# Patient Record
Sex: Male | Born: 1945 | Race: White | Hispanic: No | Marital: Married | Smoking: Former smoker
Health system: Southern US, Community
[De-identification: ages and names within clinical notes are randomized; demographics above are authoritative.]

## PROBLEM LIST (undated history)

## (undated) DIAGNOSIS — K649 Unspecified hemorrhoids: Secondary | ICD-10-CM

## (undated) DIAGNOSIS — T300 Burn of unspecified body region, unspecified degree: Secondary | ICD-10-CM

## (undated) DIAGNOSIS — Z87442 Personal history of urinary calculi: Secondary | ICD-10-CM

## (undated) DIAGNOSIS — G4730 Sleep apnea, unspecified: Secondary | ICD-10-CM

## (undated) DIAGNOSIS — M4310 Spondylolisthesis, site unspecified: Secondary | ICD-10-CM

## (undated) DIAGNOSIS — R112 Nausea with vomiting, unspecified: Secondary | ICD-10-CM

## (undated) DIAGNOSIS — M109 Gout, unspecified: Secondary | ICD-10-CM

## (undated) DIAGNOSIS — Z9889 Other specified postprocedural states: Secondary | ICD-10-CM

## (undated) DIAGNOSIS — C801 Malignant (primary) neoplasm, unspecified: Secondary | ICD-10-CM

## (undated) DIAGNOSIS — R7303 Prediabetes: Secondary | ICD-10-CM

## (undated) DIAGNOSIS — O2340 Unspecified infection of urinary tract in pregnancy, unspecified trimester: Secondary | ICD-10-CM

## (undated) DIAGNOSIS — K219 Gastro-esophageal reflux disease without esophagitis: Secondary | ICD-10-CM

## (undated) DIAGNOSIS — M48061 Spinal stenosis, lumbar region without neurogenic claudication: Secondary | ICD-10-CM

## (undated) DIAGNOSIS — M419 Scoliosis, unspecified: Secondary | ICD-10-CM

## (undated) DIAGNOSIS — N400 Enlarged prostate without lower urinary tract symptoms: Secondary | ICD-10-CM

## (undated) DIAGNOSIS — T8859XA Other complications of anesthesia, initial encounter: Secondary | ICD-10-CM

## (undated) DIAGNOSIS — M719 Bursopathy, unspecified: Secondary | ICD-10-CM

## (undated) DIAGNOSIS — H9190 Unspecified hearing loss, unspecified ear: Secondary | ICD-10-CM

## (undated) DIAGNOSIS — Z8719 Personal history of other diseases of the digestive system: Secondary | ICD-10-CM

## (undated) DIAGNOSIS — M5116 Intervertebral disc disorders with radiculopathy, lumbar region: Secondary | ICD-10-CM

## (undated) DIAGNOSIS — R7302 Impaired glucose tolerance (oral): Secondary | ICD-10-CM

## (undated) DIAGNOSIS — Z86711 Personal history of pulmonary embolism: Secondary | ICD-10-CM

## (undated) DIAGNOSIS — R0602 Shortness of breath: Secondary | ICD-10-CM

## (undated) DIAGNOSIS — M2550 Pain in unspecified joint: Secondary | ICD-10-CM

## (undated) DIAGNOSIS — E669 Obesity, unspecified: Secondary | ICD-10-CM

## (undated) DIAGNOSIS — E785 Hyperlipidemia, unspecified: Secondary | ICD-10-CM

## (undated) DIAGNOSIS — M21379 Foot drop, unspecified foot: Secondary | ICD-10-CM

## (undated) DIAGNOSIS — D4100 Neoplasm of uncertain behavior of unspecified kidney: Secondary | ICD-10-CM

## (undated) DIAGNOSIS — M1990 Unspecified osteoarthritis, unspecified site: Secondary | ICD-10-CM

## (undated) DIAGNOSIS — R350 Frequency of micturition: Secondary | ICD-10-CM

## (undated) DIAGNOSIS — T4145XA Adverse effect of unspecified anesthetic, initial encounter: Secondary | ICD-10-CM

## (undated) DIAGNOSIS — R001 Bradycardia, unspecified: Secondary | ICD-10-CM

## (undated) DIAGNOSIS — N200 Calculus of kidney: Secondary | ICD-10-CM

## (undated) DIAGNOSIS — D4120 Neoplasm of uncertain behavior of unspecified ureter: Secondary | ICD-10-CM

## (undated) DIAGNOSIS — I10 Essential (primary) hypertension: Secondary | ICD-10-CM

## (undated) DIAGNOSIS — G629 Polyneuropathy, unspecified: Secondary | ICD-10-CM

## (undated) HISTORY — PX: HERNIA REPAIR: SHX51

## (undated) HISTORY — PX: TOE AMPUTATION: SHX809

## (undated) HISTORY — PX: TONSILLECTOMY: SUR1361

## (undated) HISTORY — PX: CATARACT EXTRACTION: SUR2

## (undated) HISTORY — PX: SPINAL FUSION: SHX223

## (undated) HISTORY — PX: DIAGNOSTIC LAPAROSCOPY: SUR761

## (undated) HISTORY — PX: LAMINECTOMY: SHX219

## (undated) HISTORY — PX: FEMORAL ARTERY REPAIR: SHX1582

## (undated) HISTORY — PX: LITHOTRIPSY: SUR834

## (undated) HISTORY — PX: OTHER SURGICAL HISTORY: SHX169

## (undated) HISTORY — PX: JOINT REPLACEMENT: SHX530

---

## 2004-06-13 ENCOUNTER — Ambulatory Visit: Payer: Self-pay | Admitting: Family Medicine

## 2005-03-07 ENCOUNTER — Ambulatory Visit: Payer: Self-pay | Admitting: Ophthalmology

## 2005-03-21 ENCOUNTER — Ambulatory Visit: Payer: Self-pay | Admitting: Internal Medicine

## 2005-04-05 ENCOUNTER — Ambulatory Visit: Payer: Self-pay | Admitting: Urology

## 2005-08-01 ENCOUNTER — Ambulatory Visit: Payer: Self-pay | Admitting: Family Medicine

## 2005-09-19 ENCOUNTER — Emergency Department: Payer: Self-pay | Admitting: Emergency Medicine

## 2006-01-23 ENCOUNTER — Other Ambulatory Visit: Payer: Self-pay

## 2006-01-28 ENCOUNTER — Ambulatory Visit: Payer: Self-pay | Admitting: Podiatry

## 2006-12-09 ENCOUNTER — Ambulatory Visit: Payer: Self-pay | Admitting: Podiatry

## 2006-12-09 ENCOUNTER — Other Ambulatory Visit: Payer: Self-pay

## 2006-12-13 ENCOUNTER — Ambulatory Visit: Payer: Self-pay | Admitting: Podiatry

## 2007-06-12 ENCOUNTER — Emergency Department: Payer: Self-pay | Admitting: Internal Medicine

## 2007-09-25 ENCOUNTER — Ambulatory Visit: Payer: Self-pay | Admitting: Gastroenterology

## 2007-10-09 ENCOUNTER — Ambulatory Visit: Payer: Self-pay | Admitting: Podiatry

## 2007-10-22 ENCOUNTER — Ambulatory Visit: Payer: Self-pay | Admitting: Podiatry

## 2008-01-21 ENCOUNTER — Ambulatory Visit: Payer: Self-pay | Admitting: Podiatry

## 2008-01-23 ENCOUNTER — Ambulatory Visit: Payer: Self-pay | Admitting: Podiatry

## 2009-07-11 ENCOUNTER — Ambulatory Visit: Payer: Self-pay | Admitting: Orthopedic Surgery

## 2009-07-14 ENCOUNTER — Ambulatory Visit: Payer: Self-pay | Admitting: Orthopedic Surgery

## 2009-12-17 ENCOUNTER — Emergency Department: Payer: Self-pay | Admitting: Emergency Medicine

## 2009-12-19 ENCOUNTER — Emergency Department: Payer: Self-pay | Admitting: Emergency Medicine

## 2010-07-02 ENCOUNTER — Ambulatory Visit: Payer: Self-pay | Admitting: Rheumatology

## 2010-08-17 ENCOUNTER — Ambulatory Visit: Payer: Self-pay | Admitting: Pain Medicine

## 2010-08-23 ENCOUNTER — Ambulatory Visit: Payer: Self-pay | Admitting: Urology

## 2010-08-24 ENCOUNTER — Ambulatory Visit: Payer: Self-pay | Admitting: Urology

## 2010-09-04 ENCOUNTER — Ambulatory Visit: Payer: Self-pay | Admitting: Pain Medicine

## 2010-09-12 ENCOUNTER — Ambulatory Visit: Payer: Self-pay | Admitting: Pain Medicine

## 2010-09-26 ENCOUNTER — Ambulatory Visit: Payer: Self-pay | Admitting: Pain Medicine

## 2010-10-09 ENCOUNTER — Ambulatory Visit: Payer: Self-pay | Admitting: Pain Medicine

## 2012-02-12 DIAGNOSIS — G621 Alcoholic polyneuropathy: Secondary | ICD-10-CM | POA: Insufficient documentation

## 2012-09-09 ENCOUNTER — Ambulatory Visit: Payer: Self-pay | Admitting: Family Medicine

## 2012-11-06 DIAGNOSIS — K219 Gastro-esophageal reflux disease without esophagitis: Secondary | ICD-10-CM | POA: Insufficient documentation

## 2012-11-11 DIAGNOSIS — M48061 Spinal stenosis, lumbar region without neurogenic claudication: Secondary | ICD-10-CM | POA: Insufficient documentation

## 2013-07-02 ENCOUNTER — Ambulatory Visit: Payer: Self-pay

## 2013-07-02 LAB — GLUCOSE, 2 HOUR
Glucose 2 Hour: 116 mg/dL
Glucose Fasting: 115 mg/dL — ABNORMAL HIGH (ref 70–110)

## 2013-07-02 LAB — TSH: Thyroid Stimulating Horm: 1.94 u[IU]/mL

## 2014-03-25 DIAGNOSIS — M21372 Foot drop, left foot: Secondary | ICD-10-CM | POA: Insufficient documentation

## 2014-04-11 DIAGNOSIS — Z86711 Personal history of pulmonary embolism: Secondary | ICD-10-CM | POA: Insufficient documentation

## 2014-04-11 DIAGNOSIS — E785 Hyperlipidemia, unspecified: Secondary | ICD-10-CM | POA: Insufficient documentation

## 2014-04-11 DIAGNOSIS — M109 Gout, unspecified: Secondary | ICD-10-CM | POA: Insufficient documentation

## 2014-04-11 DIAGNOSIS — N200 Calculus of kidney: Secondary | ICD-10-CM | POA: Insufficient documentation

## 2014-04-11 DIAGNOSIS — N400 Enlarged prostate without lower urinary tract symptoms: Secondary | ICD-10-CM | POA: Diagnosis present

## 2014-04-11 DIAGNOSIS — E538 Deficiency of other specified B group vitamins: Secondary | ICD-10-CM | POA: Insufficient documentation

## 2014-04-11 DIAGNOSIS — I10 Essential (primary) hypertension: Secondary | ICD-10-CM | POA: Diagnosis present

## 2014-09-07 ENCOUNTER — Ambulatory Visit: Payer: Self-pay | Admitting: Family Medicine

## 2015-03-16 DIAGNOSIS — G4733 Obstructive sleep apnea (adult) (pediatric): Secondary | ICD-10-CM | POA: Diagnosis present

## 2015-07-29 ENCOUNTER — Encounter: Payer: Self-pay | Admitting: *Deleted

## 2015-08-01 ENCOUNTER — Ambulatory Visit: Payer: Medicare Other | Admitting: Certified Registered Nurse Anesthetist

## 2015-08-01 ENCOUNTER — Encounter: Payer: Self-pay | Admitting: *Deleted

## 2015-08-01 ENCOUNTER — Encounter
Admission: RE | Disposition: A | Discharge status: Home Health | Payer: Self-pay | Source: Ambulatory Visit | Attending: Gastroenterology

## 2015-08-01 ENCOUNTER — Ambulatory Visit
Admission: RE | Admit: 2015-08-01 | Discharge: 2015-08-01 | Disposition: A | Discharge status: Home Health | Payer: Medicare Other | Source: Ambulatory Visit | Attending: Gastroenterology | Admitting: Gastroenterology

## 2015-08-01 DIAGNOSIS — E785 Hyperlipidemia, unspecified: Secondary | ICD-10-CM | POA: Diagnosis not present

## 2015-08-01 DIAGNOSIS — M419 Scoliosis, unspecified: Secondary | ICD-10-CM | POA: Insufficient documentation

## 2015-08-01 DIAGNOSIS — I10 Essential (primary) hypertension: Secondary | ICD-10-CM | POA: Insufficient documentation

## 2015-08-01 DIAGNOSIS — Z9889 Other specified postprocedural states: Secondary | ICD-10-CM | POA: Diagnosis not present

## 2015-08-01 DIAGNOSIS — K640 First degree hemorrhoids: Secondary | ICD-10-CM | POA: Insufficient documentation

## 2015-08-01 DIAGNOSIS — M1991 Primary osteoarthritis, unspecified site: Secondary | ICD-10-CM | POA: Insufficient documentation

## 2015-08-01 DIAGNOSIS — Z87891 Personal history of nicotine dependence: Secondary | ICD-10-CM | POA: Diagnosis not present

## 2015-08-01 DIAGNOSIS — Z79899 Other long term (current) drug therapy: Secondary | ICD-10-CM | POA: Diagnosis not present

## 2015-08-01 DIAGNOSIS — Z7951 Long term (current) use of inhaled steroids: Secondary | ICD-10-CM | POA: Insufficient documentation

## 2015-08-01 DIAGNOSIS — Z1211 Encounter for screening for malignant neoplasm of colon: Secondary | ICD-10-CM | POA: Insufficient documentation

## 2015-08-01 DIAGNOSIS — K635 Polyp of colon: Secondary | ICD-10-CM | POA: Insufficient documentation

## 2015-08-01 DIAGNOSIS — Z86010 Personal history of colonic polyps: Secondary | ICD-10-CM | POA: Diagnosis present

## 2015-08-01 DIAGNOSIS — E669 Obesity, unspecified: Secondary | ICD-10-CM | POA: Insufficient documentation

## 2015-08-01 DIAGNOSIS — K219 Gastro-esophageal reflux disease without esophagitis: Secondary | ICD-10-CM | POA: Insufficient documentation

## 2015-08-01 DIAGNOSIS — K5730 Diverticulosis of large intestine without perforation or abscess without bleeding: Secondary | ICD-10-CM | POA: Insufficient documentation

## 2015-08-01 DIAGNOSIS — M109 Gout, unspecified: Secondary | ICD-10-CM | POA: Diagnosis not present

## 2015-08-01 DIAGNOSIS — Z981 Arthrodesis status: Secondary | ICD-10-CM | POA: Insufficient documentation

## 2015-08-01 DIAGNOSIS — N400 Enlarged prostate without lower urinary tract symptoms: Secondary | ICD-10-CM | POA: Insufficient documentation

## 2015-08-01 HISTORY — DX: Scoliosis, unspecified: M41.9

## 2015-08-01 HISTORY — PX: COLONOSCOPY WITH PROPOFOL: SHX5780

## 2015-08-01 HISTORY — DX: Gout, unspecified: M10.9

## 2015-08-01 HISTORY — DX: Unspecified hemorrhoids: K64.9

## 2015-08-01 HISTORY — DX: Benign prostatic hyperplasia without lower urinary tract symptoms: N40.0

## 2015-08-01 HISTORY — DX: Obesity, unspecified: E66.9

## 2015-08-01 HISTORY — DX: Essential (primary) hypertension: I10

## 2015-08-01 HISTORY — DX: Unspecified hearing loss, unspecified ear: H91.90

## 2015-08-01 HISTORY — DX: Gastro-esophageal reflux disease without esophagitis: K21.9

## 2015-08-01 HISTORY — DX: Hyperlipidemia, unspecified: E78.5

## 2015-08-01 HISTORY — DX: Unspecified osteoarthritis, unspecified site: M19.90

## 2015-08-01 SURGERY — COLONOSCOPY WITH PROPOFOL
Anesthesia: General

## 2015-08-01 MED ORDER — LIDOCAINE HCL (CARDIAC) 20 MG/ML IV SOLN
INTRAVENOUS | Status: DC | PRN
  Administered 2015-08-01: 40 mg via INTRAVENOUS

## 2015-08-01 MED ORDER — SODIUM CHLORIDE 0.9 % IV SOLN
INTRAVENOUS | Status: DC
  Administered 2015-08-01: 13:00:00 via INTRAVENOUS

## 2015-08-01 MED ORDER — PROPOFOL 10 MG/ML IV BOLUS
INTRAVENOUS | Status: DC | PRN
  Administered 2015-08-01: 10 mg via INTRAVENOUS
  Administered 2015-08-01: 50 mg via INTRAVENOUS

## 2015-08-01 MED ORDER — PROPOFOL 500 MG/50ML IV EMUL
INTRAVENOUS | Status: DC | PRN
  Administered 2015-08-01: 140 ug/kg/min via INTRAVENOUS

## 2015-08-01 NOTE — Anesthesia Preprocedure Evaluation (Addendum)
Anesthesia Evaluation  Patient identified by MRN, date of birth, ID band Patient awake    Reviewed: Allergy & Precautions, NPO status   Airway Mallampati: III  TM Distance: >3 FB Neck ROM: Limited    Dental  (+) Teeth Intact   Pulmonary former smoker,    breath sounds clear to auscultation       Cardiovascular Exercise Tolerance: Good hypertension, Pt. on medications  Rhythm:Regular Rate:Abnormal     Neuro/Psych    GI/Hepatic GERD  Medicated and Controlled,  Endo/Other    Renal/GU      Musculoskeletal  (+) Arthritis , Osteoarthritis,    Abdominal (+) + obese,   Peds  Hematology   Anesthesia Other Findings 114 kg, BMI 37.  Reproductive/Obstetrics                            Anesthesia Physical Anesthesia Plan  ASA: II  Anesthesia Plan: General   Post-op Pain Management:    Induction: Intravenous  Airway Management Planned: Nasal Cannula  Additional Equipment:   Intra-op Plan:   Post-operative Plan:   Informed Consent: I have reviewed the patients History and Physical, chart, labs and discussed the procedure including the risks, benefits and alternatives for the proposed anesthesia with the patient or authorized representative who has indicated his/her understanding and acceptance.     Plan Discussed with: CRNA  Anesthesia Plan Comments:        Anesthesia Quick Evaluation

## 2015-08-01 NOTE — Anesthesia Postprocedure Evaluation (Signed)
Anesthesia Post Note  Patient: Thomas Mcdaniel  Procedure(s) Performed: Procedure(s) (LRB): COLONOSCOPY WITH PROPOFOL (N/A)  Patient location during evaluation: PACU Anesthesia Type: General Level of consciousness: awake Pain management: pain level controlled Vital Signs Assessment: post-procedure vital signs reviewed and stable Respiratory status: spontaneous breathing Cardiovascular status: blood pressure returned to baseline Postop Assessment: no headache Anesthetic complications: no    Last Vitals:  Filed Vitals:   08/01/15 1245 08/01/15 1353  BP: 154/90 90/68  Pulse: 62 74  Temp: 36.6 C 36.2 C  Resp: 14 19    Last Pain: There were no vitals filed for this visit.               Weslyn Holsonback M

## 2015-08-01 NOTE — H&P (Signed)
Primary Care Physician:  Valera Castle, MD  Pre-Procedure History & Physical: HPI:  Thomas Mcdaniel is a 70 y.o. male is here for an colonoscopy.   Past Medical History  Diagnosis Date  . BPH (benign prostatic hyperplasia)   . DJD (degenerative joint disease)   . GERD (gastroesophageal reflux disease)   . Gout   . Hard of hearing   . Hemorrhoids   . Hyperlipidemia   . Hypertension   . Obesity   . Scoliosis     Past Surgical History  Procedure Laterality Date  . Laminectomy    . Femoral artery repair    . Cataract extraction    . Toe amputation    . Spinal fusion    . Tonsillectomy    . Lithotripsy    . Knee arthroscopy      Prior to Admission medications   Medication Sig Start Date End Date Taking? Authorizing Provider  ALPHA LIPOIC ACID PO Take by mouth.   Yes Historical Provider, MD  amLODipine (NORVASC) 10 MG tablet Take 10 mg by mouth daily.   Yes Historical Provider, MD  colchicine 0.6 MG tablet Take 0.6 mg by mouth daily.   Yes Historical Provider, MD  famotidine (PEPCID) 40 MG tablet Take 40 mg by mouth daily.   Yes Historical Provider, MD  finasteride (PROSCAR) 5 MG tablet Take 5 mg by mouth daily.   Yes Historical Provider, MD  lisinopril (PRINIVIL,ZESTRIL) 40 MG tablet Take 40 mg by mouth daily.   Yes Historical Provider, MD  psyllium (METAMUCIL) 58.6 % powder Take 1 packet by mouth 3 (three) times daily.   Yes Historical Provider, MD  vitamin B-12 (CYANOCOBALAMIN) 1000 MCG tablet Take 1,000 mcg by mouth daily.   Yes Historical Provider, MD  fluticasone (FLONASE) 50 MCG/ACT nasal spray Place into both nostrils daily. Reported on 08/01/2015    Historical Provider, MD  Ketoconazole (XOLEGEL EX) Apply topically. Reported on 08/01/2015    Historical Provider, MD    Allergies as of 07/13/2015  . (Not on File)    History reviewed. No pertinent family history.  Social History   Social History  . Marital Status: Married    Spouse Name: N/A  . Number of  Children: N/A  . Years of Education: N/A   Occupational History  . Not on file.   Social History Main Topics  . Smoking status: Former Smoker    Quit date: 07/31/1992  . Smokeless tobacco: Never Used  . Alcohol Use: No  . Drug Use: No  . Sexual Activity: Not on file   Other Topics Concern  . Not on file   Social History Narrative     Physical Exam: BP 154/90 mmHg  Pulse 62  Temp(Src) 97.9 F (36.6 C) (Tympanic)  Resp 14  Ht 5\' 9"  (1.753 m)  Wt 113.399 kg (250 lb)  BMI 36.90 kg/m2  SpO2 97% General:   Alert,  pleasant and cooperative in NAD Head:  Normocephalic and atraumatic. Neck:  Supple; no masses or thyromegaly. Lungs:  Clear throughout to auscultation.    Heart:  Regular rate and rhythm. Abdomen:  Soft, nontender and nondistended. Normal bowel sounds, without guarding, and without rebound.   Neurologic:  Alert and  oriented x4;  grossly normal neurologically.  Impression/Plan: Thomas Mcdaniel is here for an colonoscopy to be performed for pers hx polys  Risks, benefits, limitations, and alternatives regarding  colonoscopy have been reviewed with the patient.  Questions have been answered.  All parties agreeable.   Josefine Class, MD  08/01/2015, 1:20 PM

## 2015-08-01 NOTE — Transfer of Care (Signed)
Immediate Anesthesia Transfer of Care Note  Patient: Thomas Mcdaniel  Procedure(s) Performed: Procedure(s): COLONOSCOPY WITH PROPOFOL (N/A)  Patient Location: PACU  Anesthesia Type:General  Level of Consciousness: sedated  Airway & Oxygen Therapy: Patient Spontanous Breathing and Patient connected to nasal cannula oxygen  Post-op Assessment: Report given to RN and Post -op Vital signs reviewed and stable  Post vital signs: Reviewed and stable  Last Vitals:  Filed Vitals:   08/01/15 1245  BP: 154/90  Pulse: 62  Temp: 36.6 C  Resp: 14    Complications: No apparent anesthesia complications

## 2015-08-01 NOTE — Discharge Instructions (Signed)

## 2015-08-01 NOTE — Anesthesia Procedure Notes (Signed)
Performed by: Lindsey Hommel Pre-anesthesia Checklist: Patient identified, Emergency Drugs available, Suction available, Patient being monitored and Timeout performed Patient Re-evaluated:Patient Re-evaluated prior to inductionOxygen Delivery Method: Nasal cannula Intubation Type: IV induction       

## 2015-08-01 NOTE — Op Note (Signed)
[facility] Patient Name: Thomas Mcdaniel Procedure Date: 08/01/2015 1:18 PM MRN: [id number] Account #: [id number] Date of Birth: 08-08-1945 Admit Type: Outpatient Age: 70 Room: [facility] ENDO ROOM 2 Gender: Male Note Status: Finalized Procedure:         Colonoscopy Indications:       Surveillance: Personal history of colonic polyps (unknown                     histology) on last colonoscopy 5 years ago Patient Profile:   This is a 70 year old male. Providers:         Gerrit Heck. Rayann Heman, MD Referring MD:      Wynona Canes. Kym Groom, MD (Referring MD) Medicines:         Propofol per Anesthesia Complications:     No immediate complications. Procedure:         Pre-Anesthesia Assessment:                    - Prior to the procedure, a History and Physical was                     performed, and patient medications, allergies and                     sensitivities were reviewed. The patient's tolerance of                     previous anesthesia was reviewed.                    After obtaining informed consent, the colonoscope was                     passed under direct vision. Throughout the procedure, the                     patient's blood pressure, pulse, and oxygen saturations                     were monitored continuously. The Olympus CF-H180AL                     colonoscope ( S#: [id number] ) was introduced through the                     anus and advanced to the the cecum, identified by                     appendiceal orifice and ileocecal valve. The colonoscopy                     was performed without difficulty. The patient tolerated                     the procedure well. The quality of the bowel preparation                     was good. Findings:      The perianal and digital rectal examinations were normal.      Two sessile polyps were found in the recto-sigmoid colon. The polyps       were 2 to 3 mm in size. These polyps were removed with a jumbo cold        forceps. Resection and retrieval were complete.  Many small and large-mouthed diverticula were found in the sigmoid colon       and in the descending colon.      Internal hemorrhoids were found during retroflexion. The hemorrhoids       were Grade I (internal hemorrhoids that do not prolapse). Impression:        - Two 2 to 3 mm polyps at the recto-sigmoid colon.                     Resected and retrieved.                    - Diverticulosis in the sigmoid colon and in the                     descending colon.                    - Internal hemorrhoids. Recommendation:    - Observe patient in GI recovery unit.                    - High fiber diet.                    - Continue present medications.                    - Await pathology results.                    - Repeat colonoscopy for surveillance based on pathology                     results.                    - Return to referring physician.                    - The findings and recommendations were discussed with the                     patient.                    - The findings and recommendations were discussed with the                     patient's family. Procedure Code(s): --- Professional ---                    [phone], Colonoscopy, flexible; with biopsy, single or                     multiple Diagnosis Code(s): --- Professional ---                    Z86.010, Personal history of colonic polyps                    D12.7, Benign neoplasm of rectosigmoid junction                    K64.0, First degree hemorrhoids                    K57.30, Diverticulosis of large intestine without                     perforation or abscess without bleeding CPT copyright 2014 [place]  [employer]. All rights reserved. The codes documented in this report are preliminary and upon coder review may  be revised to meet current compliance requirements. Mellody Life, MD 08/01/2015 1:48:53 PM This report has been signed  electronically. Number of Addenda: 0 Note Initiated On: 08/01/2015 1:18 PM Scope Withdrawal Time: 0 hours 10 minutes 0 seconds  Total Procedure Duration: 0 hours 18 minutes 58 seconds       [facility]

## 2015-08-02 ENCOUNTER — Encounter: Payer: Self-pay | Admitting: Gastroenterology

## 2015-08-02 LAB — SURGICAL PATHOLOGY

## 2015-08-22 DIAGNOSIS — R7303 Prediabetes: Secondary | ICD-10-CM | POA: Diagnosis present

## 2015-11-23 DIAGNOSIS — Z01812 Encounter for preprocedural laboratory examination: Secondary | ICD-10-CM

## 2015-11-23 DIAGNOSIS — Z79899 Other long term (current) drug therapy: Secondary | ICD-10-CM | POA: Diagnosis not present

## 2015-11-23 DIAGNOSIS — Z0183 Encounter for blood typing: Secondary | ICD-10-CM | POA: Diagnosis not present

## 2015-11-23 DIAGNOSIS — E785 Hyperlipidemia, unspecified: Secondary | ICD-10-CM | POA: Diagnosis not present

## 2015-11-23 DIAGNOSIS — I10 Essential (primary) hypertension: Secondary | ICD-10-CM

## 2015-11-23 DIAGNOSIS — Z01818 Encounter for other preprocedural examination: Secondary | ICD-10-CM | POA: Diagnosis present

## 2015-11-23 DIAGNOSIS — M1711 Unilateral primary osteoarthritis, right knee: Secondary | ICD-10-CM | POA: Diagnosis not present

## 2015-11-23 DIAGNOSIS — Z86711 Personal history of pulmonary embolism: Secondary | ICD-10-CM | POA: Diagnosis not present

## 2015-11-23 DIAGNOSIS — M109 Gout, unspecified: Secondary | ICD-10-CM | POA: Insufficient documentation

## 2015-11-23 DIAGNOSIS — E669 Obesity, unspecified: Secondary | ICD-10-CM

## 2015-11-23 LAB — URINALYSIS COMPLETE WITH MICROSCOPIC (ARMC ONLY)
BILIRUBIN URINE: NEGATIVE
Bacteria, UA: NONE SEEN
GLUCOSE, UA: NEGATIVE mg/dL
KETONES UR: NEGATIVE mg/dL
Leukocytes, UA: NEGATIVE
NITRITE: NEGATIVE
PH: 5 (ref 5.0–8.0)
Protein, ur: NEGATIVE mg/dL
Specific Gravity, Urine: 1.019 (ref 1.005–1.030)

## 2015-11-23 LAB — SURGICAL PCR SCREEN
MRSA, PCR: NEGATIVE
Staphylococcus aureus: NEGATIVE

## 2015-11-23 LAB — CBC
HEMATOCRIT: 45.1 % (ref 40.0–52.0)
Hemoglobin: 15.4 g/dL (ref 13.0–18.0)
MCH: 31.1 pg (ref 26.0–34.0)
MCHC: 34 g/dL (ref 32.0–36.0)
MCV: 91.3 fL (ref 80.0–100.0)
Platelets: 168 10*3/uL (ref 150–440)
RBC: 4.94 MIL/uL (ref 4.40–5.90)
RDW: 14 % (ref 11.5–14.5)
WBC: 5.6 10*3/uL (ref 3.8–10.6)

## 2015-11-23 LAB — COMPREHENSIVE METABOLIC PANEL
ALK PHOS: 54 U/L (ref 38–126)
ALT: 21 U/L (ref 17–63)
AST: 26 U/L (ref 15–41)
Albumin: 4.2 g/dL (ref 3.5–5.0)
Anion gap: 7 (ref 5–15)
BILIRUBIN TOTAL: 0.6 mg/dL (ref 0.3–1.2)
BUN: 15 mg/dL (ref 6–20)
CALCIUM: 9.6 mg/dL (ref 8.9–10.3)
CO2: 27 mmol/L (ref 22–32)
CREATININE: 0.9 mg/dL (ref 0.61–1.24)
Chloride: 108 mmol/L (ref 101–111)
GFR calc Af Amer: >60 mL/min (ref >60)
Glucose, Bld: 168 mg/dL — ABNORMAL HIGH (ref 65–99)
Potassium: 3.9 mmol/L (ref 3.5–5.1)
Sodium: 142 mmol/L (ref 135–145)
TOTAL PROTEIN: 7 g/dL (ref 6.5–8.1)

## 2015-11-23 LAB — TYPE AND SCREEN
ABO/RH(D): O POS
Antibody Screen: NEGATIVE

## 2015-11-23 LAB — PROTIME-INR
INR: 1.12
PROTHROMBIN TIME: 14.6 s (ref 11.4–15.0)

## 2015-11-23 LAB — SEDIMENTATION RATE: Sed Rate: 4 mm/hr (ref 0–20)

## 2015-11-23 LAB — HEMOGLOBIN A1C: Hgb A1c MFr Bld: 6 % (ref 4.0–6.0)

## 2015-11-23 LAB — APTT: aPTT: 30 seconds (ref 24–36)

## 2015-11-23 LAB — ABO/RH: ABO/RH(D): O POS

## 2015-11-23 NOTE — Patient Instructions (Signed)
Your procedure is scheduled [id number] 12/05/15 Report to Day Surgery. 2ND FLOOR MEDICAL MALL ENTRANCE To find out your arrival time please call [phone] between 1PM - 3PM on Friday 12/02/15.  Remember: Instructions that are not followed completely may result in serious medical risk, up to and including death, or upon the discretion of your surgeon and anesthesiologist your surgery may need to be rescheduled.    __X__ 1. Do not eat food or drink liquids after midnight. No gum chewing or hard candies.     __X__ 2. No Alcohol for 24 hours before or after surgery.   ____ 3. Bring all medications with you on the day of surgery if instructed.    __X__ 4. Notify your doctor if there is any change in your medical condition     (cold, fever, infections).     Do not wear jewelry, make-up, hairpins, clips or nail polish.  Do not wear lotions, powders, or perfumes.   Do not shave 48 hours prior to surgery. Men may shave face and neck.  Do not bring valuables to the hospital.    [facility] is not responsible for any belongings or valuables.               Contacts, dentures or bridgework may not be worn into surgery.  Leave your suitcase in the car. After surgery it may be brought to your room.  For patients admitted to the hospital, discharge time is determined by your                treatment team.   Patients discharged the day of surgery will not be allowed to drive home.   Please read over the following fact sheets that you were given:   MRSA Information and Surgical Site Infection Prevention   __X__ Take these medicines the morning of surgery with A SIP OF WATER:    1. AMLODIPINE  2. LISINOPRIL  3.   4.  5.  6.  ____ Fleet Enema (as directed)   __X__ Use CHG Soap as directed  ____ Use inhalers on the day of surgery  ____ Stop metformin 2 days prior to surgery    ____ Take 1/2 of usual insulin dose the night before surgery and none on the morning of surgery.   ____ Stop  Coumadin/Plavix/aspirin on   ____ Stop Anti-inflammatories on    __X__ Stop supplements until after surgery.  STOP B12 7 DAYS BEFORE  SURGERY  ____ Bring C-Pap to the hospital.

## 2015-11-24 LAB — URINE CULTURE: Special Requests: NORMAL

## 2015-11-24 NOTE — Pre-Procedure Instructions (Signed)
Urine culture results note multiple species present recollection suggested. Patient contacted notified recollection needed. He stated he would likely be able to return 11/25/15 in the am for recollection.

## 2015-11-25 ENCOUNTER — Encounter
Admission: RE | Admit: 2015-11-25 | Discharge: 2015-11-25 | Disposition: A | Discharge status: Home / Self Care | Payer: Medicare Other | Source: Ambulatory Visit | Attending: Orthopedic Surgery | Admitting: Orthopedic Surgery

## 2015-11-25 DIAGNOSIS — I10 Essential (primary) hypertension: Secondary | ICD-10-CM | POA: Insufficient documentation

## 2015-11-25 DIAGNOSIS — Z86711 Personal history of pulmonary embolism: Secondary | ICD-10-CM | POA: Insufficient documentation

## 2015-11-25 DIAGNOSIS — Z01818 Encounter for other preprocedural examination: Secondary | ICD-10-CM | POA: Diagnosis not present

## 2015-11-25 DIAGNOSIS — Z79899 Other long term (current) drug therapy: Secondary | ICD-10-CM | POA: Insufficient documentation

## 2015-11-25 DIAGNOSIS — E785 Hyperlipidemia, unspecified: Secondary | ICD-10-CM | POA: Insufficient documentation

## 2015-11-25 DIAGNOSIS — M1711 Unilateral primary osteoarthritis, right knee: Secondary | ICD-10-CM | POA: Insufficient documentation

## 2015-11-25 DIAGNOSIS — Z01812 Encounter for preprocedural laboratory examination: Secondary | ICD-10-CM | POA: Insufficient documentation

## 2015-11-25 DIAGNOSIS — Z0183 Encounter for blood typing: Secondary | ICD-10-CM | POA: Insufficient documentation

## 2015-11-25 HISTORY — DX: Impaired glucose tolerance (oral): R73.02

## 2015-11-25 HISTORY — DX: Calculus of kidney: N20.0

## 2015-11-25 HISTORY — DX: Other specified postprocedural states: R11.2

## 2015-11-25 HISTORY — DX: Adverse effect of unspecified anesthetic, initial encounter: T41.45XA

## 2015-11-25 HISTORY — DX: Personal history of pulmonary embolism: Z86.711

## 2015-11-25 HISTORY — DX: Other complications of anesthesia, initial encounter: T88.59XA

## 2015-11-25 HISTORY — DX: Other specified postprocedural states: Z98.890

## 2015-11-25 HISTORY — DX: Spinal stenosis, lumbar region without neurogenic claudication: M48.061

## 2015-11-26 LAB — URINE CULTURE: CULTURE: NO GROWTH

## 2015-12-05 ENCOUNTER — Encounter: Payer: Self-pay | Admitting: *Deleted

## 2015-12-05 ENCOUNTER — Inpatient Hospital Stay: Payer: Medicare Other | Admitting: Certified Registered Nurse Anesthetist

## 2015-12-05 ENCOUNTER — Encounter
Admission: RE | Disposition: A | Discharge status: Skilled Nursing Facility | Payer: Self-pay | Source: Ambulatory Visit | Attending: Orthopedic Surgery

## 2015-12-05 ENCOUNTER — Inpatient Hospital Stay
Admission: RE | Admit: 2015-12-05 | Discharge: 2015-12-08 | DRG: 470 | Disposition: A | Discharge status: Skilled Nursing Facility | Payer: Medicare Other | Source: Ambulatory Visit | Attending: Orthopedic Surgery | Admitting: Orthopedic Surgery

## 2015-12-05 ENCOUNTER — Inpatient Hospital Stay: Payer: Medicare Other

## 2015-12-05 DIAGNOSIS — H9190 Unspecified hearing loss, unspecified ear: Secondary | ICD-10-CM | POA: Diagnosis present

## 2015-12-05 DIAGNOSIS — I10 Essential (primary) hypertension: Secondary | ICD-10-CM | POA: Diagnosis present

## 2015-12-05 DIAGNOSIS — M25761 Osteophyte, right knee: Secondary | ICD-10-CM | POA: Diagnosis present

## 2015-12-05 DIAGNOSIS — Z96659 Presence of unspecified artificial knee joint: Secondary | ICD-10-CM

## 2015-12-05 DIAGNOSIS — M1711 Unilateral primary osteoarthritis, right knee: Secondary | ICD-10-CM | POA: Diagnosis present

## 2015-12-05 DIAGNOSIS — Z87891 Personal history of nicotine dependence: Secondary | ICD-10-CM

## 2015-12-05 DIAGNOSIS — E669 Obesity, unspecified: Secondary | ICD-10-CM | POA: Diagnosis present

## 2015-12-05 DIAGNOSIS — K219 Gastro-esophageal reflux disease without esophagitis: Secondary | ICD-10-CM | POA: Diagnosis present

## 2015-12-05 DIAGNOSIS — Z6836 Body mass index (BMI) 36.0-36.9, adult: Secondary | ICD-10-CM

## 2015-12-05 DIAGNOSIS — M25461 Effusion, right knee: Secondary | ICD-10-CM

## 2015-12-05 DIAGNOSIS — Z96651 Presence of right artificial knee joint: Secondary | ICD-10-CM

## 2015-12-05 DIAGNOSIS — M25561 Pain in right knee: Secondary | ICD-10-CM

## 2015-12-05 HISTORY — PX: KNEE ARTHROPLASTY: SHX992

## 2015-12-05 SURGERY — ARTHROPLASTY, KNEE, TOTAL, USING IMAGELESS COMPUTER-ASSISTED NAVIGATION
Anesthesia: Spinal | Site: Knee | Laterality: Right | Wound class: Clean

## 2015-12-05 MED ORDER — COLCHICINE 0.6 MG PO TABS: 0.6000 mg | ORAL_TABLET | Freq: Two times a day (BID) | ORAL | Status: DC | PRN

## 2015-12-05 MED ORDER — BUPIVACAINE-EPINEPHRINE (PF) 0.25% -1:200000 IJ SOLN
INTRAMUSCULAR | Status: DC | PRN
  Administered 2015-12-05: 30 mL via PERINEURAL

## 2015-12-05 MED ORDER — TETRACAINE HCL 1 % IJ SOLN
INTRAMUSCULAR | Status: DC | PRN
  Administered 2015-12-05: .8 mg via INTRASPINAL

## 2015-12-05 MED ORDER — PANTOPRAZOLE SODIUM 40 MG PO TBEC
40.0000 mg | DELAYED_RELEASE_TABLET | Freq: Two times a day (BID) | ORAL | Status: DC
Start: 2015-12-05 — End: 2015-12-08
  Administered 2015-12-05 – 2015-12-08 (×7): 40 mg via ORAL
  Filled 2015-12-05 (×7): qty 1

## 2015-12-05 MED ORDER — FENTANYL CITRATE (PF) 100 MCG/2ML IJ SOLN
INTRAMUSCULAR | Status: AC
  Filled 2015-12-05: qty 2

## 2015-12-05 MED ORDER — BUPIVACAINE LIPOSOME 1.3 % IJ SUSP
INTRAMUSCULAR | Status: AC
  Filled 2015-12-05: qty 20

## 2015-12-05 MED ORDER — MENTHOL 3 MG MT LOZG: 1.0000 | LOZENGE | OROMUCOSAL | Status: DC | PRN

## 2015-12-05 MED ORDER — DEXAMETHASONE SODIUM PHOSPHATE 10 MG/ML IJ SOLN
INTRAMUSCULAR | Status: DC | PRN
  Administered 2015-12-05: 10 mg via INTRAVENOUS

## 2015-12-05 MED ORDER — ONDANSETRON HCL 4 MG/2ML IJ SOLN: 4.0000 mg | Freq: Once | INTRAMUSCULAR | Status: DC | PRN

## 2015-12-05 MED ORDER — TRAMADOL HCL 50 MG PO TABS
50.0000 mg | ORAL_TABLET | ORAL | Status: DC | PRN
  Administered 2015-12-05 – 2015-12-06 (×3): 50 mg via ORAL
  Administered 2015-12-07 (×2): 100 mg via ORAL
  Filled 2015-12-05 (×4): qty 1
  Filled 2015-12-05: qty 2
  Filled 2015-12-05: qty 1
  Filled 2015-12-05: qty 2

## 2015-12-05 MED ORDER — LACTATED RINGERS IV SOLN
INTRAVENOUS | Status: DC
  Administered 2015-12-05: 06:00:00 via INTRAVENOUS

## 2015-12-05 MED ORDER — LIDOCAINE HCL (CARDIAC) 20 MG/ML IV SOLN
INTRAVENOUS | Status: DC | PRN
  Administered 2015-12-05: 50 mg via INTRAVENOUS

## 2015-12-05 MED ORDER — SODIUM CHLORIDE 0.9 % IJ SOLN
INTRAMUSCULAR | Status: AC
  Filled 2015-12-05: qty 50

## 2015-12-05 MED ORDER — ONDANSETRON HCL 4 MG/2ML IJ SOLN: 4.0000 mg | Freq: Four times a day (QID) | INTRAMUSCULAR | Status: DC | PRN

## 2015-12-05 MED ORDER — OXYCODONE HCL 5 MG PO TABS
5.0000 mg | ORAL_TABLET | ORAL | Status: DC | PRN
  Administered 2015-12-05 – 2015-12-06 (×4): 5 mg via ORAL
  Administered 2015-12-06 – 2015-12-08 (×6): 10 mg via ORAL
  Administered 2015-12-08: 5 mg via ORAL
  Administered 2015-12-08: 10 mg via ORAL
  Filled 2015-12-05: qty 1
  Filled 2015-12-05: qty 2
  Filled 2015-12-05: qty 1
  Filled 2015-12-05 (×3): qty 2
  Filled 2015-12-05: qty 1
  Filled 2015-12-05: qty 2
  Filled 2015-12-05 (×2): qty 1
  Filled 2015-12-05: qty 2

## 2015-12-05 MED ORDER — KETAMINE HCL 50 MG/ML IJ SOLN
INTRAMUSCULAR | Status: DC | PRN
  Administered 2015-12-05 (×2): 50 mg via INTRAMUSCULAR

## 2015-12-05 MED ORDER — CEFAZOLIN SODIUM-DEXTROSE 2-4 GM/100ML-% IV SOLN
2.0000 g | Freq: Four times a day (QID) | INTRAVENOUS | Status: AC
  Administered 2015-12-05 – 2015-12-06 (×4): 2 g via INTRAVENOUS
  Filled 2015-12-05 (×4): qty 100

## 2015-12-05 MED ORDER — PHENOL 1.4 % MT LIQD: 1.0000 | OROMUCOSAL | Status: DC | PRN

## 2015-12-05 MED ORDER — NEOMYCIN-POLYMYXIN B GU 40-200000 IR SOLN
Status: AC
  Filled 2015-12-05: qty 20

## 2015-12-05 MED ORDER — NEOMYCIN-POLYMYXIN B GU 40-200000 IR SOLN
Status: DC | PRN
  Administered 2015-12-05: 14 mL

## 2015-12-05 MED ORDER — FENTANYL CITRATE (PF) 100 MCG/2ML IJ SOLN
INTRAMUSCULAR | Status: DC | PRN
  Administered 2015-12-05 (×2): 25 ug via INTRAVENOUS
  Administered 2015-12-05: 50 ug via INTRAVENOUS

## 2015-12-05 MED ORDER — BISACODYL 10 MG RE SUPP
10.0000 mg | Freq: Every day | RECTAL | Status: DC | PRN
  Administered 2015-12-07: 10 mg via RECTAL
  Filled 2015-12-05: qty 1

## 2015-12-05 MED ORDER — ACETAMINOPHEN 10 MG/ML IV SOLN
INTRAVENOUS | Status: DC | PRN
  Administered 2015-12-05: 1000 mg via INTRAVENOUS

## 2015-12-05 MED ORDER — ALUM & MAG HYDROXIDE-SIMETH 200-200-20 MG/5ML PO SUSP
30.0000 mL | ORAL | Status: DC | PRN
  Administered 2015-12-07: 30 mL via ORAL
  Filled 2015-12-05: qty 30

## 2015-12-05 MED ORDER — MORPHINE SULFATE (PF) 2 MG/ML IV SOLN
2.0000 mg | INTRAVENOUS | Status: DC | PRN
  Administered 2015-12-05: 2 mg via INTRAVENOUS
  Filled 2015-12-05: qty 1

## 2015-12-05 MED ORDER — MIDAZOLAM HCL 5 MG/5ML IJ SOLN
INTRAMUSCULAR | Status: DC | PRN
  Administered 2015-12-05 (×2): 1 mg via INTRAVENOUS

## 2015-12-05 MED ORDER — PROPOFOL 500 MG/50ML IV EMUL
INTRAVENOUS | Status: DC | PRN
  Administered 2015-12-05: 75 ug/kg/min via INTRAVENOUS

## 2015-12-05 MED ORDER — DIPHENHYDRAMINE HCL 12.5 MG/5ML PO ELIX: 12.5000 mg | ORAL_SOLUTION | ORAL | Status: DC | PRN

## 2015-12-05 MED ORDER — VITAMIN B-12 1000 MCG PO TABS
1000.0000 ug | ORAL_TABLET | Freq: Every day | ORAL | Status: DC
  Administered 2015-12-06 – 2015-12-08 (×3): 1000 ug via ORAL
  Filled 2015-12-05 (×3): qty 1

## 2015-12-05 MED ORDER — SODIUM CHLORIDE 0.9 % IV SOLN
Freq: Once | INTRAVENOUS | Status: AC
  Administered 2015-12-05: 14:00:00 via INTRAVENOUS

## 2015-12-05 MED ORDER — SENNOSIDES-DOCUSATE SODIUM 8.6-50 MG PO TABS
1.0000 | ORAL_TABLET | Freq: Two times a day (BID) | ORAL | Status: DC
  Administered 2015-12-05 – 2015-12-08 (×6): 1 via ORAL
  Filled 2015-12-05 (×6): qty 1

## 2015-12-05 MED ORDER — ENOXAPARIN SODIUM 30 MG/0.3ML ~~LOC~~ SOLN
30.0000 mg | Freq: Two times a day (BID) | SUBCUTANEOUS | Status: DC
  Administered 2015-12-06 – 2015-12-08 (×5): 30 mg via SUBCUTANEOUS
  Filled 2015-12-05 (×5): qty 0.3

## 2015-12-05 MED ORDER — AMLODIPINE BESYLATE 10 MG PO TABS
10.0000 mg | ORAL_TABLET | Freq: Every day | ORAL | Status: DC
  Administered 2015-12-06 – 2015-12-08 (×3): 10 mg via ORAL
  Filled 2015-12-05 (×3): qty 1

## 2015-12-05 MED ORDER — ACETAMINOPHEN 650 MG RE SUPP: 650.0000 mg | Freq: Four times a day (QID) | RECTAL | Status: DC | PRN

## 2015-12-05 MED ORDER — CHLORHEXIDINE GLUCONATE 4 % EX LIQD: 60.0000 mL | Freq: Once | CUTANEOUS | Status: DC

## 2015-12-05 MED ORDER — MAGNESIUM HYDROXIDE 400 MG/5ML PO SUSP
30.0000 mL | Freq: Every day | ORAL | Status: DC | PRN
  Administered 2015-12-06: 30 mL via ORAL
  Filled 2015-12-05: qty 30

## 2015-12-05 MED ORDER — SODIUM CHLORIDE 0.9 % IV SOLN
Freq: Once | INTRAVENOUS | Status: AC
Start: 2015-12-05 — End: 2015-12-05
  Administered 2015-12-05: 18:00:00 via INTRAVENOUS

## 2015-12-05 MED ORDER — METOCLOPRAMIDE HCL 10 MG PO TABS
10.0000 mg | ORAL_TABLET | Freq: Three times a day (TID) | ORAL | Status: AC
  Administered 2015-12-05 – 2015-12-07 (×8): 10 mg via ORAL
  Filled 2015-12-05 (×8): qty 1

## 2015-12-05 MED ORDER — SODIUM CHLORIDE 0.9 % IV SOLN
INTRAVENOUS | Status: DC
  Administered 2015-12-05: 13:00:00 via INTRAVENOUS

## 2015-12-05 MED ORDER — FENTANYL CITRATE (PF) 100 MCG/2ML IJ SOLN
25.0000 ug | INTRAMUSCULAR | Status: DC | PRN
  Administered 2015-12-05 (×3): 25 ug via INTRAVENOUS

## 2015-12-05 MED ORDER — FAMOTIDINE 20 MG PO TABS
20.0000 mg | ORAL_TABLET | Freq: Once | ORAL | Status: AC
  Administered 2015-12-05: 20 mg via ORAL

## 2015-12-05 MED ORDER — FERROUS SULFATE 325 (65 FE) MG PO TABS
325.0000 mg | ORAL_TABLET | Freq: Two times a day (BID) | ORAL | Status: DC
  Administered 2015-12-05 – 2015-12-08 (×7): 325 mg via ORAL
  Filled 2015-12-05 (×7): qty 1

## 2015-12-05 MED ORDER — ACETAMINOPHEN 10 MG/ML IV SOLN
1000.0000 mg | Freq: Four times a day (QID) | INTRAVENOUS | Status: AC
  Administered 2015-12-05 – 2015-12-06 (×4): 1000 mg via INTRAVENOUS
  Filled 2015-12-05 (×4): qty 100

## 2015-12-05 MED ORDER — CEFAZOLIN SODIUM-DEXTROSE 2-4 GM/100ML-% IV SOLN
2.0000 g | INTRAVENOUS | Status: AC
  Administered 2015-12-05: 2 g via INTRAVENOUS

## 2015-12-05 MED ORDER — BUPIVACAINE HCL (PF) 0.5 % IJ SOLN
INTRAMUSCULAR | Status: DC | PRN
  Administered 2015-12-05: 2.4 mL

## 2015-12-05 MED ORDER — FAMOTIDINE 20 MG PO TABS
ORAL_TABLET | ORAL | Status: AC
  Filled 2015-12-05: qty 1

## 2015-12-05 MED ORDER — PHENYLEPHRINE HCL 10 MG/ML IJ SOLN
INTRAMUSCULAR | Status: DC | PRN
  Administered 2015-12-05 (×4): 100 ug via INTRAVENOUS

## 2015-12-05 MED ORDER — LISINOPRIL 20 MG PO TABS
40.0000 mg | ORAL_TABLET | Freq: Every day | ORAL | Status: DC
  Administered 2015-12-06 – 2015-12-08 (×3): 40 mg via ORAL
  Filled 2015-12-05 (×3): qty 2

## 2015-12-05 MED ORDER — CEFAZOLIN SODIUM-DEXTROSE 2-4 GM/100ML-% IV SOLN
INTRAVENOUS | Status: AC
  Filled 2015-12-05: qty 100

## 2015-12-05 MED ORDER — PROPOFOL 10 MG/ML IV BOLUS
INTRAVENOUS | Status: DC | PRN
  Administered 2015-12-05 (×2): 20 mg via INTRAVENOUS

## 2015-12-05 MED ORDER — ACETAMINOPHEN 10 MG/ML IV SOLN
INTRAVENOUS | Status: AC
  Filled 2015-12-05: qty 100

## 2015-12-05 MED ORDER — BUPIVACAINE-EPINEPHRINE (PF) 0.25% -1:200000 IJ SOLN
INTRAMUSCULAR | Status: AC
  Filled 2015-12-05: qty 30

## 2015-12-05 MED ORDER — TETRACAINE HCL 1 % IJ SOLN
INTRAMUSCULAR | Status: AC
  Filled 2015-12-05: qty 2

## 2015-12-05 MED ORDER — ONDANSETRON HCL 4 MG PO TABS: 4.0000 mg | ORAL_TABLET | Freq: Four times a day (QID) | ORAL | Status: DC | PRN

## 2015-12-05 MED ORDER — FLEET ENEMA 7-19 GM/118ML RE ENEM: 1.0000 | ENEMA | Freq: Once | RECTAL | Status: DC | PRN

## 2015-12-05 MED ORDER — ACETAMINOPHEN 325 MG PO TABS
650.0000 mg | ORAL_TABLET | Freq: Four times a day (QID) | ORAL | Status: DC | PRN
  Administered 2015-12-08: 650 mg via ORAL
  Filled 2015-12-05: qty 2

## 2015-12-05 MED ORDER — SODIUM CHLORIDE 0.9 % IV SOLN
INTRAVENOUS | Status: DC | PRN
  Administered 2015-12-05: 60 mL

## 2015-12-05 MED ORDER — ONDANSETRON HCL 4 MG/2ML IJ SOLN
INTRAMUSCULAR | Status: DC | PRN
  Administered 2015-12-05: 4 mg via INTRAVENOUS

## 2015-12-05 SURGICAL SUPPLY — 58 items
AUTOTRANSFUS HAS 1/8 (MISCELLANEOUS) ×3
BATTERY INSTRU NAVIGATION (MISCELLANEOUS) ×12 IMPLANT
BLADE SAW 1 (BLADE) ×3 IMPLANT
BLADE SAW 1/2 (BLADE) ×3 IMPLANT
CANISTER SUCT 1200ML W/VALVE (MISCELLANEOUS) ×3 IMPLANT
CANISTER SUCT 3000ML (MISCELLANEOUS) ×6 IMPLANT
CAPT KNEE TOTAL 3 ATTUNE ×3 IMPLANT
CATH TRAY METER 16FR LF (MISCELLANEOUS) ×3 IMPLANT
CEMENT HV SMART SET (Cement) ×6 IMPLANT
COOLER POLAR GLACIER W/PUMP (MISCELLANEOUS) ×3 IMPLANT
CUFF TOURN 24 STER (MISCELLANEOUS) IMPLANT
CUFF TOURN 30 STER DUAL PORT (MISCELLANEOUS) ×3 IMPLANT
DRAPE SHEET LG 3/4 BI-LAMINATE (DRAPES) ×9 IMPLANT
DRSG DERMACEA 8X12 NADH (GAUZE/BANDAGES/DRESSINGS) ×3 IMPLANT
DRSG OPSITE POSTOP 4X14 (GAUZE/BANDAGES/DRESSINGS) ×3 IMPLANT
DRSG TEGADERM 4X4.75 (GAUZE/BANDAGES/DRESSINGS) ×3 IMPLANT
DURAPREP 26ML APPLICATOR (WOUND CARE) ×6 IMPLANT
ELECT CAUTERY BLADE 6.4 (BLADE) ×3 IMPLANT
ELECT REM PT RETURN 9FT ADLT (ELECTROSURGICAL) ×3
ELECTRODE REM PT RTRN 9FT ADLT (ELECTROSURGICAL) ×1 IMPLANT
EX-PIN ORTHOLOCK NAV 4X150 (PIN) ×6 IMPLANT
GLOVE BIOGEL M STRL SZ7.5 (GLOVE) ×6 IMPLANT
GLOVE INDICATOR 8.0 STRL GRN (GLOVE) ×3 IMPLANT
GLOVE SURG 9.0 ORTHO LTXF (GLOVE) ×3 IMPLANT
GLOVE SURG ORTHO 9.0 STRL STRW (GLOVE) ×3 IMPLANT
GOWN STRL REUS W/ TWL LRG LVL3 (GOWN DISPOSABLE) ×2 IMPLANT
GOWN STRL REUS W/TWL 2XL LVL3 (GOWN DISPOSABLE) ×3 IMPLANT
GOWN STRL REUS W/TWL LRG LVL3 (GOWN DISPOSABLE) ×4
HANDPIECE SUCTION TUBG SURGILV (MISCELLANEOUS) ×3 IMPLANT
HOLDER FOLEY CATH W/STRAP (MISCELLANEOUS) ×3 IMPLANT
HOOD PEEL AWAY FLYTE STAYCOOL (MISCELLANEOUS) ×6 IMPLANT
KIT RM TURNOVER STRD PROC AR (KITS) ×3 IMPLANT
KNIFE SCULPS 14X20 (INSTRUMENTS) ×3 IMPLANT
NDL SAFETY 18GX1.5 (NEEDLE) ×3 IMPLANT
NEEDLE SPNL 20GX3.5 QUINCKE YW (NEEDLE) ×3 IMPLANT
NS IRRIG 500ML POUR BTL (IV SOLUTION) ×3 IMPLANT
PACK TOTAL KNEE (MISCELLANEOUS) ×3 IMPLANT
PAD WRAPON POLAR KNEE (MISCELLANEOUS) ×1 IMPLANT
PIN DRILL QUICK PACK ×3 IMPLANT
PIN FIXATION 1/8DIA X 3INL (PIN) ×3 IMPLANT
SOL .9 NS 3000ML IRR  AL (IV SOLUTION) ×2
SOL .9 NS 3000ML IRR UROMATIC (IV SOLUTION) ×1 IMPLANT
SOL PREP PVP 2OZ (MISCELLANEOUS) ×3
SOLUTION PREP PVP 2OZ (MISCELLANEOUS) ×1 IMPLANT
SPONGE DRAIN TRACH 4X4 STRL 2S (GAUZE/BANDAGES/DRESSINGS) ×3 IMPLANT
STAPLER SKIN PROX 35W (STAPLE) ×3 IMPLANT
SUCTION FRAZIER HANDLE 10FR (MISCELLANEOUS) ×2
SUCTION TUBE FRAZIER 10FR DISP (MISCELLANEOUS) ×1 IMPLANT
SUT VIC AB 0 CT1 36 (SUTURE) ×3 IMPLANT
SUT VIC AB 1 CT1 36 (SUTURE) ×6 IMPLANT
SUT VIC AB 2-0 CT2 27 (SUTURE) ×3 IMPLANT
SYR 20CC LL (SYRINGE) ×3 IMPLANT
SYR 30ML LL (SYRINGE) ×3 IMPLANT
SYR 50ML LL SCALE MARK (SYRINGE) ×3 IMPLANT
SYSTEM AUTOTRANSFUS DUAL TROCR (MISCELLANEOUS) ×1 IMPLANT
TOWEL OR 17X26 4PK STRL BLUE (TOWEL DISPOSABLE) ×3 IMPLANT
TOWER CARTRIDGE SMART MIX (DISPOSABLE) ×3 IMPLANT
WRAPON POLAR PAD KNEE (MISCELLANEOUS) ×3

## 2015-12-05 NOTE — Transfer of Care (Signed)
Immediate Anesthesia Transfer of Care Note  Patient: Thomas Mcdaniel  Procedure(s) Performed: Procedure(s): COMPUTER ASSISTED TOTAL KNEE ARTHROPLASTY (Right)  Patient Location: PACU  Anesthesia Type:Spinal  Level of Consciousness: sedated  Airway & Oxygen Therapy: Patient Spontanous Breathing and Patient connected to face mask oxygen  Post-op Assessment: Report given to RN and Post -op Vital signs reviewed and stable  Post vital signs: Reviewed and stable  Last Vitals:  Filed Vitals:   12/05/15 0555 12/05/15 1116  BP: 148/83 128/86  Pulse: 63 79  Temp: 36.6 C 37.6 C  Resp: 16 16    Last Pain: There were no vitals filed for this visit.       Complications: No apparent anesthesia complications

## 2015-12-05 NOTE — Evaluation (Signed)
Physical Therapy Evaluation Patient Details Name: Thomas Mcdaniel MRN: [id number] DOB: [birth year] Today's Date: 12/05/2015   History of Present Illness  Pt underwent R TKR without reported post-op complications. He reports return of sensation to RLE at time of evaluation. Pt reports history of chronic bilateral LE neuropathy as well as chronic R foot drop  Clinical Impression  Pt demonstrates excellent function with PT evaluation on POD#0. He is abel to perform bed mobility, transfers, and limited ambulation from bed to recliner with CGA only. Pt reports no increase in pain with mobility. He is able to complete all bed exercises as instructed. Pt with chronic R foot drop. Discussed use of AFO and pt reports he may be fitted tomorrow for a new one. This would certainly help with safety during ambulation if available. Pt should be safe to return home at discharge with [facility] PT. Pt will benefit from skilled PT services to address deficits in strength, balance, and mobility in order to return to full function at home.     Follow Up Recommendations Home health PT    Equipment Recommendations  None recommended by PT    Recommendations for Other Services       Precautions / Restrictions Precautions Precautions: Knee;Fall Precaution Booklet Issued: Yes (comment) Required Braces or Orthoses: Knee Immobilizer - Right Knee Immobilizer - Right: Discontinue once straight leg raise with < 10 degree lag Restrictions Weight Bearing Restrictions: Yes RLE Weight Bearing: Weight bearing as tolerated      Mobility  Bed Mobility Overal bed mobility: Needs Assistance Bed Mobility: Supine to Sit     Supine to sit: Min guard     General bed mobility comments: Initially provided assist for eccentric lowering of R foot at EOB but pt able to take over full control. Utilizes bed rails and HOB elevated.  Transfers Overall transfer level: Needs assistance Equipment used: Rolling walker (2  wheeled) Transfers: Sit to/from Stand Sit to Stand: Min guard         General transfer comment: Pt requires cues for safe hand placement. He is able to come to standing with decreased weight shift to RLE. Once upright he is steady and stable in standing with UE support  Ambulation/Gait Ambulation/Gait assistance: Min guard Ambulation Distance (Feet): 3 Feet Assistive device: Rolling walker (2 wheeled) Gait Pattern/deviations: Step-to pattern;Decreased step length - left;Decreased stance time - right;Decreased weight shift to right;Decreased dorsiflexion - right Gait velocity: Decreased Gait velocity interpretation: <1.8 ft/sec, indicative of risk for recurrent falls General Gait Details: Pt able to take short steps from bed to recliner with walker. He is steady and stable with use of rolling walker. Decreased weight acceptance to RLE  Stairs            Wheelchair Mobility    Modified Rankin (Stroke Patients Only)       Balance Overall balance assessment: Needs assistance Sitting-balance support: No upper extremity supported Sitting balance-Leahy Scale: Good     Standing balance support: Bilateral upper extremity supported Standing balance-Leahy Scale: Poor Standing balance comment: Requiring UE support in standing at this time                             Pertinent Vitals/Pain Pain Assessment: 0-10 Pain Score: 3  Pain Location: R knee Pain Descriptors / Indicators: Aching Pain Intervention(s): Monitored during session;Premedicated before session;Limited activity within patient's tolerance    Home Living Family/patient expects to be discharged to:: Private residence  Living Arrangements: Spouse/significant other Available Help at Discharge: Family Type of Home: House Home Access: Level entry     Home Layout: One level Home Equipment: Environmental consultant - 2 wheels;Cane - single point;Bedside commode;Grab bars - tub/shower      Prior Function Level of  Independence: Independent with assistive device(s)         Comments: Pt reports use of single point cane for limited community mobility. Infrequent use of rolling walker     Hand Dominance   Dominant Hand: Right    Extremity/Trunk Assessment   Upper Extremity Assessment: Overall WFL for tasks assessed           Lower Extremity Assessment: RLE deficits/detail RLE Deficits / Details: Pt demonstrates LLE strength which is grossly WFL. R ankle dorsiflexion is 1+/5 due to chronic R foot drop. Decreased R foot sensation secondary to neuropathy. Post-surgical changes in sensation appear to be resolved. Pt requires 2 finger assist for SLR, independent SAQ. Plantarflexion strength on RLE is intact. Elected not to utilize [employer] as pt only requires minimal 2 finger assist with SLR. With history of foot drop he is at increased risk for falls with R knee locked in extension. Pt will need to be able to flex R knee to clear toes       Communication   Communication: No difficulties  Cognition Arousal/Alertness: Awake/alert Behavior During Therapy: WFL for tasks assessed/performed Overall Cognitive Status: Within Functional Limits for tasks assessed                      General Comments      Exercises Total Joint Exercises Ankle Circles/Pumps: Strengthening;Both;10 reps;Supine;Other (comment) (Plantarflexion only on RLE due to chronic foot drop) Quad Sets: Strengthening;Both;10 reps;Supine Gluteal Sets: Strengthening;Both;10 reps;Supine Towel Squeeze: Strengthening;Both;10 reps;Supine Short Arc Quad: Strengthening;Right;10 reps;Supine Heel Slides: Strengthening;Right;10 reps;Supine Hip ABduction/ADduction: Strengthening;Right;10 reps;Supine Straight Leg Raises: Strengthening;Right;10 reps;Supine Goniometric ROM: -8 to 69 degrees AAROM, pain limited      Assessment/Plan    PT Assessment Patient needs continued PT services  PT Diagnosis Difficulty walking;Abnormality of  gait;Generalized weakness;Acute pain   PT Problem List Decreased strength;Decreased range of motion;Decreased activity tolerance;Decreased balance;Decreased mobility;Pain;Impaired sensation  PT Treatment Interventions DME instruction;Gait training;Stair training;Therapeutic exercise;Therapeutic activities;Balance training;Neuromuscular re-education;Cognitive remediation;Patient/family education;Manual techniques   PT Goals (Current goals can be found in the Care Plan section) Acute Rehab PT Goals Patient Stated Goal: Improve function at home with less pain. Delay back surgery PT Goal Formulation: With patient Time For Goal Achievement: 12/19/15 Potential to Achieve Goals: Good    Frequency BID   Barriers to discharge        Co-evaluation               End of Session Equipment Utilized During Treatment: Gait belt Activity Tolerance: Patient tolerated treatment well Patient left: in chair;with call bell/phone within reach;with chair alarm set;with SCD's reapplied;Other (comment) (Polar care in place, towel roll under heel)           Time: [id number] PT Time Calculation (min) (ACUTE ONLY): 37 min   Charges:   PT Evaluation $PT Eval Moderate Complexity: 1 Procedure PT Treatments $Therapeutic Exercise: 8-22 mins   PT G Codes:       Lyndel Safe Bo Teicher PT, DPT   Darol Cush 12/05/2015, 4:11 PM

## 2015-12-05 NOTE — Op Note (Signed)
OPERATIVE NOTE  DATE OF SURGERY:  12/05/2015  PATIENT NAME:  Thomas Mcdaniel   DOB: Sep 10, 1945  MRN: [id number]  PRE-OPERATIVE DIAGNOSIS: Degenerative arthrosis of the right knee, primary  POST-OPERATIVE DIAGNOSIS:  Same  PROCEDURE:  Right total knee arthroplasty using computer-assisted navigation  SURGEON:  Marciano Sequin. M.D.  ASSISTANT:  Vance Peper, PA (present and scrubbed throughout the case, critical for assistance with exposure, retraction, instrumentation, and closure)  ANESTHESIA: spinal  ESTIMATED BLOOD LOSS: 50 mL  FLUIDS REPLACED: 1300 mL of crystalloid  TOURNIQUET TIME: 111 minutes  DRAINS: 2 medium drains to a reinfusion system  SOFT TISSUE RELEASES: Anterior cruciate ligament, posterior cruciate ligament, deep medial collateral ligament, patellofemoral ligament   IMPLANTS UTILIZED: DePuy Attune size 8 posterior stabilized femoral component (cemented), size 9 rotating platform tibial component (cemented), 41 mm medialized dome patella (cemented), and a 5 mm stabilized rotating platform polyethylene insert.  INDICATIONS FOR SURGERY: Thomas Mcdaniel is a 70 y.o. year old male with a long history of progressive knee pain. X-rays demonstrated severe degenerative changes in tricompartmental fashion. The patient had not seen any significant improvement despite conservative nonsurgical intervention. After discussion of the risks and benefits of surgical intervention, the patient expressed understanding of the risks benefits and agree with plans for total knee arthroplasty.   The risks, benefits, and alternatives were discussed at length including but not limited to the risks of infection, bleeding, nerve injury, stiffness, blood clots, the need for revision surgery, cardiopulmonary complications, among others, and they were willing to proceed.  PROCEDURE IN DETAIL: The patient was brought into the operating room and, after adequate spinal anesthesia was achieved, a  tourniquet was placed on the patient's upper thigh. The patient's knee and leg were cleaned and prepped with alcohol and DuraPrep and draped in the usual sterile fashion. A "timeout" was performed as per usual protocol. The lower extremity was exsanguinated using an Esmarch, and the tourniquet was inflated to 300 mmHg. An anterior longitudinal incision was made followed by a standard mid vastus approach. The deep fibers of the medial collateral ligament were elevated in a subperiosteal fashion off of the medial flare of the tibia so as to maintain a continuous soft tissue sleeve. The patella was subluxed laterally and the patellofemoral ligament was incised. Inspection of the knee demonstrated severe degenerative changes with full-thickness loss of articular cartilage. Osteophytes were debrided using a rongeur. Anterior and posterior cruciate ligaments were excised. Two 4.0 mm Schanz pins were inserted in the femur and into the tibia for attachment of the array of trackers used for computer-assisted navigation. Hip center was identified using a circumduction technique. Distal landmarks were mapped using the computer. The distal femur and proximal tibia were mapped using the computer. The distal femoral cutting guide was positioned using computer-assisted navigation so as to achieve a 5 distal valgus cut. The femur was sized and it was felt that a size 8 femoral component was appropriate. A size 8 femoral cutting guide was positioned and the anterior cut was performed and verified using the computer. This was followed by completion of the posterior and chamfer cuts. Femoral cutting guide for the central box was then positioned in the center box cut was performed.  Attention was then directed to the proximal tibia. Medial and lateral menisci were excised. The extramedullary tibial cutting guide was positioned using computer-assisted navigation so as to achieve a 0 varus-valgus alignment and 3 posterior slope. The  cut was performed and verified using the  computer. The proximal tibia was sized and it was felt that a size 9 tibial tray was appropriate. Tibial and femoral trials were inserted followed by insertion of a 5 mm polyethylene insert. This allowed for excellent mediolateral soft tissue balancing both in flexion and in full extension. Finally, the patella was cut and prepared so as to accommodate a 41 mm medialized dome patella. A patella trial was placed and the knee was placed through a range of motion with excellent patellar tracking appreciated. The femoral trial was removed after debridement of posterior osteophytes. The central post-hole for the tibial component was reamed followed by insertion of a keel punch. Tibial trials were then removed. Cut surfaces of bone were irrigated with copious amounts of normal saline with antibiotic solution using pulsatile lavage and then suctioned dry. Polymethylmethacrylate cement was prepared in the usual fashion using a vacuum mixer. Cement was applied to the cut surface of the proximal tibia as well as along the undersurface of a size 9 rotating platform tibial component. Tibial component was positioned and impacted into place. Excess cement was removed using [employer] fast streamer. Cement was then applied to the cut surfaces of the femur as well as along the posterior flanges of the size 8 femoral component. The femoral component was positioned and impacted into place. Excess cement was removed using [employer] fast streamer. A 5 mm polyethylene trial was inserted and the knee was brought into full extension with steady axial compression applied. Finally, cement was applied to the backside of a 41 mm medialized dome patella and the patellar component was positioned and patellar clamp applied. Excess cement was removed using [employer] fast streamer. After adequate curing of the cement, the tourniquet was deflated after a total tourniquet time of 111 minutes. Hemostasis was achieved using  electrocautery. The knee was irrigated with copious amounts of normal saline with antibiotic solution using pulsatile lavage and then suctioned dry. 20 mL of 1.3% Exparel in 40 mL of normal saline was injected along the posterior capsule, medial and lateral gutters, and along the arthrotomy site. A 5 mm stabilized rotating platform polyethylene insert was inserted and the knee was placed through a range of motion with excellent mediolateral soft tissue balancing appreciated and excellent patellar tracking noted. 2 medium drains were placed in the wound bed and brought out through separate stab incisions to be attached to a reinfusion system. The medial parapatellar portion of the incision was reapproximated using interrupted sutures of #1 Vicryl. Subcutaneous tissue was then injected with a total of 30 cc of 0.25% Marcaine with epinephrine. Subcutaneous tissue was approximated in layers using first #0 Vicryl followed #2-0 Vicryl. The skin was approximated with skin staples. A sterile dressing was applied.  The patient tolerated the procedure well and was transported to the recovery room in stable condition.    Adan Baehr P. Holley Bouche., M.D.

## 2015-12-05 NOTE — Progress Notes (Signed)
Ancef 2 gm & sacral drsg sent with pt to OR (will be spinal per anesthesia) To OR with thermal cap in place

## 2015-12-05 NOTE — Anesthesia Procedure Notes (Addendum)
Spinal Patient location during procedure: OR Start time: 12/05/2015 7:10 AM End time: 12/05/2015 7:23 AM Staffing Performed by: anesthesiologist  Preanesthetic Checklist Completed: patient identified, site marked, surgical consent, pre-op evaluation, timeout performed, IV checked, risks and benefits discussed and monitors and equipment checked Spinal Block Patient position: sitting Prep: Betadine Patient monitoring: heart rate, cardiac monitor, continuous pulse ox and blood pressure Approach: midline Location: L2-3 Injection technique: single-shot Needle Needle type: Whitacre  Needle gauge: 25 G Needle length: 9 cm Needle insertion depth: 8 cm  Date/Time: 12/05/2015 7:22 AM Performed by: Johnna Acosta Pre-anesthesia Checklist: Patient identified, Emergency Drugs available, Suction available, Patient being monitored and Timeout performed Patient Re-evaluated:Patient Re-evaluated prior to inductionOxygen Delivery Method: Simple face mask Preoxygenation: Pre-oxygenation with 100% oxygen

## 2015-12-05 NOTE — Care Management Note (Addendum)
Case Management Note  Patient Details  Name: Thomas Mcdaniel MRN: [id number] Date of Birth: August 30, 1945  Subjective/Objective:    70yo Mr Thomas Mcdaniel received a right knee repair by Dr Marry Guan on 12/05/15. He is groggy from having surgery today but was able to answer simple questions. No family at bedside. PCP=Dr Johny Drilling.  Pharmacy= [facility] [place]. [facility] #[facility] was [facility] to [facility] [place]. Resides at home with wife who will provide transportation to appointments. Has a RW and a BSC at home. No home oxygen. No home health services. Left a list of home health providers for [name] to discuss with his wife. Anticipate home with home health.Case management will follow for discharge planning.                Action/Plan:   Expected Discharge Date:                  Expected Discharge Plan:     In-House Referral:     Discharge planning Services     Post Acute Care Choice:    Choice offered to:     DME Arranged:    DME Agency:     HH Arranged:    [place] Agency:     Status of Service:     Medicare Important Message Given:    Date Medicare IM Given:    Medicare IM give by:    Date Additional Medicare IM Given:    Additional Medicare Important Message give by:     If discussed at [place] of Stay Meetings, dates discussed:    Additional Comments:  Clanton Emanuelson A, RN 12/05/2015, 2:05 PM

## 2015-12-05 NOTE — Care Management Important Message (Signed)
Important Message  Patient Details  Name: YAMA MAFI MRN: [id number] Date of Birth: 05-17-1946   Medicare Important Message Given:  Yes    Marvina Danner A, RN 12/05/2015, 2:18 PM

## 2015-12-05 NOTE — H&P (Signed)
The patient has been re-examined, and the chart reviewed, and there have been no interval changes to the documented history and physical.    The risks, benefits, and alternatives have been discussed at length. The patient expressed understanding of the risks benefits and agreed with plans for surgical intervention.  James P. Hooten, Jr. M.D.    

## 2015-12-05 NOTE — Anesthesia Preprocedure Evaluation (Signed)
Anesthesia Evaluation  Patient identified by MRN, date of birth, ID band Patient awake    Reviewed: Allergy & Precautions, NPO status , Patient's Chart, lab work & pertinent test results  History of Anesthesia Complications (+) PONV  Airway Mallampati: III       Dental   Pulmonary neg pulmonary ROS, former smoker,           Cardiovascular hypertension, Pt. on medications      Neuro/Psych negative neurological ROS     GI/Hepatic Neg liver ROS, GERD (hx, no current symptoms)  ,  Endo/Other  negative endocrine ROS  Renal/GU Renal disease (stones)     Musculoskeletal  (+) Arthritis , Osteoarthritis,    Abdominal   Peds  Hematology negative hematology ROS (+)   Anesthesia Other Findings   Reproductive/Obstetrics                             Anesthesia Physical Anesthesia Plan  ASA: II  Anesthesia Plan: Spinal   Post-op Pain Management:    Induction: Intravenous  Airway Management Planned: Nasal Cannula  Additional Equipment:   Intra-op Plan:   Post-operative Plan:   Informed Consent: I have reviewed the patients History and Physical, chart, labs and discussed the procedure including the risks, benefits and alternatives for the proposed anesthesia with the patient or authorized representative who has indicated his/her understanding and acceptance.     Plan Discussed with:   Anesthesia Plan Comments:         Anesthesia Quick Evaluation

## 2015-12-05 NOTE — Brief Op Note (Signed)
12/05/2015  11:17 AM  PATIENT:  Thomas Mcdaniel  70 y.o. male  PRE-OPERATIVE DIAGNOSIS:  osteoarthritis of the right knee  POST-OPERATIVE DIAGNOSIS:  Same  PROCEDURE:  Procedure(s): COMPUTER ASSISTED TOTAL KNEE ARTHROPLASTY (Right)  SURGEON:  Surgeon(s) and Role:    * Dereck Leep, MD - Primary  ASSISTANTS: Vance Peper, PA   ANESTHESIA:   spinal  EBL:  Total I/O In: 1400 [I.V.:1400] Out: 250 [Urine:200; Blood:50]  BLOOD ADMINISTERED:none  DRAINS: 2 medium drains to a reinfusion system   LOCAL MEDICATIONS USED:  MARCAINE    and OTHER Exparel  SPECIMEN:  No Specimen  DISPOSITION OF SPECIMEN:  N/A  COUNTS:  YES  TOURNIQUET:   111 minutes  DICTATION: .Dragon Dictation  PLAN OF CARE: Admit to inpatient   PATIENT DISPOSITION:  PACU - hemodynamically stable.   Delay start of Pharmacological VTE agent (>24hrs) due to surgical blood loss or risk of bleeding: yes

## 2015-12-05 NOTE — Progress Notes (Signed)
First Autovac 550 ml, 2nd 450 ml. MD Hooten notified. Per MD ok to hang 3rd Autovac if necessary.

## 2015-12-06 LAB — BASIC METABOLIC PANEL
ANION GAP: 7 (ref 5–15)
BUN: 15 mg/dL (ref 6–20)
CALCIUM: 8.8 mg/dL — AB (ref 8.9–10.3)
CHLORIDE: 107 mmol/L (ref 101–111)
CO2: 22 mmol/L (ref 22–32)
Creatinine, Ser: 0.88 mg/dL (ref 0.61–1.24)
GFR calc Af Amer: >60 mL/min (ref >60)
GFR calc non Af Amer: >60 mL/min (ref >60)
GLUCOSE: 176 mg/dL — AB (ref 65–99)
Potassium: 4.2 mmol/L (ref 3.5–5.1)
Sodium: 136 mmol/L (ref 135–145)

## 2015-12-06 LAB — CBC
HCT: 36.7 % — ABNORMAL LOW (ref 40.0–52.0)
Hemoglobin: 12.6 g/dL — ABNORMAL LOW (ref 13.0–18.0)
MCH: 31.1 pg (ref 26.0–34.0)
MCHC: 34.3 g/dL (ref 32.0–36.0)
MCV: 90.8 fL (ref 80.0–100.0)
Platelets: 148 K/uL — ABNORMAL LOW (ref 150–440)
RBC: 4.04 MIL/uL — ABNORMAL LOW (ref 4.40–5.90)
RDW: 14.1 % (ref 11.5–14.5)
WBC: 11.2 K/uL — ABNORMAL HIGH (ref 3.8–10.6)

## 2015-12-06 MED ORDER — TRAMADOL HCL 50 MG PO TABS: 50.0000 mg | ORAL_TABLET | ORAL | Status: DC | PRN

## 2015-12-06 MED ORDER — OXYCODONE HCL 5 MG PO TABS: 5.0000 mg | ORAL_TABLET | ORAL | Status: DC | PRN

## 2015-12-06 MED ORDER — ENOXAPARIN SODIUM 40 MG/0.4ML ~~LOC~~ SOLN: 40.0000 mg | SUBCUTANEOUS | Status: DC

## 2015-12-06 NOTE — Progress Notes (Signed)
   Subjective: 1 Day Post-Op Procedure(s) (LRB): COMPUTER ASSISTED TOTAL KNEE ARTHROPLASTY (Right) Patient reports pain as 4 on 0-10 scale.   Patient is well, and has had no acute complaints or problems We will start therapy today.  Plan is to go Home after hospital stay. no nausea and no vomiting Patient denies any chest pains or shortness of breath. Objective: Vital signs in last 24 hours: Temp:  [97.3 F (36.3 C)-99.7 F (37.6 C)] 97.9 F (36.6 C) (06/13 0359) Pulse Rate:  [70-95] 73 (06/13 0359) Resp:  [14-20] 19 (06/13 0359) BP: (123-158)/(65-93) 127/72 mmHg (06/13 0359) SpO2:  [93 %-97 %] 95 % (06/13 0359) Heels are non tender and elevated off the bed using rolled towels  Intake/Output from previous day: 06/12 0701 - 06/13 0700 In: 4830.6 [P.O.:960; I.V.:3046.7; Blood:524; IV [id number] Out: [id number]; Drains:1560; Blood:50] Intake/Output this shift:     Recent Labs  12/06/15 0350  HGB 12.6*    Recent Labs  12/06/15 0350  WBC 11.2*  RBC 4.04*  HCT 36.7*  PLT 148*    Recent Labs  12/06/15 0350  NA 136  K 4.2  CL 107  CO2 22  BUN 15  CREATININE 0.88  GLUCOSE 176*  CALCIUM 8.8*   No results for input(s): LABPT, INR in the last 72 hours.  EXAM General - Patient is Alert, Appropriate and Oriented Extremity - Neurologically intact Neurovascular intact Sensation intact distally Intact pulses distally Dorsiflexion/Plantar flexion intact Compartment soft Dressing - dressing C/D/I Motor Function - intact, moving foot and toes well on exam. Pt able to do SLR on own  Past Medical History  Diagnosis Date  . BPH (benign prostatic hyperplasia)   . DJD (degenerative joint disease)   . GERD (gastroesophageal reflux disease)   . Gout   . Hard of hearing   . Hemorrhoids   . Hyperlipidemia   . Hypertension   . Obesity   . Scoliosis   . Lumbar spinal stenosis   . Glucose intolerance (impaired glucose tolerance)   . Nephrolithiasis   .  Personal history of PE (pulmonary embolism)   . Complication of anesthesia     difficult to wake up after a lithotripsy  . PONV (postoperative nausea and vomiting)     after cataract surgery    Assessment/Plan: 1 Day Post-Op Procedure(s) (LRB): COMPUTER ASSISTED TOTAL KNEE ARTHROPLASTY (Right) Active Problems:   S/P total knee arthroplasty  Estimated body mass index is 36.16 kg/(m^2) as calculated from the following:   Height as of this encounter: 5\' 9"  (1.753 m).   Weight as of this encounter: 111.131 kg (245 lb). Advance diet Up with therapy D/C IV fluids Plan for discharge tomorrow Discharge home with home health  Labs: reviewed DVT Prophylaxis - Lovenox, Foot Pumps and TED hose Weight-Bearing as tolerated to right leg D/C O2 and Pulse OX and try on Room [employer] tomorrow am Begin working on a bowel movement  Temari Schooler R. [place] 12/06/2015, 7:18 AM

## 2015-12-06 NOTE — Anesthesia Postprocedure Evaluation (Signed)
Anesthesia Post Note  Patient: Thomas Mcdaniel  Procedure(s) Performed: Procedure(s) (LRB): COMPUTER ASSISTED TOTAL KNEE ARTHROPLASTY (Right)  Patient location during evaluation: Nursing Unit Anesthesia Type: Spinal Level of consciousness: awake, awake and alert and oriented Pain management: pain level controlled Vital Signs Assessment: post-procedure vital signs reviewed and stable Respiratory status: spontaneous breathing, nonlabored ventilation and respiratory function stable Cardiovascular status: blood pressure returned to baseline and stable Postop Assessment: no headache and no signs of nausea or vomiting Anesthetic complications: no    Last Vitals:  Filed Vitals:   12/05/15 1918 12/06/15 0359  BP: 148/84 127/72  Pulse: 87 73  Temp: 36.6 C 36.6 C  Resp: 18 19    Last Pain:  Filed Vitals:   12/06/15 0603  PainSc: Asleep                 Johnna Acosta

## 2015-12-06 NOTE — Plan of Care (Signed)
Problem: Pain Management: Goal: Pain level will decrease with appropriate interventions Outcome: Progressing POD 1 pain controlled with PRN medication

## 2015-12-06 NOTE — Discharge Summary (Signed)
Physician Discharge Summary  Patient ID: [name] MRN: [id number] DOB/AGE: 1945-11-24 70 y.o.  Admit date: 12/05/2015 Discharge date: 12/07/2015  Admission Diagnoses:  osteoarthritis Degenerative arthrosis of the right knee, primary Discharge Diagnoses: Patient Active Problem List   Diagnosis Date Noted  . 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 BPH (benign prostatic hyperplasia)   . DJD (degenerative joint disease)   . GERD (gastroesophageal reflux disease)   . Gout   . Hard of hearing   . Hemorrhoids   . Hyperlipidemia   . Hypertension   . Obesity   . Scoliosis   . Lumbar spinal stenosis   . Glucose intolerance (impaired glucose tolerance)   . Nephrolithiasis   . Personal history of PE (pulmonary embolism)   . Complication of anesthesia     difficult to wake up after a lithotripsy  . PONV (postoperative nausea and vomiting)     after cataract surgery     Transfusion: Autovac transfusions given the first 6 hours postoperatively   Consultants (if any):  case management for home health assistance  Discharged Condition: Improved  Hospital Course: Thomas Mcdaniel is an 70 y.o. male who was admitted 12/05/2015 with a diagnosis of degenerative arthrosis right knee and went to the operating room on 12/05/2015 and underwent the above named procedures.    Surgeries:Procedure(s): COMPUTER ASSISTED TOTAL KNEE ARTHROPLASTY on 12/05/2015  PRE-OPERATIVE DIAGNOSIS: Degenerative arthrosis of the right knee, primary  POST-OPERATIVE DIAGNOSIS: Same  PROCEDURE: Right total knee arthroplasty using computer-assisted navigation  SURGEON: Marciano Sequin. M.D.  ASSISTANT: Vance Peper, PA (present and scrubbed throughout the case, critical for assistance with exposure, retraction, instrumentation, and closure)  ANESTHESIA: spinal  ESTIMATED BLOOD LOSS: 50 mL  FLUIDS REPLACED:  1300 mL of crystalloid  TOURNIQUET TIME: 111 minutes  DRAINS: 2 medium drains to a reinfusion system  SOFT TISSUE RELEASES: Anterior cruciate ligament, posterior cruciate ligament, deep medial collateral ligament, patellofemoral ligament   IMPLANTS UTILIZED: DePuy Attune size 8 posterior stabilized femoral component (cemented), size 9 rotating platform tibial component (cemented), 41 mm medialized dome patella (cemented), and a 5 mm stabilized rotating platform polyethylene insert.  INDICATIONS FOR SURGERY: Thomas Mcdaniel is a 70 y.o. year old male with a long history of progressive knee pain. X-rays demonstrated severe degenerative changes in tricompartmental fashion. The patient had not seen any significant improvement despite conservative nonsurgical intervention. After discussion of the risks and benefits of surgical intervention, the patient expressed understanding of the risks benefits and agree with plans for total knee arthroplasty.   The risks, benefits, and alternatives were discussed at length including but not limited to the risks of infection, bleeding, nerve injury, stiffness, blood clots, the need for revision surgery, cardiopulmonary complications, among others, and they were willing to proceed. Patient tolerated the surgery well. No complications .Patient was taken to PACU where she was stabilized and then transferred to the orthopedic floor.  Patient started on Lovenox 30 q 12 hrs. Foot pumps applied bilaterally at 80 mm hg. Heels elevated off bed with rolled towels. No evidence of DVT. Calves non tender. Negative Homan. Physical therapy started on day #1 for gait training and transfer with OT starting on  day #1 for ADL and assisted devices. Patient has done well with therapy. Ambulated greater than 200 feet upon being discharged. Patient was able to ascend and descend 4 steps safely and independently  Patient's  IV and Foley were discontinued on day #1 with Hemovac being  discontinued on day #2. Dressing was also changed on day #2 prior to patient being discharged to home  [place] of bilateral lower extremities were negative for DVT.   He was given perioperative antibiotics:  Anti-infectives    Start     Dose/Rate Route Frequency Ordered Stop   12/05/15 1330  ceFAZolin (ANCEF) IVPB 2g/100 mL premix     2 g 200 mL/hr over 30 Minutes Intravenous Every 6 hours 12/05/15 1230 12/06/15 1329   12/05/15 0611  ceFAZolin (ANCEF) 2-4 GM/100ML-% IVPB    Comments:  Phillips Grout: cabinet override      12/05/15 0611 12/05/15 1814   12/05/15 0206  ceFAZolin (ANCEF) IVPB 2g/100 mL premix     2 g 200 mL/hr over 30 Minutes Intravenous On call to O.R. 12/05/15 0206 12/05/15 0751    .  He was fitted with AV 1 compression foot pump  devices, instructed on heel pumps, early ambulation, and fitted with TED stockings bilaterally for DVT prophylaxis. [place] of bilateral lower extremities were negative for DVT.  He benefited maximally from the hospital stay and there were no complications.    Recent vital signs:  Filed Vitals:   12/05/15 1918 12/06/15 0359  BP: 148/84 127/72  Pulse: 87 73  Temp: 97.8 F (36.6 C) 97.9 F (36.6 C)  Resp: 18 19    Recent laboratory studies:  Lab Results  Component Value Date   HGB 12.6* 12/06/2015   HGB 15.4 11/23/2015   Lab Results  Component Value Date   WBC 11.2* 12/06/2015   PLT 148* 12/06/2015   Lab Results  Component Value Date   INR 1.12 11/23/2015   Lab Results  Component Value Date   NA 136 12/06/2015   K 4.2 12/06/2015   CL 107 12/06/2015   CO2 22 12/06/2015   BUN 15 12/06/2015   CREATININE 0.88 12/06/2015   GLUCOSE 176* 12/06/2015    Discharge Medications:     Medication List    TAKE these medications        amLODipine 10 MG tablet  Commonly known as:  NORVASC  Take 10 mg by mouth daily.     colchicine 0.6 MG tablet  Take 0.6 mg by mouth 2 (two) times daily as needed.     enoxaparin 40 MG/0.4ML injection   Commonly known as:  LOVENOX  Inject 0.4 mLs (40 mg total) into the skin daily.     famotidine 40 MG tablet  Commonly known as:  PEPCID  Take 40 mg by mouth daily as needed.     lisinopril 40 MG tablet  Commonly known as:  PRINIVIL,ZESTRIL  Take 40 mg by mouth daily.     oxyCODONE 5 MG immediate release tablet  Commonly known as:  Oxy IR/ROXICODONE  Take 1-2 tablets (5-10 mg total) by mouth every 4 (four) hours as needed for severe pain or breakthrough pain.     traMADol 50 MG tablet  Commonly known as:  ULTRAM  Take 1-2 tablets (50-100 mg total) by mouth every 4 (four) hours as needed for moderate pain.     vitamin B-12 1000 MCG tablet  Commonly known as:  CYANOCOBALAMIN  Take 1,000 mcg by mouth daily.        Diagnostic Studies: Dg Knee Right Port  12/05/2015  CLINICAL DATA:  Status post right total knee replacement today. Postoperative exam. EXAM: PORTABLE RIGHT KNEE - 1-2 VIEW COMPARISON:  None. FINDINGS:  A right total knee arthroplasty is in place. The device is located and there is no fracture. Surgical drain and staples are noted. IMPRESSION: Right total knee replacement.  No acute abnormality. Electronically Signed   By: Inge Rise M.D.   On: 12/05/2015 11:46    Disposition: Pt will be discharged to SNF later today.  Pt to continue Lovenox for DVT prophylaxis, follow-up with [facility] in 2 weeks.      Discharge Instructions    Diet - low sodium heart healthy    Complete by:  As directed      Increase activity slowly    Complete by:  As directed            Follow-up Information    Follow up with [facility] PA On 12/20/2015.   Specialty:  Physician Assistant   Why:  at 9:15am   Contact information:   [address] [facility] [place] [address] [phone]       Follow up with Dereck Leep, MD On 01/17/2016.   Specialty:  Orthopedic Surgery   Why:  at 1:45pm   Contact information:   [place] [address] [phone]      Signed: Raquel James, PA-C 12/06/2015, 7:40 AM

## 2015-12-06 NOTE — Progress Notes (Signed)
Physical Therapy Treatment Patient Details Name: Thomas Mcdaniel MRN: [id number] DOB: 11/10/1945 Today's Date: 12/06/2015    History of Present Illness Pt  was admitted for a R TKR without reported post-op complications. He reports return of sensation to RLE at time of evaluation.     PT Comments    Pt is making good progress towards goals with increased there-ex difficulty and good endurance with ambulation. Pt continues to be motivated to perform therapy. Compensates R foot drop with increased R knee flexion. Pt educated about clearing toes in order to decrease fall risk.   Follow Up Recommendations  Home health PT     Equipment Recommendations  None recommended by PT    Recommendations for Other Services       Precautions / Restrictions Precautions Precautions: Fall;Knee Precaution Booklet Issued: Yes (comment) Restrictions Weight Bearing Restrictions: Yes RLE Weight Bearing: Weight bearing as tolerated    Mobility  Bed Mobility Overal bed mobility: Modified Independent Bed Mobility: Supine to Sit;Sit to Supine     Supine to sit: Modified independent (Device/Increase time)     General bed mobility comments: safe technique performed with cues for sequencing. Once seated at EOB, pt able to sit with supervision  Transfers Overall transfer level: Needs assistance Equipment used: Rolling walker (2 wheeled) Transfers: Sit to/from Stand Sit to Stand: Min guard         General transfer comment: Elevated bed prior to standing for ease of transfer. RW used for transfer with cues for sequencing.  Ambulation/Gait Ambulation/Gait assistance: Min guard Ambulation Distance (Feet): 200 Feet Assistive device: Rolling walker (2 wheeled) Gait Pattern/deviations: Step-to pattern     General Gait Details: step to gait pattern performed with increased R knee flexion for toe clearance. Pt able to gradually improve to reciprocal gait pattern, however not consistent. Slow speed  noted   Stairs            Wheelchair Mobility    Modified Rankin (Stroke Patients Only)       Balance                                    Cognition Arousal/Alertness: Awake/alert Behavior During Therapy: WFL for tasks assessed/performed Overall Cognitive Status: Within Functional Limits for tasks assessed                      Exercises Other Exercises Other Exercises: Supine ther-ex performed including R LE quad sets, hip abd/add, SAQ, SLR, and glut sets. Ankle pumps performed on L LE, however has difficulty on R LE. 10 reps of all ther-ex performed with cga.    General Comments        Pertinent Vitals/Pain Pain Assessment: 0-10 Pain Score: 6  Pain Location: R knee Pain Descriptors / Indicators: Operative site guarding Pain Intervention(s): Limited activity within patient's tolerance    Home Living                      Prior Function            PT Goals (current goals can now be found in the care plan section) Acute Rehab PT Goals Patient Stated Goal: Regain Independence PT Goal Formulation: With patient Time For Goal Achievement: 12/19/15 Potential to Achieve Goals: Good Progress towards PT goals: Progressing toward goals    Frequency  BID    PT Plan Current plan remains appropriate  Co-evaluation             End of Session Equipment Utilized During Treatment: Gait belt Activity Tolerance: Patient tolerated treatment well Patient left: in bed;with bed alarm set;with SCD's reapplied     Time: [id number] PT Time Calculation (min) (ACUTE ONLY): 30 min  Charges:  $Gait Training: 8-22 mins $Therapeutic Exercise: 8-22 mins                    G Codes:      Marissah Vandemark 01-04-2016, 4:48 PM Greggory Stallion, PT, DPT [phone]

## 2015-12-06 NOTE — Progress Notes (Signed)
There was not a hemovac in am assessment. Received it on nightshift

## 2015-12-06 NOTE — Progress Notes (Signed)
Physical Therapy Treatment Patient Details Name: Thomas Mcdaniel MRN: [id number] DOB: [birth year] Today's Date: 12/06/2015    History of Present Illness Pt underwent R TKR without reported post-op complications. He reports return of sensation to RLE at time of evaluation. Pt reports history of chronic bilateral LE neuropathy as well as chronic R foot drop    PT Comments    Pt in bed ready for session.  PT completed supine and seated exercises as described below.  He was able to get in/oob with use of rail.  He was able to complete x 10 SLR with <10 degree lag so knee immobilizer was not used this morning.  He ambulated 160' around nursing unit with good knee control.  Pt reports stumbling at home on occasion due to drop foot on R.  Discussed safety with gait.  Pt had orthotic to correct but stated it fitted poorly and rubbed on his ankle so he stopped wearing it.  He is in the process on getting a new one.  Stressed importance of wearing it to prevent falls and to have it adjusted as needed if it does not fit well.  Pt verbally agreed.     Follow Up Recommendations  Home health PT     Equipment Recommendations  None recommended by PT    Recommendations for Other Services       Precautions / Restrictions Precautions Precautions: Knee;Fall Restrictions Weight Bearing Restrictions: Yes RLE Weight Bearing: Weight bearing as tolerated    Mobility  Bed Mobility Overal bed mobility: Modified Independent Bed Mobility: Supine to Sit;Sit to Supine     Supine to sit: Modified independent (Device/Increase time) Sit to supine: Modified independent (Device/Increase time)      Transfers Overall transfer level: Needs assistance Equipment used: Rolling walker (2 wheeled) Transfers: Sit to/from Stand Sit to Stand: Min guard            Ambulation/Gait Ambulation/Gait assistance: Min guard Ambulation Distance (Feet): 160 Feet Assistive device: Rolling walker (2 wheeled) Gait  Pattern/deviations: Step-to pattern;Decreased stance time - right;Decreased step length - left Gait velocity: Decreased Gait velocity interpretation: <1.8 ft/sec, indicative of risk for recurrent falls General Gait Details: verbal cues for walker position and step length   Stairs            Wheelchair Mobility    Modified Rankin (Stroke Patients Only)       Balance Overall balance assessment: Needs assistance Sitting-balance support: Feet supported Sitting balance-Leahy Scale: Good     Standing balance support: Bilateral upper extremity supported Standing balance-Leahy Scale: Poor                      Cognition Arousal/Alertness: Awake/alert Behavior During Therapy: WFL for tasks assessed/performed Overall Cognitive Status: Within Functional Limits for tasks assessed                      Exercises Total Joint Exercises Ankle Circles/Pumps: Strengthening;Both;10 reps;Supine;Other (comment) Quad Sets: Strengthening;Both;10 reps;Supine Gluteal Sets: Strengthening;Both;10 reps;Supine Short Arc Quad: Strengthening;Right;10 reps;Supine Heel Slides: Strengthening;Right;10 reps;Supine Hip ABduction/ADduction: Strengthening;Right;10 reps;Supine Straight Leg Raises: Strengthening;Right;10 reps;Supine Long Arc Quad: Right;10 reps;Seated;Strengthening Knee Flexion: AROM;Strengthening;Right;10 reps;Seated Goniometric ROM: 5-87    General Comments        Pertinent Vitals/Pain Pain Assessment: 0-10 Pain Score: 4  Pain Location: R  knee Pain Descriptors / Indicators: Aching Pain Intervention(s): Monitored during session;Limited activity within patient's tolerance;Ice applied    Home Living  Prior Function            PT Goals (current goals can now be found in the care plan section) Progress towards PT goals: Progressing toward goals    Frequency  BID    PT Plan Current plan remains appropriate    Co-evaluation              End of Session Equipment Utilized During Treatment: Gait belt Activity Tolerance: Patient tolerated treatment well Patient left: in bed;with bed alarm set;with SCD's reapplied     Time: 1010-1034 PT Time Calculation (min) (ACUTE ONLY): 24 min  Charges:  $Gait Training: 8-22 mins $Therapeutic Exercise: 8-22 mins                    G Codes:      [name], PTA 12/06/2015, 11:32 AM

## 2015-12-06 NOTE — Discharge Instructions (Signed)

## 2015-12-06 NOTE — Evaluation (Signed)
Occupational Therapy Evaluation Patient Details Name: Thomas Mcdaniel MRN: [id number] DOB: 1945/07/22 Today's Date: 12/06/2015    History of Present Illness Pt  was admitted for a R TKR without reported post-op complications. He reports return of sensation to RLE at time of evaluation.    Clinical Impression   Pt. Is a 70 y.o. Male who was admitted for a RightTKR. Pt presents with limited ROM, Pain, weakness, and impaired functional mobility which hinder his ability to complete ADL and IADL tasks. Pt. could benefit from skilled OT services to review A/E use for LE ADLs, to review necessary home modifications, and to improve functional mobility for ADL/IADLs in order to work towards regaining Independence with ADL/IADLs.     Follow Up Recommendations       Equipment Recommendations       Recommendations for Other Services       Precautions / Restrictions Precautions Precautions: Fall;Knee Precaution Booklet Issued: Yes (comment) Required Braces or Orthoses: Knee Immobilizer - Right Restrictions Weight Bearing Restrictions: Yes RLE Weight Bearing: Weight bearing as tolerated      Mobility Bed Mobility Bed Mobility: Supine to Sit;Sit to Supine Min guard          Transfers Overall transfer level: Needs assistance Equipment used: Rolling walker (2 wheeled) Transfers: Sit to/from Stand Sit to Stand: Min guard              Balance Overall balance assessment: Needs assistance Sitting-balance support: Feet supported Sitting balance-Leahy Scale: Good     Standing balance support: Bilateral upper extremity supported Standing balance-Leahy Scale: Fair                              ADL Overall ADL's : Needs assistance/impaired Eating/Feeding: Set up   Grooming: Set up               Lower Body Dressing: Minimal assistance               Functional mobility during ADLs: Minimal assistance       Vision     Perception     Praxis       Pertinent Vitals/Pain Pain Assessment: 0-10 Pain Score: 5  Pain Location: R  knee Pain Descriptors / Indicators: Aching Pain Intervention(s): Monitored during session;Limited activity within patient's tolerance;Ice applied     Hand Dominance Right   Extremity/Trunk Assessment Upper Extremity Assessment Upper Extremity Assessment: Overall WFL for tasks assessed           Communication Communication Communication: No difficulties   Cognition Arousal/Alertness: Awake/alert Behavior During Therapy: WFL for tasks assessed/performed Overall Cognitive Status: Within Functional Limits for tasks assessed                     General Comments       Exercises     Shoulder Instructions      Home Living Family/patient expects to be discharged to:: Private residence Living Arrangements: Spouse/significant other Available Help at Discharge: Family Type of Home: House Home Access: Level entry     Home Layout: One level     Bathroom Shower/Tub: Occupational psychologist: Handicapped height     Home Equipment: Environmental consultant - 2 wheels;Cane - single point;Bedside commode;Grab bars - tub/shower          Prior Functioning/Environment Level of Independence: Independent with assistive device(s)  OT Diagnosis: Generalized weakness   OT Problem List: Decreased strength;Decreased activity tolerance;Pain;Decreased knowledge of use of DME or AE   OT Treatment/Interventions:      OT Goals(Current goals can be found in the care plan section) Acute Rehab OT Goals Patient Stated Goal: Regain Independence OT Goal Formulation: With patient Time For Goal Achievement: 12/20/15 Potential to Achieve Goals: Good  OT Frequency: Min 1X/week   Barriers to D/C:            Co-evaluation              End of Session Equipment Utilized During Treatment: Gait belt  Activity Tolerance: Patient tolerated treatment well Patient left: in bed;with call  bell/phone within reach;with bed alarm set   Time: [id number] OT Time Calculation (min): 27 min Charges:  OT General Charges $OT Visit: 1 Procedure OT Evaluation $OT Eval Moderate Complexity: 1 Procedure OT Treatments $Self Care/Home Management : 8-22 mins G-Codes:    Harrel Carina, MS, OTR/L Harrel Carina 12/06/2015, 11:47 AM

## 2015-12-06 NOTE — Clinical Social Work Note (Signed)
CSW consulted for New SNF. CSW reviewed chart. PT is recommending HHPT. CSW discussed case with RNCM, who will follow for discharge planning needs. CSW is signing off as no further needs identified.   Darden Dates, MSW, LCSW  Clinical Social Worker  [phone]

## 2015-12-06 NOTE — Care Management Note (Signed)
Case Management Note  Patient Details  Name: Thomas Mcdaniel MRN: [id number] Date of Birth: 29-Dec-1945  Subjective/Objective:      Lovenox co-pay is $17.36.               Action/Plan:   Expected Discharge Date:                  Expected Discharge Plan:     In-House Referral:     Discharge planning Services     Post Acute Care Choice:    Choice offered to:     DME Arranged:    DME Agency:     HH Arranged:    [place] Agency:     Status of Service:     Medicare Important Message Given:  Yes Date Medicare IM Given:    Medicare IM give by:    Date Additional Medicare IM Given:    Additional Medicare Important Message give by:     If discussed at [place] of Stay Meetings, dates discussed:    Additional Comments:  Jair Lindblad A, RN 12/06/2015, 2:45 PM

## 2015-12-07 LAB — CBC
HCT: 33.2 % — ABNORMAL LOW (ref 40.0–52.0)
HEMOGLOBIN: 11.4 g/dL — AB (ref 13.0–18.0)
MCH: 31.4 pg (ref 26.0–34.0)
MCHC: 34.5 g/dL (ref 32.0–36.0)
MCV: 91.2 fL (ref 80.0–100.0)
Platelets: 128 10*3/uL — ABNORMAL LOW (ref 150–440)
RBC: 3.64 MIL/uL — AB (ref 4.40–5.90)
RDW: 13.9 % (ref 11.5–14.5)
WBC: 8.3 10*3/uL (ref 3.8–10.6)

## 2015-12-07 LAB — BASIC METABOLIC PANEL
Anion gap: 7 (ref 5–15)
BUN: 18 mg/dL (ref 6–20)
CHLORIDE: 106 mmol/L (ref 101–111)
CO2: 26 mmol/L (ref 22–32)
CREATININE: 1 mg/dL (ref 0.61–1.24)
Calcium: 8.5 mg/dL — ABNORMAL LOW (ref 8.9–10.3)
Glucose, Bld: 113 mg/dL — ABNORMAL HIGH (ref 65–99)
Potassium: 3.6 mmol/L (ref 3.5–5.1)
SODIUM: 139 mmol/L (ref 135–145)

## 2015-12-07 NOTE — Progress Notes (Signed)
Occupational Therapy Treatment Patient Details Name: Thomas Mcdaniel MRN: [id number] DOB: [birth year] Today's Date: 12/07/2015    History of present illness Pt  was admitted for a R TKR without reported post-op complications. He reports return of sensation to RLE at time of evaluation.    OT comments  Pt. Requires increased assist to perform ADL tasks and A/E training secondary to pain and limited functional mobility. Pt. Continues to benefit from skilled OT intervention for ADL training, A/E training, functional mobility, and work simplification techniques in order to improve ADL and IADL functioning.   Follow Up Recommendations  Home health OT    Equipment Recommendations       Recommendations for Other Services      Precautions / Restrictions Precautions Precautions: Fall;Knee Precaution Booklet Issued: Yes (comment) Required Braces or Orthoses: Knee Immobilizer - Right Restrictions Weight Bearing Restrictions: Yes RLE Weight Bearing: Weight bearing as tolerated       Mobility Bed Mobility Overal bed mobility: Needs Assistance Bed Mobility: Supine to Sit     Supine to sit: Min assist Sit to supine: Mod assist             Balance                                   ADL Overall ADL's : Needs assistance/impaired                     Lower Body Dressing: Moderate assistance                        Vision                     Perception     Praxis      Cognition   Behavior During Therapy: WFL for tasks assessed/performed Overall Cognitive Status: Within Functional Limits for tasks assessed                       Extremity/Trunk Assessment               Exercises   Shoulder Instructions       General Comments      Pertinent Vitals/ Pain       Pain Assessment: 0-10 Pain Score: 8  Pain Location: R knee Pain Descriptors / Indicators: Operative site guarding Pain Intervention(s): Limited activity  within patient's tolerance  Home Living                                          Prior Functioning/Environment              Frequency Min 1X/week     Progress Toward Goals  OT Goals(current goals can now be found in the care plan section)     Acute Rehab OT Goals Patient Stated Goal: [place] Discharge plan remains appropriate    Co-evaluation                 End of Session     Activity Tolerance Patient tolerated treatment well   Patient Left in bed;with call bell/phone within reach;with bed alarm set;with family/visitor present   Nurse Communication          Time: [id number] OT Time Calculation (  min): 23 min  Charges: OT General Charges $OT Visit: 1 Procedure OT Treatments $Self Care/Home Management : 23-37 mins   Harrel Carina, MS, OTR/L  Harrel Carina 12/07/2015, 2:13 PM

## 2015-12-07 NOTE — Progress Notes (Signed)
Physical therapy notified nurse that knee appears swollen and warm. Nurse went in to assess pt. Pts knee appears swollen and warm. MD Hooten notified. MD Hooten will be by later this evening.

## 2015-12-07 NOTE — Care Management (Signed)
Spoke with patient regarding Home Health agency selection. He chose [facility]. I have notified Tim/Jerry with [facility] of patient choice. I notified patient of his cost for Lovenox- he states he has administered it before and no concerns. He has a front-wheeled walker at home.

## 2015-12-07 NOTE — Progress Notes (Signed)
   Subjective: 2 Days Post-Op Procedure(s) (LRB): COMPUTER ASSISTED TOTAL KNEE ARTHROPLASTY (Right) Patient reports pain as moderate. Mostly distal incision site, describes as burning pain that comes and goes. Patient is well, but has had some minor complaints of burning pain distal incision site. Denies any CP, SOB, ABD pain. We will continue therapy today.  Plan is to go Home after hospital stay.  Objective: Vital signs in last 24 hours: Temp:  [97.6 F (36.4 C)-98.2 F (36.8 C)] 98 F (36.7 C) (06/14 0429) Pulse Rate:  [57-76] 76 (06/14 0429) Resp:  [18] 18 (06/14 0429) BP: (106-133)/(62-76) 133/76 mmHg (06/14 0429) SpO2:  [93 %-95 %] 94 % (06/14 0429)  Intake/Output from previous day: 06/13 0701 - 06/14 0700 In: 1080 [P.O.:1080] Out: 1260 [Urine:735; Drains:525] Intake/Output this shift: Total I/O In: 600 [P.O.:600] Out: 550 [Urine:360; Drains:190]   Recent Labs  12/06/15 0350 12/07/15 0405  HGB 12.6* 11.4*    Recent Labs  12/06/15 0350 12/07/15 0405  WBC 11.2* 8.3  RBC 4.04* 3.64*  HCT 36.7* 33.2*  PLT 148* 128*    Recent Labs  12/06/15 0350 12/07/15 0405  NA 136 139  K 4.2 3.6  CL 107 106  CO2 22 26  BUN 15 18  CREATININE 0.88 1.00  GLUCOSE 176* 113*  CALCIUM 8.8* 8.5*   No results for input(s): LABPT, INR in the last 72 hours.  EXAM General - Patient is Alert, Appropriate and Oriented Extremity - Neurologically intact ABD soft Sensation intact distally Intact pulses distally No cellulitis present right foot drop, very limited ankle dorsiflexion Dressing - dressing C/D/I, scant drainage and hemovac removed Motor Function - intact, moving foot and toes well on exam.   Past Medical History  Diagnosis Date  . BPH (benign prostatic hyperplasia)   . DJD (degenerative joint disease)   . GERD (gastroesophageal reflux disease)   . Gout   . Hard of hearing   . Hemorrhoids   . Hyperlipidemia   . Hypertension   . Obesity   . Scoliosis    . Lumbar spinal stenosis   . Glucose intolerance (impaired glucose tolerance)   . Nephrolithiasis   . Personal history of PE (pulmonary embolism)   . Complication of anesthesia     difficult to wake up after a lithotripsy  . PONV (postoperative nausea and vomiting)     after cataract surgery    Assessment/Plan:   2 Days Post-Op Procedure(s) (LRB): COMPUTER ASSISTED TOTAL KNEE ARTHROPLASTY (Right) Active Problems:   S/P total knee arthroplasty  Estimated body mass index is 36.16 kg/(m^2) as calculated from the following:   Height as of this encounter: 5\' 9"  (1.753 m).   Weight as of this encounter: 111.131 kg (245 lb). Advance diet Up with therapy  Needs BM Plan on discharge to home with HHPT today pending progress with PT and improved knee pain. Follow up with [facility] in 2 weeks  DVT Prophylaxis - Lovenox, Foot Pumps and TED hose Weight-Bearing as tolerated to right leg D/C O2 and Pulse OX and try on Room Air  T. Rachelle Hora, PA-C [place] 12/07/2015, 6:54 AM

## 2015-12-07 NOTE — Progress Notes (Signed)
   12/07/15 1658  PT Visit Information  Last PT Received On 12/07/15  Total Joint Exercises  Goniometric ROM R LE AAROM 5-61 degrees  Late entry from PM session. Greggory Stallion, PT, DPT [phone]

## 2015-12-07 NOTE — Progress Notes (Signed)
Physical Therapy Treatment Patient Details Name: Thomas Mcdaniel MRN: [id number] DOB: 06/05/1946 Today's Date: 12/07/2015    History of Present Illness Pt  was admitted for a R TKR without reported post-op complications. He reports return of sensation to RLE at time of evaluation.     PT Comments    Pt struggling with repositioning in bed upon arrival; notes increased pain from jarring right knee last night. Pt requiring increased assist for bed mobility, transfers and ambulation at this time due to increased pain and ambulation distance significantly reduced. Encouraged ice and elevation for swelling management and to stay on top of pain management via pain medication; pt does not currently wish to request medication. Pt will need to improved to prior transfer/ambulation levels from this stay to safely return home. Continue PT to progress strength, range and all functional mobility.   Follow Up Recommendations  Home health PT;Other (comment) (versus SNF if ambulation does not improve to previous level)     Equipment Recommendations  None recommended by PT    Recommendations for Other Services       Precautions / Restrictions Precautions Precautions: Fall;Knee Precaution Booklet Issued: Yes (comment) Required Braces or Orthoses: Knee Immobilizer - Right Restrictions Weight Bearing Restrictions: Yes RLE Weight Bearing: Weight bearing as tolerated    Mobility  Bed Mobility Overal bed mobility: Needs Assistance Bed Mobility: Supine to Sit     Supine to sit: Min assist Sit to supine: Mod assist   General bed mobility comments: Assist for RLE to edge of bed and held to lower very slowly  Transfers Overall transfer level: Needs assistance Equipment used: Rolling walker (2 wheeled) Transfers: Sit to/from Stand Sit to Stand: Min assist         General transfer comment: from lowered bed surface. Several attempts to get hands situated correctly. Minimal use of RLE and  increased time to rise  Ambulation/Gait Ambulation/Gait assistance: Min guard Ambulation Distance (Feet): 5 Feet Assistive device: Rolling walker (2 wheeled) Gait Pattern/deviations: Step-to pattern;Decreased step length - right;Decreased step length - left;Decreased stance time - right;Decreased dorsiflexion - right;Decreased weight shift to right;Antalgic;Wide base of support (difficulty bending knee enough to clear R footdrop) Gait velocity: Decreased Gait velocity interpretation: <1.8 ft/sec, indicative of risk for recurrent falls General Gait Details: Difficulty due to increased pain today. Difficulty bearing weight through RLE and range at knee to pick RLE up for toe clearance. Effortful with O2 saturation decreased to 91%.    Stairs            Wheelchair Mobility    Modified Rankin (Stroke Patients Only)       Balance   Sitting-balance support: Single extremity supported;Feet supported Sitting balance-Leahy Scale: Good     Standing balance support: Bilateral upper extremity supported Standing balance-Leahy Scale: Fair                      Cognition Arousal/Alertness: Awake/alert Behavior During Therapy: WFL for tasks assessed/performed Overall Cognitive Status: Within Functional Limits for tasks assessed                      Exercises      General Comments        Pertinent Vitals/Pain Pain Assessment: 0-10 Pain Score: 7  Pain Location: R knee/thigh Pain Descriptors / Indicators: Aching;Heaviness;Operative site guarding;Pressure;Sharp;Shooting (pain into thigh) Pain Intervention(s): Limited activity within patient's tolerance;Monitored during session;Repositioned;Ice applied;Other (comment) (pt notes he will wait to ask for medication)  Home Living                      Prior Function            PT Goals (current goals can now be found in the care plan section) Progress towards PT goals: Progressing toward goals (slowed  due to jarring knee last night)    Frequency  BID    PT Plan Current plan remains appropriate (pending pt returns to improved ambulation)    Co-evaluation             End of Session Equipment Utilized During Treatment: Gait belt Activity Tolerance: Patient limited by pain Patient left: in chair;with call bell/phone within reach;with chair alarm set;with family/visitor present;with SCD's reapplied;Other (comment) (polar care in place)     Time: 1510-1530 PT Time Calculation (min) (ACUTE ONLY): 20 min  Charges:  $Gait Training: 8-22 mins                    G Codes:      Charlaine Dalton, PTA 12/07/2015, 3:58 PM

## 2015-12-07 NOTE — Progress Notes (Signed)
Pain controlled with PRN medication. Pt complaints of pain while laying down.  Assisted pt to side of bed to dangle. Pt up to [facility] with one assist.

## 2015-12-07 NOTE — Care Management Important Message (Signed)
Important Message  Patient Details  Name: Thomas Mcdaniel MRN: [id number] Date of Birth: 21-Aug-1945   Medicare Important Message Given:  Yes    [name] 12/07/2015, 3:33 PM

## 2015-12-07 NOTE — Progress Notes (Signed)
Physical Therapy Treatment Patient Details Name: Thomas Mcdaniel MRN: [id number] DOB: [birth year] Today's Date: 12/07/2015    History of Present Illness Pt  was admitted for a R TKR without reported post-op complications. He reports return of sensation to RLE at time of evaluation.     PT Comments    Pt is making limited progress towards goals and is very limited by pain this date. Pt reports sharp shooting pain at patella from mis-step during transfer to [facility] with RN staff last night. Pt only able to ambulate to recliner this date. Will plan to see in PM for further ambulation and knee flexion ROM. Pt still motivated to perform therapy.  Follow Up Recommendations  Home health PT     Equipment Recommendations  None recommended by PT    Recommendations for Other Services       Precautions / Restrictions Precautions Precautions: Fall;Knee Precaution Booklet Issued: Yes (comment) Restrictions Weight Bearing Restrictions: Yes RLE Weight Bearing: Weight bearing as tolerated    Mobility  Bed Mobility Overal bed mobility: Needs Assistance Bed Mobility: Supine to Sit     Supine to sit: Min assist     General bed mobility comments: assist for moving B LEs off bed. Hand rail required for trunk assistance. Once seated at EOB, pt able to sit with supervision  Transfers Overall transfer level: Needs assistance Equipment used: Rolling walker (2 wheeled) Transfers: Sit to/from Stand Sit to Stand: Min assist         General transfer comment: elevated bed prior to standing. RW used for transfers with difficulty WB on surgical leg. Once upright, pt able to stand with CGA  Ambulation/Gait Ambulation/Gait assistance: Min assist Ambulation Distance (Feet): 5 Feet Assistive device: Rolling walker (2 wheeled) Gait Pattern/deviations: Step-to pattern     General Gait Details: Pt with increased antaglic gait pattern performed. Holds R knee in flexion. Able to perform toe clearance,  however fatigues quickly and request to sit in chair   Stairs            Wheelchair Mobility    Modified Rankin (Stroke Patients Only)       Balance                                    Cognition Arousal/Alertness: Awake/alert Behavior During Therapy: WFL for tasks assessed/performed Overall Cognitive Status: Within Functional Limits for tasks assessed                      Exercises Total Joint Exercises Goniometric ROM: 5 degrees of AAROM on R LE extension; request knee flexion to be measured in PM session Other Exercises Other Exercises: supine ther-ex performed R LE quad sets, SLR, hip abd/add, and SAQ. All ther-ex performed x 15 reps with min assist. Exercises limited secondary to pain.    General Comments        Pertinent Vitals/Pain Pain Assessment: 0-10 Pain Score: 8  Pain Location: R knee Pain Descriptors / Indicators: Operative site guarding Pain Intervention(s): Limited activity within patient's tolerance    Home Living                      Prior Function            PT Goals (current goals can now be found in the care plan section) Acute Rehab PT Goals Patient Stated Goal: Regain Independence PT Goal  Formulation: With patient Time For Goal Achievement: 12/19/15 Potential to Achieve Goals: Good Progress towards PT goals: Progressing toward goals    Frequency  BID    PT Plan Current plan remains appropriate    Co-evaluation             End of Session Equipment Utilized During Treatment: Gait belt Activity Tolerance: Patient limited by pain Patient left: in chair;with chair alarm set     Time: [phone] PT Time Calculation (min) (ACUTE ONLY): 24 min  Charges:  $Gait Training: 8-22 mins $Therapeutic Exercise: 8-22 mins                    G Codes:      Thomas Mcdaniel 01/06/16, 10:55 AM Thomas Mcdaniel, PT, DPT [phone]

## 2015-12-08 ENCOUNTER — Inpatient Hospital Stay: Payer: Medicare Other

## 2015-12-08 NOTE — Progress Notes (Signed)
Patient is medically stable for D/C to [facility] today. Per [facility] liaison patient will go to room 504. RN will call report to Theressa Stamps at [phone] and will arrange EMS for transport. Clinical Education officer, museum (CSW) sent D/C Summary, FL2 and D/C Packet to [employer] via [employer]. Patient is aware of above. CSW contacted patient's wife [name] and made her aware of above. Please reconsult if future social work needs arise. CSW signing off.   Blima Rich, LCSW [phone]

## 2015-12-08 NOTE — Progress Notes (Signed)
Clinical Education officer, museum (CSW) met with patient and presented bed offers. Patient stated that his wife can chose the bed. CSW contacted patient's wife [name] and presented bed offers. Wife chose [facility]. [facility] liaison is aware of accepted bed offer. CSW will continue to follow and assist as needed.   Blima Rich, LCSW [phone]

## 2015-12-08 NOTE — Progress Notes (Signed)
Physical Therapy Treatment Patient Details Name: Thomas Mcdaniel MRN: [id number] DOB: 1945/09/28 Today's Date: 12/08/2015    History of Present Illness Pt  was admitted for a R TKR without reported post-op complications. He reports return of sensation to RLE at time of evaluation.     PT Comments    Pt agreeable to PT; notes pain in R knee to foot and left foot as well. Pt states foot pumps have increased pain in bilateral feet. R distal lower extremity noted to have increased swelling and warmth today compared to yesterday. Pt requests to use bedside commode fairly urgently. Required Mod A x 2 with rolling walker to get up with multiple attempts; ultimately stand pivot from raised surface with great difficulty and significant increased time; pt lost bladder control before reaching bedside commode. Further increased difficulty from bedside to recliner requiring Max x 2 with multiple attempts and time as well. Pt unable to attain full upright position or bear much weight through Right lower extremity. Pt up in chair and unable to demonstrate palpable quad set on right. Homans sign checked due to increased warmth and swelling; pt has strong pain response, but states it is in ankle, not calf or posterior knee. Therapist not completely confident this is a negative test. Discussed with primary therapist and MD for possible imaging. Also discussed regression with MD, SW and CM with recommendation changed to skilled nursing facility post hospital stay. Spoke with nursing regarding taking occasional breaks with foot pumps to avoid feet pain. Plan to see pt post lunch for continued progress of range, strength and functional mobility as tolerated.   Follow Up Recommendations  SNF     Equipment Recommendations  None recommended by PT    Recommendations for Other Services       Precautions / Restrictions Restrictions Weight Bearing Restrictions: Yes RLE Weight Bearing: Weight bearing as tolerated     Mobility  Bed Mobility Overal bed mobility: Needs Assistance Bed Mobility: Supine to Sit     Supine to sit: Mod assist;HOB elevated (use of trapeze)     General bed mobility comments: Increased assist required today. Unable to move RLE off edge of bed at all without assist. No control lowering to floor/total assist  Transfers Overall transfer level: Needs assistance Equipment used: Rolling walker (2 wheeled) Transfers: Sit to/from [facility] Sit to Stand: Mod assist;+2 physical assistance;From elevated surface Stand pivot transfers: Max assist;+2 physical assistance       General transfer comment: Pt with extreme difficulty with STS today noting pain in B feet and R knee to the foot. Unable to extend R knee in stand or attain full upright body posture due to pains throughout. Unable to take any steps. Requiring stand pivot transfer bed to [facility] and BSC to recliner in side by side position    Ambulation/Gait             General Gait Details: Unable today   Stairs            Wheelchair Mobility    Modified Rankin (Stroke Patients Only)       Balance           Standing balance support: Bilateral upper extremity supported Standing balance-Leahy Scale: Poor                      Cognition Arousal/Alertness: Awake/alert Behavior During Therapy: WFL for tasks assessed/performed Overall Cognitive Status: Within Functional Limits for tasks assessed  Exercises Total Joint Exercises Ankle Circles/Pumps: AROM;Left;20 reps (long sit) Quad Sets: Strengthening;Both;20 reps (long sit with ankle roll on R; no active QS palpable on R)    General Comments General comments (skin integrity, edema, etc.): R knee to ankle with increased warmth ans swelling. Homans elicits pain subjectively in ankle. Not confident this is a negative result.      Pertinent Vitals/Pain Pain Assessment: 0-10 Pain Score: 6  Pain  Location: R knee to foot Pain Descriptors / Indicators: Aching;Heaviness;Operative site guarding;Pressure;Sharp;Tightness Pain Intervention(s): Limited activity within patient's tolerance;Monitored during session;Premedicated before session;Repositioned;Ice applied;Other (comment) (Called MD)    Home Living                      Prior Function            PT Goals (current goals can now be found in the care plan section) Progress towards PT goals: Not progressing toward goals - comment    Frequency  BID    PT Plan Discharge plan needs to be updated    Co-evaluation             End of Session Equipment Utilized During Treatment: Gait belt Activity Tolerance: Patient limited by pain Patient left: in chair;with call bell/phone within reach;with chair alarm set;Other (comment) (polar care in place)     Time: [id number] PT Time Calculation (min) (ACUTE ONLY): 50 min  Charges:  $Therapeutic Exercise: 8-22 mins $Therapeutic Activity: 23-37 mins                    G Codes:      Charlaine Dalton, PTA 12/08/2015, 11:23 AM

## 2015-12-08 NOTE — Clinical Social Work Placement (Signed)
   CLINICAL SOCIAL WORK PLACEMENT  NOTE  Date:  12/08/2015  Patient Details  Name: Thomas Mcdaniel MRN: [id number] Date of Birth: [birth year]  Clinical Social Work is seeking post-discharge placement for this patient at the [place] level of care (*CSW will initial, date and re-position this form in  chart as items are completed):  Yes   Patient/family provided with [place] Work Department's list of facilities offering this level of care within the geographic area requested by the patient (or if unable, by the patient's family).  Yes   Patient/family informed of their freedom to choose among providers that offer the needed level of care, that participate in Medicare, Medicaid or managed care program needed by the patient, have an available bed and are willing to accept the patient.  Yes   Patient/family informed of 's ownership interest in [facility] and [facility], as well as of the fact that they are under no obligation to receive care at these facilities.  PASRR submitted to EDS on 12/08/15     PASRR number received on 12/08/15     Existing PASRR number confirmed on       FL2 transmitted to all facilities in geographic area requested by pt/family on 12/08/15     FL2 transmitted to all facilities within larger geographic area on       Patient informed that his/her managed care company has contracts with or will negotiate with certain facilities, including the following:        Yes   Patient/family informed of bed offers received.  Patient chooses bed at  ([facility] )     Physician recommends and patient chooses bed at      Patient to be transferred to  ([facility] ) on 12/08/15.  Patient to be transferred to facility by  [facility] )     Patient family notified on 12/08/15 of transfer.  Name of family member notified:   (Patient's wife [name] is aware of D/C today. )     PHYSICIAN       Additional Comment:     _______________________________________________ Loralyn Freshwater, LCSW 12/08/2015, 4:28 PM

## 2015-12-08 NOTE — Progress Notes (Addendum)
   Subjective: 3 Days Post-Op Procedure(s) (LRB): COMPUTER ASSISTED TOTAL KNEE ARTHROPLASTY (Right) Patient reports pain as moderate.  Describes as burning pain that comes and goes.  Pt also complaining of soreness in bilateral ankles. Patient is well, but has had some minor complaints of burning pain around the right knee. Denies any CP, SOB, ABD pain. We will continue therapy today.  Plan is to go Home after hospital stay.  Objective: Vital signs in last 24 hours: Temp:  [98.6 F (37 C)-99.1 F (37.3 C)] 98.9 F (37.2 C) (06/15 0723) Pulse Rate:  [80-95] 85 (06/15 0723) Resp:  [18-20] 18 (06/15 0723) BP: (125-159)/(66-77) 159/72 mmHg (06/15 0723) SpO2:  [90 %-94 %] 94 % (06/15 0723)  Intake/Output from previous day: 06/14 0701 - 06/15 0700 In: 240 [P.O.:240] Out: 900 [Urine:900] Intake/Output this shift:     Recent Labs  12/06/15 0350 12/07/15 0405  HGB 12.6* 11.4*    Recent Labs  12/06/15 0350 12/07/15 0405  WBC 11.2* 8.3  RBC 4.04* 3.64*  HCT 36.7* 33.2*  PLT 148* 128*    Recent Labs  12/06/15 0350 12/07/15 0405  NA 136 139  K 4.2 3.6  CL 107 106  CO2 22 26  BUN 15 18  CREATININE 0.88 1.00  GLUCOSE 176* 113*  CALCIUM 8.8* 8.5*   No results for input(s): LABPT, INR in the last 72 hours.  EXAM General - Patient is Alert, Appropriate and Oriented Extremity - Neurologically intact ABD soft Sensation intact distally Intact pulses distally No cellulitis present Very limited ankle dorsiflexion due to pain. Dressing - dressing C/D/I and scant drainage Motor Function - intact, moving foot and toes well on exam.   Past Medical History  Diagnosis Date  . BPH (benign prostatic hyperplasia)   . DJD (degenerative joint disease)   . GERD (gastroesophageal reflux disease)   . Gout   . Hard of hearing   . Hemorrhoids   . Hyperlipidemia   . Hypertension   . Obesity   . Scoliosis   . Lumbar spinal stenosis   . Glucose intolerance (impaired glucose  tolerance)   . Nephrolithiasis   . Personal history of PE (pulmonary embolism)   . Complication of anesthesia     difficult to wake up after a lithotripsy  . PONV (postoperative nausea and vomiting)     after cataract surgery    Assessment/Plan:   3 Days Post-Op Procedure(s) (LRB): COMPUTER ASSISTED TOTAL KNEE ARTHROPLASTY (Right) Active Problems:   S/P total knee arthroplasty  Estimated body mass index is 36.16 kg/(m^2) as calculated from the following:   Height as of this encounter: 5\' 9"  (1.753 m).   Weight as of this encounter: 111.131 kg (245 lb). Advance diet Up with therapy  Pt has had a BM. Pt reporting more pain to the right knee, negative Homan bilaterally. Up with PT today, may need to be discharged to SNF instead of HHPT pending on progress. Follow up with [place] ortho in 2 weeks for staple removal.  DVT Prophylaxis - Lovenox, Foot Pumps and TED hose Weight-Bearing as tolerated to right leg D/C O2 and Pulse OX and try on [place], PA-C [place] 12/08/2015, 7:45 AM

## 2015-12-08 NOTE — Clinical Social Work Note (Signed)
Clinical Social Work Assessment  Patient Details  Name: Thomas Mcdaniel MRN: [id number] Date of Birth: 1946-02-10  Date of referral:  12/08/15               Reason for consult:  Facility Placement                Permission sought to share information with:  Chartered certified accountant granted to share information::  Yes, Verbal Permission Granted  Name::      Thomas Mcdaniel::   [name]   Relationship::     Contact Information:     Housing/Transportation Living arrangements for the past 2 months:  [place] of Information:  Patient, Spouse Patient Interpreter Needed:  None Criminal Activity/Legal Involvement Pertinent to Current Situation/Hospitalization:  No - Comment as needed Significant Relationships:  Spouse Lives with:  Spouse Do you feel safe going back to the place where you live?  Yes Need for family participation in patient care:  Yes (Comment)  Care giving concerns:  Patient lives in [place] with his wife Thomas Mcdaniel.    Social Worker assessment / plan:  Holiday representative ([place]) discussed case with Network engineer and PT. PT is recommending SNF today. CSW met with patient alone at bedside. Patient was alert and oriented and was sitting up in the chair. CSW introduced self and explained role of CSW department. Patient reported that he lives in [place] with his wife Thomas Mcdaniel. CSW explained SNF process. Patient reported that his house is all 1 level and he prefers to go home. CSW explained that MD and PT are recommending patient go to SNF. Patient is agreeable to SNF search in [facility] however he does not have a preferance. Patient gave CSW permission to call his wife. CSW contacted patient's wife and made her aware of above. Per wife patient needs to go to SNF in order to get the best rehab. Wife reported that she is concerned patient will go home and not work as hard as he needs to. Wife is agreeable to SNF  search. CSW explained to wife that patient has met the 3 night inpatient stay criteria as patient was admitted to inpatient on 12/05/15. CSW explained to wife that Medicare will pay for days 1-20 at 100% and days 21-100 at 80%. Wife verbalized her understanding.   FL2 complete and faxed out. CSW will continue to follow and assist as needed.   Employment status:  Retired Forensic scientist:  Medicare PT Recommendations:  [place] / Referral to community resources:  [place]  Patient/Family's Response to care:  Patient and wife are agreeable to SNF search in [place].   Patient/Family's Understanding of and Emotional Response to Diagnosis, Current Treatment, and Prognosis:  Patient and wife were pleasant and thanked CSW for visit.   Emotional Assessment Appearance:  Appears stated age Attitude/Demeanor/Rapport:    Affect (typically observed):  Accepting, Adaptable, Pleasant Orientation:  Oriented to Self, Oriented to Place, Oriented to  Time, Oriented to Situation Alcohol / Substance use:  Not Applicable Psych involvement (Current and /or in the community):  No (Comment)  Discharge Needs  Concerns to be addressed:  Discharge Planning Concerns Readmission within the last 30 days:  No Current discharge risk:  Dependent with Mobility Barriers to Discharge:  Continued Medical Work up   Thomas Freshwater, LCSW 12/08/2015, 11:02 AM

## 2015-12-08 NOTE — Clinical Social Work Placement (Signed)
   CLINICAL SOCIAL WORK PLACEMENT  NOTE  Date:  12/08/2015  Patient Details  Name: Thomas Mcdaniel MRN: [id number] Date of Birth: [birth year]  Clinical Social Work is seeking post-discharge placement for this patient at the [place] level of care (*CSW will initial, date and re-position this form in  chart as items are completed):  Yes   Patient/family provided with [place] Work Department's list of facilities offering this level of care within the geographic area requested by the patient (or if unable, by the patient's family).  Yes   Patient/family informed of their freedom to choose among providers that offer the needed level of care, that participate in Medicare, Medicaid or managed care program needed by the patient, have an available bed and are willing to accept the patient.  Yes   Patient/family informed of [facility]'s ownership interest in [facility] and [facility], as well as of the fact that they are under no obligation to receive care at these facilities.  PASRR submitted to EDS on 12/08/15     PASRR number received on 12/08/15     Existing PASRR number confirmed on       FL2 transmitted to all facilities in geographic area requested by pt/family on 12/08/15     FL2 transmitted to all facilities within larger geographic area on       Patient informed that his/her managed care company has contracts with or will negotiate with certain facilities, including the following:            Patient/family informed of bed offers received.  Patient chooses bed at       Physician recommends and patient chooses bed at      Patient to be transferred to   on  .  Patient to be transferred to facility by       Patient family notified on   of transfer.  Name of family member notified:        PHYSICIAN       Additional Comment:    _______________________________________________ Loralyn Freshwater, LCSW 12/08/2015, 10:59 AM

## 2015-12-08 NOTE — Progress Notes (Signed)
Pt ready for discharge doppler neg. Dr Marry Guan aware. Patient and wife are agreeable to plan of care. Report called to [facility] ,911 called.

## 2015-12-08 NOTE — Care Management (Signed)
I have cancelled Lovenox with [facility] [phone]. Patient pending discharge to SNF. I have also updated [facility] of change in plans.

## 2015-12-08 NOTE — Progress Notes (Signed)
Physical Therapy Treatment Patient Details Name: BARIN SHIRAH MRN: [id number] DOB: [birth year] Today's Date: 12/08/2015    History of Present Illness Pt  was admitted for a R TKR without reported post-op complications. He reports return of sensation to RLE at time of evaluation.     PT Comments    Pt continues up in chair reluctantly and has pushed tray table away attempting to scoot in chair to go to bed. Pt unable to move himself. Agreeable to PT including return to bed. Pt participates in gentle exercises and stretching. Demonstrates good right knee flexion, but regressing right knee extension. Quad set continues poor with trace at best in supine. Pt continues to require Mod to Max A of 2 for sit to/from stand transfers and pivot transfers. Unable to tolerate weight bearing on right to step or pick right leg up to advance Right lower extremity. Poor upright posture. Pt notes dizziness upon return to bed that does not subside with rest. Pt rambles sentences that are comprehendible, but unrelated to tasks currently; pt does respond appropriately when redirected. It is noted pt has not eaten; pt states he has not eaten much in last several days. Encouraged food intake and suggestions for simple more palatable sources. Pt attempting to eat peanut butter crackers currently. Discussed with nursing. Awaiting venous dopplers to be performed. Continue PT for progression of range, strength, balance, transfers, bed mobility with hopeful progression to ambulation again to improve function with eventual return home. Recommendation continues to be skilled nursing facility post hospital stay.   Follow Up Recommendations  SNF     Equipment Recommendations  None recommended by PT    Recommendations for Other Services       Precautions / Restrictions Precautions Precautions: Fall;Knee Restrictions Weight Bearing Restrictions: Yes RLE Weight Bearing: Weight bearing as tolerated    Mobility  Bed  Mobility Overal bed mobility: Needs Assistance Bed Mobility: Sit to Supine     Supine to sit:  (use of trapeze) Sit to supine: Mod assist;+2 for physical assistance;HOB elevated   General bed mobility comments: Requires assist for BLEs and trunk  Transfers Overall transfer level: Needs assistance Equipment used: Rolling walker (2 wheeled) Transfers: Sit to/from [facility] Sit to Stand: Mod assist;+2 physical assistance (from recliner) Stand pivot transfers: Max assist;+2 physical assistance       General transfer comment: Unable to attain full upright stand or bear weight through RLE with attempted small step to transfer chair to bed.; pt attempts sitting way too early, ultimately required Max of 2 pivot transfer to bed. Pt unable to assist with turning/descent.  Ambulation/Gait             General Gait Details: Attempts steps; unable to tolerate weightbearing on RLE   Stairs            Wheelchair Mobility    Modified Rankin (Stroke Patients Only)       Balance           Standing balance support: Bilateral upper extremity supported Standing balance-Leahy Scale: Poor                      Cognition Arousal/Alertness: Awake/alert Behavior During Therapy: WFL for tasks assessed/performed Overall Cognitive Status: Within Functional Limits for tasks assessed                      Exercises Total Joint Exercises Ankle Circles/Pumps: AROM;Left;20 reps (long sit) Quad Sets: Strengthening;Both;20 reps;Supine (  continues with trace QS at best) Long Arc Quad: Right;10 reps;Seated;PROM (partial range) Knee Flexion: AAROM;Right;Seated (stretching x 6 minutes) Goniometric ROM: 7 to 84 degrees Marching in Standing: AAROM;Right;10 reps;Seated    General Comments General comments (skin integrity, edema, etc.): R distal lower extremity continues swollen, shiny, tight, warm to the touch      Pertinent Vitals/Pain Pain Assessment:  0-10 Pain Score: 7  Pain Location: R knee to foot Pain Descriptors / Indicators: Aching;Burning;Constant;Heaviness;Tightness;Operative site guarding Pain Intervention(s): Monitored during session;Limited activity within patient's tolerance;Repositioned;Ice applied    Home Living                      Prior Function            PT Goals (current goals can now be found in the care plan section) Progress towards PT goals: Not progressing toward goals - comment    Frequency  BID    PT Plan Discharge plan needs to be updated    Co-evaluation             End of Session Equipment Utilized During Treatment: Gait belt Activity Tolerance: Patient limited by pain;Other (comment) (pt felt dizzy in bed post transfer; nsg notified.) Patient left: in chair;with call bell/phone within reach;with chair alarm set;Other (comment) (polar care in place)     Time: [id number] PT Time Calculation (min) (ACUTE ONLY): 36 min  Charges:  $Therapeutic Exercise: 8-22 mins $Therapeutic Activity: 8-22 mins                    G Codes:      Charlaine Dalton, PTA 12/08/2015, 2:14 PM

## 2015-12-08 NOTE — NC FL2 (Signed)
[place] LEVEL OF CARE SCREENING TOOL     IDENTIFICATION  Patient Name: Thomas Mcdaniel Birthdate: 1945-10-25 Sex: male Admission Date (Current Location): 12/05/2015  [place] and [place] Number:  Engineering geologist and Address:  [facility], [address] [place], [place] [address]      Provider Number: [id number]  Attending Physician Name and Address:  Dereck Leep, MD  Relative Name and Phone Number:       Current Level of Care: Hospital Recommended Level of Care: [place] Prior Approval Number:    Date Approved/Denied:   PASRR Number:  ([id number])  Discharge Plan: SNF    Current Diagnoses: Patient Active Problem List   Diagnosis Date Noted  . 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   Nephrolithiasis    Gout, arthritis    Impaired glucose tolerance    Hard of hearing    Hemorrhoids    Cataracts, bilateral    GERD (gastroesophageal reflux disease)    Hyperplastic colon polyp       Orientation RESPIRATION BLADDER Height & Weight     Self, Time, Situation, Place  Normal Continent Weight: 245 lb (111.131 kg) Height:  5\' 9"  (175.3 cm)  BEHAVIORAL SYMPTOMS/MOOD NEUROLOGICAL BOWEL NUTRITION STATUS   (none )  (none ) Continent Diet (Diet: Regular )  AMBULATORY STATUS COMMUNICATION OF NEEDS Skin   Extensive Assist Verbally Surgical wounds (Incision: Right Knee )                       Personal Care Assistance Level of Assistance  Bathing, Feeding, Dressing Bathing Assistance: Limited assistance Feeding assistance: Independent Dressing Assistance: Limited  assistance     Functional Limitations Info  Sight, Hearing, Speech Sight Info: Adequate Hearing Info: Adequate Speech Info: Adequate    SPECIAL CARE FACTORS FREQUENCY  PT (By licensed PT), OT (By licensed OT)     PT Frequency:  (5) OT Frequency:  (5)            Contractures      Additional Factors Info  Code Status, Allergies Code Status Info:  (Not on file ) Allergies Info:  (Statins )           Current Medications (12/08/2015):  This is the current hospital active medication list Current Facility-Administered Medications  Medication Dose Route Frequency Provider Last Rate Last Dose  . 0.9 %  sodium chloride infusion   Intravenous Continuous Dereck Leep, MD 100 mL/hr at 12/05/15 1315    . acetaminophen (TYLENOL) tablet 650 mg  650 mg Oral Q6H PRN Dereck Leep, MD       Or  . acetaminophen (TYLENOL) suppository 650 mg  650 mg Rectal Q6H PRN Dereck Leep, MD      . alum & mag hydroxide-simeth (MAALOX/MYLANTA) 200-200-20 MG/5ML suspension 30 mL  30 mL Oral Q4H PRN Dereck Leep, MD   30 mL at 12/07/15 0427  . amLODipine (NORVASC) tablet 10 mg  10 mg Oral Daily Dereck Leep, MD   10 mg at 12/08/15 0841  . bisacodyl (DULCOLAX) suppository 10 mg  10 mg Rectal Daily PRN Dereck Leep, MD   10 mg at 12/07/15 1626  . colchicine tablet 0.6 mg  0.6 mg Oral BID PRN Dereck Leep, MD      . diphenhydrAMINE (BENADRYL) 12.5 MG/5ML elixir 12.5-25 mg  12.5-25 mg Oral Q4H PRN Dereck Leep, MD      . enoxaparin (LOVENOX) injection 30 mg  30 mg Subcutaneous Q12H Dereck Leep, MD   30 mg at 12/08/15 0841  . ferrous sulfate tablet 325 mg  325 mg Oral BID WC Dereck Leep, MD   325 mg at 12/08/15 0841  . lisinopril (PRINIVIL,ZESTRIL) tablet 40 mg  40 mg Oral Daily Dereck Leep, MD   40 mg at 12/08/15 0840  . magnesium hydroxide (MILK OF MAGNESIA) suspension 30 mL  30 mL Oral Daily PRN Dereck Leep, MD   30 mL at 12/06/15 1747  . menthol-cetylpyridinium (CEPACOL) lozenge 3  mg  1 lozenge Oral PRN Dereck Leep, MD       Or  . phenol (CHLORASEPTIC) mouth spray 1 spray  1 spray Mouth/Throat PRN Dereck Leep, MD      . morphine 2 MG/ML injection 2 mg  2 mg Intravenous Q2H PRN Dereck Leep, MD   2 mg at 12/05/15 1245  . ondansetron (ZOFRAN) tablet 4 mg  4 mg Oral Q6H PRN Dereck Leep, MD       Or  . ondansetron (ZOFRAN) injection 4 mg  4 mg Intravenous Q6H PRN Dereck Leep, MD      . oxyCODONE (Oxy IR/ROXICODONE) immediate release tablet 5-10 mg  5-10 mg Oral Q4H PRN Dereck Leep, MD   10 mg at 12/08/15 0851  . pantoprazole (PROTONIX) EC tablet 40 mg  40 mg Oral BID Dereck Leep, MD   40 mg at 12/08/15 0841  . senna-docusate (Senokot-S) tablet 1 tablet  1 tablet Oral BID Dereck Leep, MD   1 tablet at 12/08/15 0841  . sodium phosphate (FLEET) 7-19 GM/118ML enema 1 enema  1 enema Rectal Once PRN Dereck Leep, MD      . traMADol Veatrice Bourbon) tablet 50-100 mg  50-100 mg Oral Q4H PRN Dereck Leep, MD   100 mg at 12/07/15 1626  . vitamin B-12 (CYANOCOBALAMIN) tablet 1,000 mcg  1,000 mcg Oral Daily Dereck Leep, MD   1,000 mcg at 12/08/15 [id number]     Discharge Medications: Please see discharge summary for a list of discharge medications.  Relevant Imaging Results:  Relevant Lab Results:   Additional Information  (SSN: [id number])  Loralyn Freshwater, LCSW

## 2015-12-14 ENCOUNTER — Emergency Department: Payer: Medicare Other

## 2015-12-14 ENCOUNTER — Encounter: Payer: Self-pay | Admitting: Emergency Medicine

## 2015-12-14 ENCOUNTER — Inpatient Hospital Stay
Admission: EM | Admit: 2015-12-14 | Discharge: 2015-12-16 | DRG: 690 | Disposition: A | Discharge status: Home Health | Payer: Medicare Other | Attending: Internal Medicine | Admitting: Internal Medicine

## 2015-12-14 DIAGNOSIS — W010XXA Fall on same level from slipping, tripping and stumbling without subsequent striking against object, initial encounter: Secondary | ICD-10-CM | POA: Diagnosis present

## 2015-12-14 DIAGNOSIS — Z86711 Personal history of pulmonary embolism: Secondary | ICD-10-CM | POA: Diagnosis not present

## 2015-12-14 DIAGNOSIS — Z79899 Other long term (current) drug therapy: Secondary | ICD-10-CM

## 2015-12-14 DIAGNOSIS — R197 Diarrhea, unspecified: Secondary | ICD-10-CM | POA: Diagnosis present

## 2015-12-14 DIAGNOSIS — N400 Enlarged prostate without lower urinary tract symptoms: Secondary | ICD-10-CM | POA: Diagnosis present

## 2015-12-14 DIAGNOSIS — M1990 Unspecified osteoarthritis, unspecified site: Secondary | ICD-10-CM | POA: Diagnosis present

## 2015-12-14 DIAGNOSIS — H9190 Unspecified hearing loss, unspecified ear: Secondary | ICD-10-CM | POA: Diagnosis present

## 2015-12-14 DIAGNOSIS — Z6834 Body mass index (BMI) 34.0-34.9, adult: Secondary | ICD-10-CM

## 2015-12-14 DIAGNOSIS — E669 Obesity, unspecified: Secondary | ICD-10-CM | POA: Diagnosis present

## 2015-12-14 DIAGNOSIS — Z96651 Presence of right artificial knee joint: Secondary | ICD-10-CM | POA: Diagnosis present

## 2015-12-14 DIAGNOSIS — R4182 Altered mental status, unspecified: Secondary | ICD-10-CM | POA: Diagnosis present

## 2015-12-14 DIAGNOSIS — I10 Essential (primary) hypertension: Secondary | ICD-10-CM | POA: Diagnosis present

## 2015-12-14 DIAGNOSIS — Z7901 Long term (current) use of anticoagulants: Secondary | ICD-10-CM | POA: Diagnosis not present

## 2015-12-14 DIAGNOSIS — K219 Gastro-esophageal reflux disease without esophagitis: Secondary | ICD-10-CM | POA: Diagnosis present

## 2015-12-14 DIAGNOSIS — N390 Urinary tract infection, site not specified: Secondary | ICD-10-CM

## 2015-12-14 DIAGNOSIS — T83511A Infection and inflammatory reaction due to indwelling urethral catheter, initial encounter: Secondary | ICD-10-CM

## 2015-12-14 DIAGNOSIS — M109 Gout, unspecified: Secondary | ICD-10-CM | POA: Diagnosis present

## 2015-12-14 DIAGNOSIS — E785 Hyperlipidemia, unspecified: Secondary | ICD-10-CM | POA: Diagnosis present

## 2015-12-14 DIAGNOSIS — M4806 Spinal stenosis, lumbar region: Secondary | ICD-10-CM | POA: Diagnosis present

## 2015-12-14 DIAGNOSIS — M419 Scoliosis, unspecified: Secondary | ICD-10-CM | POA: Diagnosis present

## 2015-12-14 LAB — URINALYSIS COMPLETE WITH MICROSCOPIC (ARMC ONLY)
Bilirubin Urine: NEGATIVE
Glucose, UA: NEGATIVE mg/dL
Ketones, ur: NEGATIVE mg/dL
Nitrite: NEGATIVE
PH: 6 (ref 5.0–8.0)
PROTEIN: NEGATIVE mg/dL
SPECIFIC GRAVITY, URINE: 1.016 (ref 1.005–1.030)
SQUAMOUS EPITHELIAL / LPF: NONE SEEN

## 2015-12-14 LAB — COMPREHENSIVE METABOLIC PANEL
ALBUMIN: 3.4 g/dL — AB (ref 3.5–5.0)
ALK PHOS: 76 U/L (ref 38–126)
ALT: 25 U/L (ref 17–63)
AST: 37 U/L (ref 15–41)
Anion gap: 9 (ref 5–15)
BUN: 31 mg/dL — ABNORMAL HIGH (ref 6–20)
CALCIUM: 9.1 mg/dL (ref 8.9–10.3)
CHLORIDE: 102 mmol/L (ref 101–111)
CO2: 26 mmol/L (ref 22–32)
CREATININE: 1.12 mg/dL (ref 0.61–1.24)
GFR calc Af Amer: >60 mL/min (ref >60)
GFR calc non Af Amer: >60 mL/min (ref >60)
GLUCOSE: 153 mg/dL — AB (ref 65–99)
Potassium: 4.6 mmol/L (ref 3.5–5.1)
SODIUM: 137 mmol/L (ref 135–145)
Total Bilirubin: 1.2 mg/dL (ref 0.3–1.2)
Total Protein: 7.2 g/dL (ref 6.5–8.1)

## 2015-12-14 LAB — CBC
HCT: 33.5 % — ABNORMAL LOW (ref 40.0–52.0)
HEMOGLOBIN: 11.3 g/dL — AB (ref 13.0–18.0)
MCH: 31 pg (ref 26.0–34.0)
MCHC: 33.6 g/dL (ref 32.0–36.0)
MCV: 92.1 fL (ref 80.0–100.0)
Platelets: 315 10*3/uL (ref 150–440)
RBC: 3.64 MIL/uL — AB (ref 4.40–5.90)
RDW: 13.3 % (ref 11.5–14.5)
WBC: 17.4 10*3/uL — AB (ref 3.8–10.6)

## 2015-12-14 MED ORDER — CLINDAMYCIN HCL 150 MG PO CAPS
300.0000 mg | ORAL_CAPSULE | Freq: Three times a day (TID) | ORAL | Status: DC
  Administered 2015-12-14 – 2015-12-16 (×7): 300 mg via ORAL
  Filled 2015-12-14 (×8): qty 2

## 2015-12-14 MED ORDER — DEXTROSE 5 % IV SOLN
1.0000 g | INTRAVENOUS | Status: DC
  Administered 2015-12-14 – 2015-12-16 (×3): 1 g via INTRAVENOUS
  Filled 2015-12-14 (×3): qty 10

## 2015-12-14 MED ORDER — CEFTRIAXONE SODIUM 1 G IJ SOLR
1.0000 g | Freq: Once | INTRAMUSCULAR | Status: AC
  Administered 2015-12-14: 1 g via INTRAVENOUS
  Filled 2015-12-14: qty 10

## 2015-12-14 MED ORDER — AMLODIPINE BESYLATE 10 MG PO TABS
10.0000 mg | ORAL_TABLET | Freq: Every day | ORAL | Status: DC
  Administered 2015-12-14 – 2015-12-16 (×2): 10 mg via ORAL
  Filled 2015-12-14 (×2): qty 1

## 2015-12-14 MED ORDER — ENOXAPARIN SODIUM 40 MG/0.4ML ~~LOC~~ SOLN: 40.0000 mg | SUBCUTANEOUS | Status: DC

## 2015-12-14 MED ORDER — SODIUM CHLORIDE 0.9% FLUSH
3.0000 mL | Freq: Two times a day (BID) | INTRAVENOUS | Status: DC
  Administered 2015-12-14 – 2015-12-16 (×4): 3 mL via INTRAVENOUS

## 2015-12-14 MED ORDER — SODIUM CHLORIDE 0.9% FLUSH
3.0000 mL | INTRAVENOUS | Status: DC | PRN
  Administered 2015-12-15: 3 mL via INTRAVENOUS
  Filled 2015-12-14: qty 3

## 2015-12-14 MED ORDER — VITAMIN B-12 1000 MCG PO TABS
1000.0000 ug | ORAL_TABLET | Freq: Every day | ORAL | Status: DC
  Administered 2015-12-14 – 2015-12-16 (×3): 1000 ug via ORAL
  Filled 2015-12-14 (×3): qty 1

## 2015-12-14 MED ORDER — SODIUM CHLORIDE 0.9 % IV SOLN: 250.0000 mL | INTRAVENOUS | Status: DC | PRN

## 2015-12-14 MED ORDER — ACETAMINOPHEN 325 MG PO TABS
650.0000 mg | ORAL_TABLET | Freq: Four times a day (QID) | ORAL | Status: DC | PRN
  Administered 2015-12-15: 650 mg via ORAL
  Filled 2015-12-14: qty 2

## 2015-12-14 MED ORDER — ENOXAPARIN SODIUM 40 MG/0.4ML ~~LOC~~ SOLN
40.0000 mg | SUBCUTANEOUS | Status: DC
  Administered 2015-12-14 – 2015-12-16 (×3): 40 mg via SUBCUTANEOUS
  Filled 2015-12-14: qty 0.6
  Filled 2015-12-14: qty 0.4
  Filled 2015-12-14: qty 0.6
  Filled 2015-12-14: qty 0.4
  Filled 2015-12-14: qty 0.6

## 2015-12-14 MED ORDER — ACETAMINOPHEN 650 MG RE SUPP
650.0000 mg | Freq: Four times a day (QID) | RECTAL | Status: DC | PRN
Start: 2015-12-14 — End: 2015-12-16

## 2015-12-14 MED ORDER — COLCHICINE 0.6 MG PO TABS
0.6000 mg | ORAL_TABLET | Freq: Every day | ORAL | Status: DC
  Administered 2015-12-14 – 2015-12-16 (×3): 0.6 mg via ORAL
  Filled 2015-12-14 (×3): qty 1

## 2015-12-14 MED ORDER — LISINOPRIL 20 MG PO TABS
40.0000 mg | ORAL_TABLET | Freq: Every day | ORAL | Status: DC
  Administered 2015-12-14 – 2015-12-16 (×2): 40 mg via ORAL
  Filled 2015-12-14 (×2): qty 2

## 2015-12-14 MED ORDER — FAMOTIDINE 20 MG PO TABS
40.0000 mg | ORAL_TABLET | Freq: Every day | ORAL | Status: DC
  Administered 2015-12-14 – 2015-12-16 (×3): 40 mg via ORAL
  Filled 2015-12-14 (×3): qty 2

## 2015-12-14 MED ORDER — OXYCODONE HCL 5 MG PO TABS: 5.0000 mg | ORAL_TABLET | ORAL | Status: DC | PRN

## 2015-12-14 NOTE — Consult Note (Signed)
ORTHOPAEDICS: The patient is 8 days status post right total knee arthroplasty. He had been transferred to [facility] for rehabilitation and apparently left AGAINST MEDICAL ADVICE. According to his wife, the patient had some confusion and agitation while at the skilled nursing facility. The patient apparently fell at home and was brought to the Emergency Department by EMS for evaluation. He has been admitted for treatment of a urinary tract infection and confusion.  The patient denies any significant right knee pain.  Examination: The patient is awake and alert but somewhat confused. Examination of the right knee shows the skin edges to be well approximated with skin staples. No significant erythema. Mild swelling is noted to the knee. The patient is able to perform an independent straight leg raise. Homans test is negative. 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  X-rays: I reviewed AP and lateral radiographs of the right knee that were obtained at [facility] earlier today. Total knee implants are in good position. No evidence of fracture. Staples are noted.  Impression: Status post right total knee arthroplasty - no evidence of infection or wound complications. Urinary tract infection - management as per Medicine Confusion - etiology unknown. Would avoid narcotics.  Plan: Orders have been placed to resume physical therapy for gait training, lower extremity strengthening, and total knee arthroplasty rehabilitation protocol.  Orders were also placed for TED stockings. The patient's wife will bring the Polar Care so as to continue with cold therapy to the right knee. The patient may be weightbearing as tolerated under supervision.  Although the confusion may be related to the urinary tract infection, I would recommend avoiding all narcotics at this time and rely primarily on Tylenol for pain control.  I reviewed the  situation with the patient and his wife.  James P. Holley Bouche M.D.

## 2015-12-14 NOTE — H&P (Signed)
SHOWN Thomas Mcdaniel is an 70 y.o. male.   Chief Complaint: Confusion HPI: Recent right knee replacement. Left rehab AMA. Has been confused and occasionally combative. Found to have UTI in ED. Patient complained of fever for past few days. Also said he feel earlier today. Complains of left ankle pain.  Past Medical History  Diagnosis Date  . BPH (benign prostatic hyperplasia)   . DJD (degenerative joint disease)   . GERD (gastroesophageal reflux disease)   . Gout   . Hard of hearing   . Hemorrhoids   . Hyperlipidemia   . Hypertension   . Obesity   . Scoliosis   . Lumbar spinal stenosis   . Glucose intolerance (impaired glucose tolerance)   . Nephrolithiasis   . Personal history of PE (pulmonary embolism)   . Complication of anesthesia     difficult to wake up after a lithotripsy  . PONV (postoperative nausea and vomiting)     after cataract surgery    Past Surgical History  Procedure Laterality Date  . Laminectomy    . Femoral artery repair    . Cataract extraction    . Toe amputation    . Spinal fusion    . Tonsillectomy    . Lithotripsy    . Knee arthroscopy    . Colonoscopy with propofol N/A 08/01/2015    Procedure: COLONOSCOPY WITH PROPOFOL;  Surgeon: Josefine Class, MD;  Location: [facility] ENDOSCOPY;  Service: Endoscopy;  Laterality: N/A;  . Hernia repair    . Lithotripsy    . Knee arthroplasty Right 12/05/2015    Procedure: COMPUTER ASSISTED TOTAL KNEE ARTHROPLASTY;  Surgeon: Dereck Leep, MD;  Location: [facility] ORS;  Service: Orthopedics;  Laterality: Right;    History reviewed. No pertinent family history.  No CAD Social History:  reports that he quit smoking about 23 years ago. He has never used smokeless tobacco. He reports that he does not drink alcohol or use illicit drugs.  Allergies:  Allergies  Allergen Reactions  . Statins      (Not in a hospital admission)  Results for orders placed or performed during the hospital encounter of 12/14/15 (from the past  48 hour(s))  CBC     Status: Abnormal   Collection Time: 12/14/15  4:10 AM  Result Value Ref Range   WBC 17.4 (H) 3.8 - 10.6 K/uL   RBC 3.64 (L) 4.40 - 5.90 MIL/uL   Hemoglobin 11.3 (L) 13.0 - 18.0 g/dL   HCT 33.5 (L) 40.0 - 52.0 %   MCV 92.1 80.0 - 100.0 fL   MCH 31.0 26.0 - 34.0 pg   MCHC 33.6 32.0 - 36.0 g/dL   RDW 13.3 11.5 - 14.5 %   Platelets 315 150 - 440 K/uL  Comprehensive metabolic panel     Status: Abnormal   Collection Time: 12/14/15  4:10 AM  Result Value Ref Range   Sodium 137 135 - 145 mmol/L   Potassium 4.6 3.5 - 5.1 mmol/L   Chloride 102 101 - 111 mmol/L   CO2 26 22 - 32 mmol/L   Glucose, Bld 153 (H) 65 - 99 mg/dL   BUN 31 (H) 6 - 20 mg/dL   Creatinine, Ser 1.12 0.61 - 1.24 mg/dL   Calcium 9.1 8.9 - 10.3 mg/dL   Total Protein 7.2 6.5 - 8.1 g/dL   Albumin 3.4 (L) 3.5 - 5.0 g/dL   AST 37 15 - 41 U/L   ALT 25 17 - 63 U/L  Alkaline Phosphatase 76 38 - 126 U/L   Total Bilirubin 1.2 0.3 - 1.2 mg/dL   GFR calc non Af Amer >60 >60 mL/min   GFR calc Af Amer >60 >60 mL/min    Comment: (NOTE) The eGFR has been calculated using the CKD EPI equation. This calculation has not been validated in all clinical situations. eGFR's persistently <60 mL/min signify possible Chronic Kidney Disease.    Anion gap 9 5 - 15  Urinalysis complete, with microscopic ([facility] only)     Status: Abnormal   Collection Time: 12/14/15  5:20 AM  Result Value Ref Range   Color, Urine YELLOW (A) YELLOW   APPearance HAZY (A) CLEAR   Glucose, UA NEGATIVE NEGATIVE mg/dL   Bilirubin Urine NEGATIVE NEGATIVE   Ketones, ur NEGATIVE NEGATIVE mg/dL   Specific Gravity, Urine 1.016 1.005 - 1.030   Hgb urine dipstick 2+ (A) NEGATIVE   pH 6.0 5.0 - 8.0   Protein, ur NEGATIVE NEGATIVE mg/dL   Nitrite NEGATIVE NEGATIVE   Leukocytes, UA 2+ (A) NEGATIVE   RBC / HPF 0-5 0 - 5 RBC/hpf   WBC, UA TOO NUMEROUS TO COUNT 0 - 5 WBC/hpf   Bacteria, UA RARE (A) NONE SEEN   Squamous Epithelial / LPF NONE SEEN NONE  SEEN   WBC Clumps PRESENT    Mucous PRESENT    Dg Knee 2 Views Right  12/14/2015  CLINICAL DATA:  70 year old male with right knee pain. EXAM: RIGHT KNEE - 1-2 VIEW COMPARISON:  Radiograph dated 12/05/2015 FINDINGS: There is a total right knee arthroplasty. There is no acute fracture or dislocation. The bones are mildly osteopenic. There is a small suprapatellar effusion. There is small pocket of retropatellar air. There has been interval removal of the drainage tube. Skin staples noted over the knee. IMPRESSION: Total right knee arthroplasty with a small suprapatellar effusion. interval removal of the drainage catheter. No acute fracture or dislocation. Electronically Signed   By: Anner Crete M.D.   On: 12/14/2015 04:11   Ct Head Wo Contrast  12/14/2015  CLINICAL DATA:  70 year old male with altered mental status EXAM: CT HEAD WITHOUT CONTRAST TECHNIQUE: Contiguous axial images were obtained from the base of the skull through the vertex without intravenous contrast. COMPARISON:  None FINDINGS: The ventricles and sulci are appropriate in size for patient's age. Mild periventricular and deep white matter chronic microvascular ischemic changes noted. There is no acute intracranial hemorrhage. No mass effect or midline shift. The visualized paranasal sinuses and mastoid air cells are clear. The calvarium is intact. IMPRESSION: No acute intracranial hemorrhage. 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 If symptoms persist and there are no contraindications, MRI may provide better evaluation if clinically indicated Electronically Signed   By: Anner Crete M.D.   On: 12/14/2015 05:39    Review of Systems  Constitutional: Positive for fever.  HENT: Negative for hearing loss.   Eyes: Negative for blurred vision.  Respiratory: Negative for cough and shortness of breath.   Cardiovascular: Negative for chest pain.  Gastrointestinal: Negative for nausea and vomiting.   Genitourinary: Positive for dysuria.  Musculoskeletal: Positive for joint pain.  Skin: Negative for rash.  Neurological: Negative for sensory change.    Blood pressure 123/70, pulse 93, temperature 98.4 F (36.9 C), temperature source Oral, resp. rate 15, height '5\' 9"'$  (1.753 m), weight 105.688 kg (233 lb), SpO2 92 %. Physical Exam  Constitutional: He appears well-developed and well-nourished. He appears distressed.  HENT:  Head: Normocephalic  and atraumatic.  Mouth/Throat: Oropharynx is clear and moist. No oropharyngeal exudate.  Eyes: EOM are normal. Pupils are equal, round, and reactive to light.  Neck: Neck supple. No JVD present. No tracheal deviation present. No thyromegaly present.  Cardiovascular: Normal rate.   No murmur heard. Respiratory: Effort normal and breath sounds normal. No respiratory distress. He exhibits no tenderness.  GI: Soft. Bowel sounds are normal. There is no tenderness.  Musculoskeletal: He exhibits edema. He exhibits no tenderness.  Right knee s/p surgery with stables in place. No edema, erythema, no drainage  Lymphadenopathy:    He has no cervical adenopathy.  Neurological: He is alert. No cranial nerve deficit.  Mildly confused  Skin: Skin is warm and dry.     Assessment/Plan 1. AMS: Likely secondary to UTI. Able to answer some questions appropriately but is mildly confused about others. Suspect will clear with treatment of UTI.  2. UTI: Start IV rocephin and culture urine.  3. S/P Right Total Knee Replacement: Still has stables in. Will consult Dr Marry Guan for follow up. Have restarted DVT prophylaxisis   4. GERD: On PPI.  Time spent: 45 min  Baxter Hire, MD 12/14/2015, 6:55 AM

## 2015-12-14 NOTE — Progress Notes (Signed)
[facility] [place] NAME: Thomas Mcdaniel    MR#:  [id number]  DATE OF BIRTH:  1945/10/04  SUBJECTIVE:  CHIEF COMPLAINT:   Chief Complaint  Patient presents with  . Fall    REVIEW OF SYSTEMS:    Review of Systems  Constitutional: Positive for chills and malaise/fatigue. Negative for fever.  HENT: Negative for sore throat.   Eyes: Negative for blurred vision, double vision and pain.  Respiratory: Negative for cough, hemoptysis, shortness of breath and wheezing.   Cardiovascular: Negative for chest pain, palpitations, orthopnea and leg swelling.  Gastrointestinal: Negative for heartburn, nausea, vomiting, abdominal pain, diarrhea and constipation.  Genitourinary: Negative for dysuria and hematuria.  Musculoskeletal: Positive for back pain and joint pain.  Skin: Negative for rash.  Neurological: Positive for weakness. Negative for sensory change, speech change, focal weakness and headaches.  Endo/Heme/Allergies: Does not bruise/bleed easily.  Psychiatric/Behavioral: Negative for depression. The patient is not nervous/anxious.     DRUG ALLERGIES:   Allergies  Allergen Reactions  . Statins     VITALS:  Blood pressure 162/83, pulse 90, temperature 98.4 F (36.9 C), temperature source Oral, resp. rate 15, height 5\' 9"  (1.753 m), weight 105.688 kg (233 lb), SpO2 99 %.  PHYSICAL EXAMINATION:   Physical Exam  GENERAL:  70 y.o.-year-old patient lying in the bed with no acute distress. Drowsy EYES: Pupils equal, round, reactive to light and accommodation. No scleral icterus. Extraocular muscles intact.  HEENT: Head atraumatic, normocephalic. Oropharynx and nasopharynx clear.  NECK:  Supple, no jugular venous distention. No thyroid enlargement, no tenderness.  LUNGS: Normal breath sounds bilaterally, no wheezing, rales, rhonchi. No use of accessory muscles of respiration.  CARDIOVASCULAR: S1, S2 normal. No murmurs, rubs, or  gallops.  ABDOMEN: Soft, nontender, nondistended. Bowel sounds present. No organomegaly or mass.  EXTREMITIES: No cyanosis, clubbing or edema b/l.   Right knee swollen and warm. NEUROLOGIC: Cranial nerves II through XII are intact. No focal Motor or sensory deficits b/l.   PSYCHIATRIC: The patient is alert and oriented x 3. But drowsy SKIN: No obvious rash, lesion, or ulcer.   LABORATORY PANEL:   CBC  Recent Labs Lab 12/14/15 0410  WBC 17.4*  HGB 11.3*  HCT 33.5*  PLT 315   ------------------------------------------------------------------------------------------------------------------ Chemistries   Recent Labs Lab 12/14/15 0410  NA 137  K 4.6  CL 102  CO2 26  GLUCOSE 153*  BUN 31*  CREATININE 1.12  CALCIUM 9.1  AST 37  ALT 25  ALKPHOS 76  BILITOT 1.2   ------------------------------------------------------------------------------------------------------------------  Cardiac Enzymes No results for input(s): TROPONINI in the last 168 hours. ------------------------------------------------------------------------------------------------------------------  RADIOLOGY:  Dg Knee 2 Views Right  12/14/2015  CLINICAL DATA:  70 year old male with right knee pain. EXAM: RIGHT KNEE - 1-2 VIEW COMPARISON:  Radiograph dated 12/05/2015 FINDINGS: There is a total right knee arthroplasty. There is no acute fracture or dislocation. The bones are mildly osteopenic. There is a small suprapatellar effusion. There is small pocket of retropatellar air. There has been interval removal of the drainage tube. Skin staples noted over the knee. IMPRESSION: Total right knee arthroplasty with a small suprapatellar effusion. interval removal of the drainage catheter. No acute fracture or dislocation. Electronically Signed   By: Anner Crete M.D.   On: 12/14/2015 04:11   Ct Head Wo Contrast  12/14/2015  CLINICAL DATA:  70 year old male with altered mental status EXAM: CT HEAD WITHOUT CONTRAST  TECHNIQUE: Contiguous axial images were  obtained from the base of the skull through the vertex without intravenous contrast. COMPARISON:  None FINDINGS: The ventricles and sulci are appropriate in size for patient's age. Mild periventricular and deep white matter chronic microvascular ischemic changes noted. There is no acute intracranial hemorrhage. No mass effect or midline shift. The visualized paranasal sinuses and mastoid air cells are clear. The calvarium is intact. IMPRESSION: No acute intracranial hemorrhage. 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 If symptoms persist and there are no contraindications, MRI may provide better evaluation if clinically indicated Electronically Signed   By: Anner Crete M.D.   On: 12/14/2015 05:39     ASSESSMENT AND PLAN:   * UTI with acute encephalopathy IV ceftriaxone. Urine cultures sent today Afebrile. Elevated WBC. Repeat labs in the morning. Blood cultures pending.  * S/P Right Total Knee Replacement: Still has stables in. Will consult Dr Marry Guan for follow up. Have restarted DVT prophylaxisis  * Hypertension Continue home  *  GERD: On PPI.  All the records are reviewed and case discussed with Care Management/Social Workerr. Management plans discussed with the patient, family and they are in agreement.  CODE STATUS: FULL  DVT Prophylaxis: SCDs  TOTAL TIME TAKING CARE OF THIS PATIENT: 35 minutes.   POSSIBLE D/C IN 30 DAYS, DEPENDING ON CLINICAL CONDITION.  Hillary Bow R M.D on 12/14/2015 at 3:45 PM  Between 7am to 6pm - Pager - [phone]  After 6pm go to [email or web address] - password [email or web address] [facility]  Office  [phone]  CC: Primary care physician; Valera Castle, MD  Note: This dictation was prepared with Dragon dictation along with smaller phrase technology. Any transcriptional errors that result from this process are unintentional.

## 2015-12-14 NOTE — ED Notes (Signed)
Patient transported to CT 

## 2015-12-14 NOTE — ED Notes (Addendum)
Pt arrived by EMS from home, post fall from bed to floor. EMS reports pt had knee surgery x1 week ago, was at [facility] for re[facility], checked self out today AMA, fell around 0230 from bed to floor. EMS states pt had combative episodes at [facility] that were unexplained, pt was taken of percocet on Sunday, did not have another combative episode. Pt A&O x4 on arrival to ED, right knee assessed by Dr. Owens Shark at bedside, incision clean, no drainage noted. As pt assessed by MD, pt became disoriented to situation and events current.

## 2015-12-14 NOTE — Care Management (Signed)
Patient presents to [facility] from home after leaving [facility] AMA after joint replacement. Dr. Marry Guan notified of patient admission. RNCM will continue to follow. Patient confused and possible UTI per H & P.

## 2015-12-14 NOTE — ED Provider Notes (Signed)
[facility] Emergency Department Provider Note  ____________________________________________  Time seen: 3:25 AM  I have reviewed the triage vital signs and the nursing notes.   HISTORY  Chief Complaint Fall    HPI Thomas Mcdaniel is a 70 y.o. male with history of recent right knee surgery performed by Dr. Marry Guan on 12/07/2015 presents with fall tonight at approximately 2:30 AM. Patient states that he was in route to the bathroom when he accidentally tripped and fell. Patient states that he had considerable knee pain at the time of fall however that has since improved current pain score 4 out of 10 located in the right knee. Per EMS patient's wife states that he's had very bizarre behavior for "couple days". Patient was presents to the emergency department and stated that the patient did not trip and fall that he has been very confused lately with visual hallucinations and nonsensical thoughts. She states that the patient left AGAINST MEDICAL ADVICE from [facility] where he was obtained rehabilitation/physical therapy status post his knee surgery. She stated that she thought this was initially secondary to pain medications namely oxycodone and tramadol however both of since been discontinued and the patient's symptoms have persisted. Patient with some nonsensical thought pattern during evaluation.     Past Medical History  Diagnosis Date  . BPH (benign prostatic hyperplasia)   . DJD (degenerative joint disease)   . GERD (gastroesophageal reflux disease)   . Gout   . Hard of hearing   . Hemorrhoids   . Hyperlipidemia   . Hypertension   . Obesity   . Scoliosis   . Lumbar spinal stenosis   . Glucose intolerance (impaired glucose tolerance)   . Nephrolithiasis   . Personal history of PE (pulmonary embolism)   . Complication of anesthesia     difficult to wake up after a lithotripsy  . PONV (postoperative nausea and vomiting)     after cataract surgery     Patient Active Problem List   Diagnosis Date Noted  . 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 Laminectomy    . Femoral artery repair    . Cataract extraction    . Toe amputation    . Spinal fusion    . Tonsillectomy    . Lithotripsy    . Knee arthroscopy    . Colonoscopy with propofol N/A 08/01/2015    Procedure: COLONOSCOPY WITH PROPOFOL;  Surgeon: Josefine Class, MD;  Location: [facility] ENDOSCOPY;  Service: Endoscopy;  Laterality: N/A;  . Hernia repair    . Lithotripsy    . Knee arthroplasty Right 12/05/2015    Procedure: COMPUTER ASSISTED TOTAL KNEE ARTHROPLASTY;  Surgeon: Dereck Leep, MD;  Location: [facility] ORS;  Service: Orthopedics;  Laterality: Right;    Current Outpatient Rx  Name  Route  Sig  Dispense  Refill  . amLODipine (NORVASC) 10 MG tablet   Oral   Take 10 mg by mouth daily.         . clindamycin (CLEOCIN) 300 MG capsule   Oral   Take 300 mg by mouth 3 (three) times daily.         . colchicine 0.6 MG tablet   Oral   Take 0.6 mg by mouth 2 (two) times daily as needed.          . enoxaparin (LOVENOX) 40 MG/0.4ML injection   Subcutaneous   Inject 0.4 mLs (40 mg total) into  the skin daily.   14 Syringe   0   . famotidine (PEPCID) 40 MG tablet   Oral   Take 40 mg by mouth daily as needed.          Marland Kitchen lisinopril (PRINIVIL,ZESTRIL) 40 MG tablet   Oral   Take 40 mg by mouth daily.         Marland Kitchen oxyCODONE (OXY IR/ROXICODONE) 5 MG immediate release tablet   Oral   Take 1-2 tablets (5-10 mg total) by mouth every 4 (four) hours as needed for severe pain or breakthrough pain.   60 tablet   0   . traMADol (ULTRAM) 50 MG tablet   Oral   Take 1-2 tablets (50-100 mg total) by mouth every 4 (four) hours as needed for moderate pain.   60 tablet   1   . vitamin B-12 (CYANOCOBALAMIN) 1000 MCG tablet   Oral   Take 1,000 mcg by mouth daily.           Allergies Statins  History reviewed. No  pertinent family history.  Social History Social History  Substance Use Topics  . Smoking status: Former Smoker    Quit date: 07/31/1992  . Smokeless tobacco: Never Used  . Alcohol Use: No    Review of Systems  Constitutional: Negative for fever. Eyes: Negative for visual changes. ENT: Negative for sore throat. Cardiovascular: Negative for chest pain. Respiratory: Negative for shortness of breath. Gastrointestinal: Negative for abdominal pain, vomiting and diarrhea. Genitourinary: Negative for dysuria. Musculoskeletal: Negative for back pain. Skin: Negative for rash. Neurological: Negative for headaches, focal weakness or numbness. Psychiatric:Positive for confusion  10-point ROS otherwise negative.  ____________________________________________   PHYSICAL EXAM:  VITAL SIGNS: ED Triage Vitals  Enc Vitals Group     BP 12/14/15 0320 144/65 mmHg     Pulse Rate 12/14/15 0320 93     Resp 12/14/15 0320 15     Temp 12/14/15 0320 98.4 F (36.9 C)     Temp Source 12/14/15 0320 Oral     SpO2 12/14/15 0318 99 %     Weight 12/14/15 0320 233 lb (105.688 kg)     Height 12/14/15 0320 5\' 9"  (1.753 m)     Head Cir --      [facility] --      Pain Score 12/14/15 0321 5     Pain Loc --      Pain Edu? --      Excl. in [place]? --     Constitutional: Alert and oriented. Well appearing and in no distress. Eyes: Conjunctivae are normal. PERRL. Normal extraocular movements. ENT   Head: Normocephalic and atraumatic.   Nose: No congestion/rhinnorhea.   Mouth/Throat: Mucous membranes are moist.   Neck: No stridor. Hematological/Lymphatic/Immunilogical: No cervical lymphadenopathy. Cardiovascular: Normal rate, regular rhythm. Normal and symmetric distal pulses are present in all extremities. No murmurs, rubs, or gallops. Respiratory: Normal respiratory effort without tachypnea nor retractions. Breath sounds are clear and equal bilaterally. No  wheezes/rales/rhonchi. Gastrointestinal: Soft and nontender. No distention. There is no CVA tenderness. Genitourinary: deferred Musculoskeletal: Nontender with normal range of motion in all extremities. No joint effusions.  No lower extremity tenderness nor edema.Right knee wound clean dry and intact with staples no evidence of infection at this time Neurologic:  Normal speech and language. No gross focal neurologic deficits are appreciated. Speech is normal.  Skin:  Skin is warm, dry and intact. No rash noted. Psychiatric: Mood and affect are normal. Speech normal. Patient were  nonsensical thoughts ____________________________________________    LABS (pertinent positives/negatives)  Labs Reviewed  CBC - Abnormal; Notable for the following:    WBC 17.4 (*)    RBC 3.64 (*)    Hemoglobin 11.3 (*)    HCT 33.5 (*)    All other components within normal limits  COMPREHENSIVE METABOLIC PANEL - Abnormal; Notable for the following:    Glucose, Bld 153 (*)    BUN 31 (*)    Albumin 3.4 (*)    All other components within normal limits  URINALYSIS COMPLETEWITH MICROSCOPIC ([facility] ONLY) - Abnormal; Notable for the following:    Color, Urine YELLOW (*)    APPearance HAZY (*)    Hgb urine dipstick 2+ (*)    Leukocytes, UA 2+ (*)    Bacteria, UA RARE (*)    All other components within normal limits  CULTURE, BLOOD (ROUTINE X 2)  CULTURE, BLOOD (ROUTINE X 2)        RADIOLOGY  CT Head Wo Contrast (Final result) Result time: 12/14/15 05:39:10   Final result by Rad Results In Interface (12/14/15 05:39:10)   Narrative:   CLINICAL DATA: 70 year old male with altered mental status  EXAM: CT HEAD WITHOUT CONTRAST  TECHNIQUE: Contiguous axial images were obtained from the base of the skull through the vertex without intravenous contrast.  COMPARISON: None  FINDINGS: The ventricles and sulci are appropriate in size for patient's age. Mild periventricular and deep white matter chronic  microvascular ischemic changes noted. There is no acute intracranial hemorrhage. No mass effect or midline shift.  The visualized paranasal sinuses and mastoid air cells are clear. The calvarium is intact.  IMPRESSION: No acute intracranial hemorrhage.  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  If symptoms persist and there are no contraindications, MRI may provide better evaluation if clinically indicated   Electronically Signed By: Anner Crete M.D. On: 12/14/2015 05:39          DG Knee 2 Views Right (Final result) Result time: 12/14/15 04:11:59   Final result by Rad Results In Interface (12/14/15 04:11:59)   Narrative:   CLINICAL DATA: 70 year old male with right knee pain.  EXAM: RIGHT KNEE - 1-2 VIEW  COMPARISON: Radiograph dated 12/05/2015  FINDINGS: There is a total right knee arthroplasty. There is no acute fracture or dislocation. The bones are mildly osteopenic. There is a small suprapatellar effusion. There is small pocket of retropatellar air. There has been interval removal of the drainage tube. Skin staples noted over the knee.  IMPRESSION: Total right knee arthroplasty with a small suprapatellar effusion. interval removal of the drainage catheter. No acute fracture or dislocation.   Electronically Signed By: Anner Crete M.D. On: 12/14/2015 04:11        INITIAL IMPRESSION / ASSESSMENT AND PLAN / ED COURSE  Pertinent labs & imaging results that were available during my care of the patient were reviewed by me and considered in my medical decision making (see chart for details).  Patient with altered mental status status post right knee surgery, leukocytosis white blood cell count 17, urinalysis consistent with urinary tract infection as such patient received ceftriaxone 1 g. Patient discussed with Dr. Wynetta Emery For hospital admission for further evaluation and  management.  ____________________________________________   FINAL CLINICAL IMPRESSION(S) / ED DIAGNOSES  Final diagnoses:  Altered mental status, unspecified altered mental status type  Urinary tract infection associated with catheterization of urinary tract, initial encounter      Gregor Hams, MD 12/14/15 [phone]

## 2015-12-14 NOTE — ED Notes (Signed)
Snack and drink provided   Pt to be admitted to inpt  He denies pain at this time  NAD assessed  Helped with repositioning   continue to monitor

## 2015-12-14 NOTE — ED Notes (Signed)
Report received from [facility] observed lying in bed with his eyes closed  NAD  VSS continue to monitor

## 2015-12-14 NOTE — Clinical Social Work Note (Addendum)
Clinical Social Work Assessment  Patient Details  Name: Thomas Mcdaniel MRN: [id number] Date of Birth: Feb 22, 1946  Date of referral:  12/14/15               Reason for consult:  Facility Placement, Other (Comment Required) (Recently at [facility] )                Permission sought to share information with:  Facility Art therapist granted to share information::  No  Name::        Agency::     Relationship::     Contact Information:     Housing/Transportation Living arrangements for the past 2 months:  [place], [place] of Information:  Patient, Spouse Patient Interpreter Needed:  None Criminal Activity/Legal Involvement Pertinent to Current Situation/Hospitalization:  No - Comment as needed Significant Relationships:  Spouse Lives with:  Spouse Do you feel safe going back to the place where you live?  Yes Need for family participation in patient care:  Yes (Comment)  Care giving concerns:  Patient was recently at [facility] and left AMA and fell at home and came back to [facility].    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 Patient went to [facility] and left AMA and fell at home. Clinical Social Worker (CSW) met with patient to discuss D/C plan. Patient was alert and oriented and was laying in the bed. CSW introduced self and explained role of CSW department. Patient reported that he did not like [facility] and they "almost killed him." Per patient he is going home from [facility] and does not want to go to another SNF. CSW contacted patient's wife Thomas Mcdaniel. Per wife patient is confused and has a UTI. Wife stated that patient can come home if he can walk. Per wife she can take care of patient however she cannot lift him. Patient will need a PT consult to determine when he is capable of. CSW will continue to follow and assist as needed.   Employment status:  Retired Forensic scientist:  Medicare PT Recommendations:  Not assessed  at this time [place] / Referral to community resources:  Other (Comment Required) (Patient is refusing SNF. )  Patient/Family's Response to care:  Patient is adamant about going home and wife states that patient must be able to walk in order to come home.   Patient/Family's Understanding of and Emotional Response to Diagnosis, Current Treatment, and Prognosis: Wife was pleasant and CSW provided emotional support to patient and wife.   Emotional Assessment Appearance:  Appears stated age Attitude/Demeanor/Rapport:    Affect (typically observed):  Accepting, Adaptable, Pleasant Orientation:  Oriented to Self, Oriented to Place, Oriented to  Time, Oriented to Situation Alcohol / Substance use:  Not Applicable Psych involvement (Current and /or in the community):  No (Comment)  Discharge Needs  Concerns to be addressed:  Discharge Planning Concerns Readmission within the last 30 days:  Yes Current discharge risk:  Dependent with Mobility Barriers to Discharge:  Continued Medical Work up   Loralyn Freshwater, LCSW 12/14/2015, 4:55 PM

## 2015-12-14 NOTE — ED Notes (Signed)
Report called to [facility]  Room 150

## 2015-12-15 LAB — CBC WITH DIFFERENTIAL/PLATELET
Basophils Absolute: 0.1 10*3/uL (ref 0–0.1)
Basophils Relative: 0 %
Eosinophils Absolute: 0.1 10*3/uL (ref 0–0.7)
Eosinophils Relative: 1 %
HEMATOCRIT: 29.5 % — AB (ref 40.0–52.0)
HEMOGLOBIN: 10.3 g/dL — AB (ref 13.0–18.0)
LYMPHS ABS: 1.3 10*3/uL (ref 1.0–3.6)
MCH: 31.8 pg (ref 26.0–34.0)
MCHC: 35 g/dL (ref 32.0–36.0)
MCV: 91 fL (ref 80.0–100.0)
Monocytes Absolute: 1.1 10*3/uL — ABNORMAL HIGH (ref 0.2–1.0)
Neutro Abs: 18 10*3/uL — ABNORMAL HIGH (ref 1.4–6.5)
PLATELETS: 267 10*3/uL (ref 150–440)
RBC: 3.24 MIL/uL — AB (ref 4.40–5.90)
RDW: 13.3 % (ref 11.5–14.5)
WBC: 20.6 10*3/uL — AB (ref 3.8–10.6)

## 2015-12-15 LAB — BASIC METABOLIC PANEL
ANION GAP: 7 (ref 5–15)
BUN: 22 mg/dL — ABNORMAL HIGH (ref 6–20)
CALCIUM: 8.8 mg/dL — AB (ref 8.9–10.3)
CO2: 26 mmol/L (ref 22–32)
Chloride: 102 mmol/L (ref 101–111)
Creatinine, Ser: 0.94 mg/dL (ref 0.61–1.24)
GLUCOSE: 124 mg/dL — AB (ref 65–99)
Potassium: 4.2 mmol/L (ref 3.5–5.1)
Sodium: 135 mmol/L (ref 135–145)

## 2015-12-15 MED ORDER — GUAIFENESIN 100 MG/5ML PO SOLN
5.0000 mL | ORAL | Status: DC | PRN
  Administered 2015-12-15: 100 mg via ORAL
  Filled 2015-12-15: qty 10
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
## 2015-12-15 NOTE — Progress Notes (Signed)
PT is recommending SNF. Clinical Social Worker (CSW) contacted patient's wife [name] and made her aware. Per wife patient is adamantly refusing SNF and she is okay with taking him home. [facility] chose [facility] for home health and PT is recommending a wheel chair for home. Wife requested for patient to stay in the hospital a few more days before coming home so he can get stronger. CSW made wife aware that MD will make the decision about D/C. RN Case Manager is aware of above. CSW will continue to follow and assist as needed.   Blima Rich, LCSW [phone]

## 2015-12-15 NOTE — NC FL2 (Signed)
[place] LEVEL OF CARE SCREENING TOOL     IDENTIFICATION  Patient Name: Thomas Mcdaniel Birthdate: Feb 28, 1946 Sex: male Admission Date (Current Location): 12/14/2015  [place] and [place] Number:  Engineering geologist and Address:  [facility], [address] [place], [place] [address]      Provider Number: [id number]  Attending Physician Name and Address:  Hillary Bow, MD  Relative Name and Phone Number:       Current Level of Care: Hospital Recommended Level of Care: [place] Prior Approval Number:    Date Approved/Denied:   PASRR Number:  ([id number])  Discharge Plan: SNF    Current Diagnoses: Patient Active Problem List   Diagnosis Date Noted  . Altered mental status 12/14/2015  . Altered mental state 12/14/2015  . 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    Orientation RESPIRATION BLADDER Height & Weight     Self, Time, Situation, Place  Normal Incontinent Weight: 233 lb (105.688 kg) Height:  5\' 9"  (175.3 cm)  BEHAVIORAL SYMPTOMS/MOOD NEUROLOGICAL BOWEL NUTRITION STATUS   (none )  (none ) Incontinent Diet (Diet: Regular )  AMBULATORY STATUS COMMUNICATION OF NEEDS Skin   Extensive Assist Verbally Surgical wounds (12/05/15- Incision Right Knee )                       Personal Care Assistance Level of Assistance  Bathing, Feeding, Dressing Bathing Assistance: Limited assistance Feeding assistance: Independent Dressing Assistance: Limited assistance     Functional Limitations Info  Sight, Hearing, Speech Sight Info: Adequate Hearing Info: Adequate Speech Info: Adequate    SPECIAL CARE FACTORS FREQUENCY  PT (By licensed PT), OT (By licensed OT)     PT Frequency:  (5) OT Frequency:  (5)            Contractures      Additional Factors Info  Code Status, Allergies Code Status Info:  (Full Code. ) Allergies Info:  (Statins)           Current Medications (12/15/2015):  This is the  current hospital active medication list Current Facility-Administered Medications  Medication Dose Route Frequency Provider Last Rate Last Dose  . 0.9 %  sodium chloride infusion  250 mL Intravenous PRN Baxter Hire, MD      . acetaminophen (TYLENOL) tablet 650 mg  650 mg Oral Q6H PRN Baxter Hire, MD       Or  . acetaminophen (TYLENOL) suppository 650 mg  650 mg Rectal Q6H PRN Baxter Hire, MD      . amLODipine (NORVASC) tablet 10 mg  10 mg Oral Daily Baxter Hire, MD   10 mg at 12/14/15 1047  . cefTRIAXone (ROCEPHIN) 1 g in dextrose 5 % 50 mL IVPB  1 g Intravenous Q24H Baxter Hire, MD   1 g at 12/15/15 1138  . clindamycin (CLEOCIN) capsule 300 mg  300 mg Oral TID Baxter Hire, MD   300 mg at 12/15/15 1052  . colchicine tablet 0.6 mg  0.6 mg Oral Daily Baxter Hire, MD   0.6 mg at 12/15/15 1052  . enoxaparin (LOVENOX) injection 40 mg  40 mg Subcutaneous Q24H Baxter Hire, MD   40 mg at 12/15/15 1059  . famotidine (PEPCID) tablet 40 mg  40 mg Oral Daily Baxter Hire, MD   40 mg at 12/15/15 1051  . lisinopril (PRINIVIL,ZESTRIL) tablet 40 mg  40 mg Oral Daily Chrystie Nose  Edwina Barth, MD   40 mg at 12/14/15 1047  . oxyCODONE (Oxy IR/ROXICODONE) immediate release tablet 5-10 mg  5-10 mg Oral Q4H PRN Baxter Hire, MD      . sodium chloride flush (NS) 0.9 % injection 3 mL  3 mL Intravenous Q12H Baxter Hire, MD   3 mL at 12/14/15 2028  . sodium chloride flush (NS) 0.9 % injection 3 mL  3 mL Intravenous PRN Baxter Hire, MD   3 mL at 12/15/15 1053  . vitamin B-12 (CYANOCOBALAMIN) tablet 1,000 mcg  1,000 mcg Oral Daily Baxter Hire, MD   1,000 mcg at 12/15/15 1052     Discharge Medications: Please see discharge summary for a list of discharge medications.  Relevant Imaging Results:  Relevant Lab Results:   Additional Information  (SSN: [id number])  Loralyn Freshwater, LCSW

## 2015-12-15 NOTE — Progress Notes (Signed)
[facility] [place] NAME: Thomas Mcdaniel    MR#:  [id number]  DATE OF BIRTH:  04-Aug-1945  SUBJECTIVE:  CHIEF COMPLAINT:   Chief Complaint  Patient presents with  . Fall   Still has some confusion More awake Afebrile Pain right knee  REVIEW OF SYSTEMS:    Review of Systems  Constitutional: Positive for chills and malaise/fatigue. Negative for fever.  HENT: Negative for sore throat.   Eyes: Negative for blurred vision, double vision and pain.  Respiratory: Negative for cough, hemoptysis, shortness of breath and wheezing.   Cardiovascular: Negative for chest pain, palpitations, orthopnea and leg swelling.  Gastrointestinal: Negative for heartburn, nausea, vomiting, abdominal pain, diarrhea and constipation.  Genitourinary: Negative for dysuria and hematuria.  Musculoskeletal: Positive for back pain and joint pain.  Skin: Negative for rash.  Neurological: Positive for weakness. Negative for sensory change, speech change, focal weakness and headaches.  Endo/Heme/Allergies: Does not bruise/bleed easily.  Psychiatric/Behavioral: Negative for depression. The patient is not nervous/anxious.     DRUG ALLERGIES:   Allergies  Allergen Reactions  . Statins     VITALS:  Blood pressure 113/60, pulse 76, temperature 97.7 F (36.5 C), temperature source Oral, resp. rate 24, height 5\' 9"  (1.753 m), weight 105.688 kg (233 lb), SpO2 97 %.  PHYSICAL EXAMINATION:   Physical Exam  GENERAL:  70 y.o.-year-old patient lying in the bed with no acute distress. Drowsy EYES: Pupils equal, round, reactive to light and accommodation. No scleral icterus. Extraocular muscles intact.  HEENT: Head atraumatic, normocephalic. Oropharynx and nasopharynx clear.  NECK:  Supple, no jugular venous distention. No thyroid enlargement, no tenderness.  LUNGS: Normal breath sounds bilaterally, no wheezing, rales, rhonchi. No use of accessory muscles of  respiration.  CARDIOVASCULAR: S1, S2 normal. No murmurs, rubs, or gallops.  ABDOMEN: Soft, nontender, nondistended. Bowel sounds present. No organomegaly or mass.  EXTREMITIES: No cyanosis, clubbing or edema b/l.   Right knee swollen and warm. NEUROLOGIC: Cranial nerves II through XII are intact. No focal Motor or sensory deficits b/l.   PSYCHIATRIC: The patient is alert and oriented x 3. But drowsy SKIN: No obvious rash, lesion, or ulcer.   LABORATORY PANEL:   CBC  Recent Labs Lab 12/15/15 0722  WBC 20.6*  HGB 10.3*  HCT 29.5*  PLT 267   ------------------------------------------------------------------------------------------------------------------ Chemistries   Recent Labs Lab 12/14/15 0410 12/15/15 0722  NA 137 135  K 4.6 4.2  CL 102 102  CO2 26 26  GLUCOSE 153* 124*  BUN 31* 22*  CREATININE 1.12 0.94  CALCIUM 9.1 8.8*  AST 37  --   ALT 25  --   ALKPHOS 76  --   BILITOT 1.2  --    ------------------------------------------------------------------------------------------------------------------  Cardiac Enzymes No results for input(s): TROPONINI in the last 168 hours. ------------------------------------------------------------------------------------------------------------------  RADIOLOGY:  Dg Knee 2 Views Right  12/14/2015  CLINICAL DATA:  70 year old male with right knee pain. EXAM: RIGHT KNEE - 1-2 VIEW COMPARISON:  Radiograph dated 12/05/2015 FINDINGS: There is a total right knee arthroplasty. There is no acute fracture or dislocation. The bones are mildly osteopenic. There is a small suprapatellar effusion. There is small pocket of retropatellar air. There has been interval removal of the drainage tube. Skin staples noted over the knee. IMPRESSION: Total right knee arthroplasty with a small suprapatellar effusion. interval removal of the drainage catheter. No acute fracture or dislocation. Electronically Signed   By: Laren Everts.D.  On: 12/14/2015  04:11   Ct Head Wo Contrast  12/14/2015  CLINICAL DATA:  70 year old male with altered mental status EXAM: CT HEAD WITHOUT CONTRAST TECHNIQUE: Contiguous axial images were obtained from the base of the skull through the vertex without intravenous contrast. COMPARISON:  None FINDINGS: The ventricles and sulci are appropriate in size for patient's age. Mild periventricular and deep white matter chronic microvascular ischemic changes noted. There is no acute intracranial hemorrhage. No mass effect or midline shift. The visualized paranasal sinuses and mastoid air cells are clear. The calvarium is intact. IMPRESSION: No acute intracranial hemorrhage. 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 If symptoms persist and there are no contraindications, MRI may provide better evaluation if clinically indicated Electronically Signed   By: Anner Crete M.D.   On: 12/14/2015 05:39     ASSESSMENT AND PLAN:   * UTI with acute encephalopathy IV ceftriaxone. Urine cultures have GNRs Afebrile. Elevated WBC. Repeat labs in the morning. Still elevated Blood cultures pending.  * S/P Right Total Knee Replacement: Still has stables in.  Orthopedics on board  * Hypertension Continue home  *  GERD: On PPI.  All the records are reviewed and case discussed with Care Management/Social Workerr. Management plans discussed with the patient, family and they are in agreement.  CODE STATUS: FULL  DVT Prophylaxis: SCDs  TOTAL TIME TAKING CARE OF THIS PATIENT: 35 minutes.   POSSIBLE D/C IN 1-2 DAYS, DEPENDING ON CLINICAL CONDITION.  Hillary Bow R M.D on 12/15/2015 at 3:05 PM  Between 7am to 6pm - Pager - [phone]  After 6pm go to [email or web address] - password [email or web address] [place] [facility]  Office  [phone]  CC: Primary care physician; Valera Castle, MD  Note: This dictation was prepared with Dragon dictation along with smaller phrase technology. Any  transcriptional errors that result from this process are unintentional.

## 2015-12-15 NOTE — Care Management (Signed)
Met with patient and his wife to discuss discharge planning. He is currently receiving Lovenox inj POD 9 today (will need total of 14 doses of '40mg'$ ).  [facility] [phone]. I will phone this in under Dr. Marry Guan if needed at time of discharge. He started crying when asked about rehab needs. He wants to go home. He wants [facility]. He has a rolling walker and bedside commode at home per wife. She agrees to take him home. He can ride in a car. Referral called to [facility]. RNCM will continue to follow.

## 2015-12-15 NOTE — Evaluation (Signed)
Physical Therapy Evaluation Patient Details Name: EPIGMENIO KOLTON MRN: [id number] DOB: 1946-04-03 Today's Date: 12/15/2015   History of Present Illness  Wavie Pinsker is a 70yo white male who comes to [facility] after AMS and a fall at home onto a sub-acute TKA, found to have UTI and now gout. The patient returned on POD8, from home, although he was originally DC to [facility] for STR, but self DC AMA. The pt reports he was very confused and with AMS most of his stay at [facility]. At baseline the patient was with R  foot drop x3Y, for which the patient was unable to use an AFO successfully. He reports only walking limited household distances prior to his TKA. The patient AMB 266ft around POD1, and then was unable to AMB any additional trials for unclear reasons. TOday he presents with Left foot pain which he attributes to gout.     Clinical Impression  Upon entry, the patient is received semirecumbent in bed, with granddaughter and sitter present. The pt is awake and agreeable to participate. No acute distress noted at this time. The pt is alert and oriented x3, pleasant, conversational, and following simple and multi-step commands consistently. Strength/MMT screening reveals focal impairment in the following areas: R ankle dorsiflexors, R knee extensors, R hip abductors, R hip adductors, R hip flexors. Functional mobility assessment demonstrates moderate to severe weakness, the pt now requiring Mod-max-assist physical assistance for bed mobility and transfers, and unable to trial gait due to safety concerns and poor standing tolerance. These impairments demonstrate a decline since admission last week. Function is also limited due to L foot pain related to suspected acute gout flare. Patient presenting with impairment of strength, range of motion, balance, and activity tolerance, limiting ability to perform ADL and mobility tasks at  baseline level of function. Patient will benefit from skilled intervention to  address the above impairments and limitations, in order to restore to prior level of function, improve patient safety upon discharge, and to decrease falls risk.       Follow Up Recommendations SNF    Equipment Recommendations  None recommended by PT (If pt declines STR, will need a WC at home. )    Recommendations for Other Services       Precautions / Restrictions Precautions Precautions: Fall;Knee Precaution Booklet Issued: No Restrictions Weight Bearing Restrictions: Yes RLE Weight Bearing: Weight bearing as tolerated      Mobility  Bed Mobility Overal bed mobility: Needs Assistance Bed Mobility: Supine to Sit     Supine to sit: Min assist     General bed mobility comments: heavy verbal and tactile cues to teach leg hook adn log roll, mobility without trapeze.   Transfers Overall transfer level: Needs assistance Equipment used: Rolling walker (2 wheeled) Transfers: Sit to/from Stand Sit to Stand: Min assist;From elevated surface         General transfer comment: Requires near complete use of BUE, with limited BLE contributions, due to RLE weakness, and LLE gout pain. Once legs are beneath his body, tolerates standing for only ~30 seconds.   Ambulation/Gait Ambulation/Gait assistance:  (unable due to safety. )              Stairs            Wheelchair Mobility    Modified Rankin (Stroke Patients Only)       Balance Overall balance assessment: Needs assistance;History of Falls  Pertinent Vitals/Pain Pain Assessment:  (intermittents R knee pain with end-range fleixon, and Left foot pain with weight bearing. ) Pain Intervention(s): Monitored during session    Home Living Family/patient expects to be discharged to:: Private residence Living Arrangements: Spouse/significant other Available Help at Discharge: Family Type of Home: House Home Access: Level entry     Home Layout:  One level Home Equipment: Environmental consultant - 2 wheels;Cane - single point;Bedside commode;Grab bars - tub/shower (3 wheeled scooter with a seat. )      Prior Function Level of Independence: Independent with assistive device(s)         Comments: Pt reports use of single point cane for limited community mobility. Infrequent use of rolling walker     Hand Dominance   Dominant Hand: Right    Extremity/Trunk Assessment                 RLE Deficits / Details: on 6/12 patient was able to perform SAQ independently and 2-finger assist for SLR on RLE; today he is able to perform only 6 SAQ, and requires mod-max physical assist to perform SLR on RLE. RLE foot drop appears worse as well, most range mediated by digital extensors.        Communication   Communication: No difficulties  Cognition Arousal/Alertness: Awake/alert Behavior During Therapy: WFL for tasks assessed/performed Overall Cognitive Status: Within Functional Limits for tasks assessed                      General Comments      Exercises Total Joint Exercises Short Arc Quad: Right;10 reps;Supine;AAROM;Strengthening Heel Slides: Strengthening;Right;10 reps;Supine;AAROM Hip ABduction/ADduction: Right;10 reps;Supine;AAROM Straight Leg Raises: AAROM;Right;10 reps;Supine Goniometric ROM: 8-98 degrees R knee flexion       Assessment/Plan    PT Assessment Patient needs continued PT services  PT Diagnosis Difficulty walking;Abnormality of gait;Generalized weakness   PT Problem List Decreased strength;Decreased range of motion;Decreased activity tolerance;Decreased balance;Decreased mobility;Pain;Impaired sensation;Decreased safety awareness  PT Treatment Interventions DME instruction;Gait training;Stair training;Therapeutic exercise;Therapeutic activities;Balance training;Cognitive remediation;Patient/family education;Manual techniques;Functional mobility training   PT Goals (Current goals can be found in the Care  Plan section) Acute Rehab PT Goals Patient Stated Goal: Regain Independence PT Goal Formulation: With patient Time For Goal Achievement: 12/29/15 Potential to Achieve Goals: Fair    Frequency 7X/week   Barriers to discharge Decreased caregiver support      Co-evaluation               End of Session Equipment Utilized During Treatment: Gait belt Activity Tolerance: Patient tolerated treatment well;Patient limited by fatigue;Patient limited by pain Patient left: in bed;with call bell/phone within reach;with nursing/sitter in room           Time: 1351-1414 PT Time Calculation (min) (ACUTE ONLY): 23 min   Charges:   PT Evaluation $PT Eval High Complexity: 1 Procedure PT Treatments $Therapeutic Exercise: 8-22 mins   PT G Codes:       2:44 PM, January 11, 2016 Etta Grandchild, PT, DPT PRN Physical Therapist - [place] License # [id number] [phone] (mobile)

## 2015-12-16 LAB — BASIC METABOLIC PANEL
Anion gap: 5 (ref 5–15)
BUN: 23 mg/dL — ABNORMAL HIGH (ref 6–20)
CALCIUM: 9 mg/dL (ref 8.9–10.3)
CHLORIDE: 103 mmol/L (ref 101–111)
CO2: 28 mmol/L (ref 22–32)
CREATININE: 0.97 mg/dL (ref 0.61–1.24)
GFR calc non Af Amer: >60 mL/min (ref >60)
Glucose, Bld: 112 mg/dL — ABNORMAL HIGH (ref 65–99)
Potassium: 4.2 mmol/L (ref 3.5–5.1)
SODIUM: 136 mmol/L (ref 135–145)

## 2015-12-16 LAB — URINE CULTURE
Culture: >=100000 — AB
Special Requests: NORMAL

## 2015-12-16 LAB — CBC WITH DIFFERENTIAL/PLATELET
Basophils Absolute: 0 10*3/uL (ref 0–0.1)
EOS ABS: 0.3 10*3/uL (ref 0–0.7)
Eosinophils Relative: 2 %
HCT: 30.5 % — ABNORMAL LOW (ref 40.0–52.0)
HEMOGLOBIN: 10.6 g/dL — AB (ref 13.0–18.0)
Lymphocytes Relative: 13 %
Lymphs Abs: 1.6 10*3/uL (ref 1.0–3.6)
MCH: 31.4 pg (ref 26.0–34.0)
MCHC: 34.7 g/dL (ref 32.0–36.0)
MCV: 90.5 fL (ref 80.0–100.0)
Monocytes Absolute: 1 10*3/uL (ref 0.2–1.0)
Neutro Abs: 9.8 10*3/uL — ABNORMAL HIGH (ref 1.4–6.5)
Platelets: 290 10*3/uL (ref 150–440)
RBC: 3.37 MIL/uL — ABNORMAL LOW (ref 4.40–5.90)
RDW: 13.4 % (ref 11.5–14.5)
WBC: 12.8 10*3/uL — ABNORMAL HIGH (ref 3.8–10.6)

## 2015-12-16 MED ORDER — OXYCODONE HCL 5 MG PO TABS: 5.0000 mg | ORAL_TABLET | Freq: Four times a day (QID) | ORAL | Status: DC | PRN

## 2015-12-16 MED ORDER — CIPROFLOXACIN HCL 500 MG PO TABS: 500.0000 mg | ORAL_TABLET | Freq: Two times a day (BID) | ORAL | Status: DC

## 2015-12-16 MED ORDER — ASPIRIN EC 325 MG PO TBEC: 325.0000 mg | DELAYED_RELEASE_TABLET | Freq: Every day | ORAL | Status: DC

## 2015-12-16 NOTE — Care Management (Signed)
   Patient suffers from right total knee replacement which impairs their ability to perform daily   activities like toileting, feeding, dressing, grooming, bathing in the home. A cane, walker, crutch will not resolve  issue with performing activities of daily living. A wheelchair will allow patient to safely perform daily activities.  Patient can safely propel the wheelchair in the home or has a caregiver who can provide assistance.

## 2015-12-16 NOTE — Care Management Important Message (Signed)
Important Message  Patient Details  Name: Thomas Mcdaniel MRN: [id number] Date of Birth: [birth year]   Medicare Important Message Given:  Yes    [name] 12/16/2015, 10:45 AM

## 2015-12-16 NOTE — Care Management (Addendum)
Wheelchair requested from patient's wife; [facility]. Patient's mental status much improved; he insists on returning home. His wife agrees. I have requested social worker to be added to home health order. I have notified [facility] of this need and patient's discharge. I have paged Dr. Marry Guan regarding Lovenox order at discharge (looks like he need 4 more doses at home). I spoke with Dr. Marry Guan and he wants patient on Aspirin 325 mg daily for this patient- not Lovenox. I have text Dr. Darvin Neighbours to add to discharge order. Sitter list provided to wife. He has not had any alcohol in 25 years per wife. Follow up appointment June 27 with Dr. Theressa Millard PA. Wheelchair will be delivered prior to discharge today. No further RNCM needs.

## 2015-12-16 NOTE — Progress Notes (Signed)
Physical Therapy Treatment Patient Details Name: Thomas Mcdaniel MRN: [id number] DOB: [birth year] Today's Date: 12/16/2015    History of Present Illness Thomas Mcdaniel is a 70yo white male who comes to [facility] after AMS and a fall at home onto a sub-acute TKA, found to have UTI and now gout. The patient returned on POD8, from home, although he was originally DC to [facility] for STR, but self DC AMA. The pt reports he was very confused and with AMS most of his stay at [facility]. At baseline the patient was with R  foot drop x3Y, for which the patient was unable to use an AFO successfully. He reports only walking limited household distances prior to his TKA. The patient AMB 276ft around POD1, and then was unable to AMB any additional trials for unclear reasons. TOday he presents with Left foot pain which he attributes to gout.     PT Comments    Pt tolerating treatment session well, motivated and able to complete entire PT sesssion as planned, but somewhat distracted requiring VC to attend to task for therex. Pt making minimal progress toward goals as evidenced by 25% improvement in quads activation on R and pain/weight bearing tolerance on L foot. Pt's greatest limitation continues to be RLE weakness and instability which continues to limit ability to perform transfers/ambulation at baseline function. Patient presenting with impairment of strength, pain, range of motion, balance, and activity tolerance, limiting ability to perform ADL and mobility tasks at  baseline level of function. Patient will benefit from skilled intervention to address the above impairments and limitations, in order to restore to prior level of function, improve patient safety upon discharge, and to decrease caregiver burden. *Discussed DC recommendation for STR again with patient who remains resistant to DC anywhere other than home.      Follow Up Recommendations  SNF (Pt is not agreeable. )     Equipment Recommendations  None  recommended by PT    Recommendations for Other Services       Precautions / Restrictions Precautions Precautions: Fall;Knee Precaution Booklet Issued: No Restrictions RLE Weight Bearing: Weight bearing as tolerated    Mobility  Bed Mobility Overal bed mobility: Needs Assistance Bed Mobility: Supine to Sit     Supine to sit: Supervision     General bed mobility comments: Performed with greater ease today, but using the trapeze.   Transfers Overall transfer level: Needs assistance Equipment used: Rolling walker (2 wheeled) Transfers: Sit to/from Stand Sit to Stand: Min assist;From elevated surface         General transfer comment: Performed 3x today consecutively, with improved pain response in the LLE, improved ability to maintian upright posture and stabilize on RLE.   Ambulation/Gait Ambulation/Gait assistance: Min guard Ambulation Distance (Feet): 10 Feet Assistive device: Rolling walker (2 wheeled)     Gait velocity interpretation: <1.8 ft/sec, indicative of risk for recurrent falls General Gait Details: very small, guarded steps, with heavy weightbearing on BUE.    Stairs            Wheelchair Mobility    Modified Rankin (Stroke Patients Only)       Balance Overall balance assessment: Needs assistance;History of Falls         Standing balance support: During functional activity;Bilateral upper extremity supported Standing balance-Leahy Scale: Fair                      Cognition Arousal/Alertness: Awake/alert Behavior During Therapy: WFL for tasks  assessed/performed Overall Cognitive Status: Within Functional Limits for tasks assessed                      Exercises Total Joint Exercises Ankle Circles/Pumps: AROM;Left;20 reps (assistance for R due to chronic foot drop.; ) Quad Sets: Strengthening;Right;Supine;15 reps Short Arc Quad: Right;Supine;Strengthening;15 reps (strength inproved since yeseterday, with c continued  quads lag in last 10-15 degrees. ) Heel Slides: Strengthening;Right;Supine;15 reps (self assist with sheet. ) Straight Leg Raises: AAROM;Right;10 reps;Supine (still very weak, limited capacity to perform with requisite quality. ) Goniometric ROM: R knee flexion 11-88 degrees (seated today)  [place];Both;10 reps;Supine    General Comments        Pertinent Vitals/Pain Pain Assessment: 0-10 Pain Score: 4  Pain Location: Left foot, Right knee  Pain Descriptors / Indicators: Aching Pain Intervention(s): Monitored during session    Home Living                      Prior Function            PT Goals (current goals can now be found in the care plan section) Acute Rehab PT Goals Patient Stated Goal: Regain Independence PT Goal Formulation: With patient Time For Goal Achievement: 12/29/15 Potential to Achieve Goals: Fair Progress towards PT goals: Progressing toward goals    Frequency  7X/week    PT Plan Current plan remains appropriate    Co-evaluation             End of Session Equipment Utilized During Treatment: Gait belt Activity Tolerance: Patient tolerated treatment well;Patient limited by fatigue;Patient limited by pain Patient left: with call bell/phone within reach;in chair;with chair alarm set;with nursing/sitter in room     Time: 0924-0949 PT Time Calculation (min) (ACUTE ONLY): 25 min  Charges:  $Gait Training: 8-22 mins $Therapeutic Exercise: 8-22 mins                    G Codes:      10:07 AM, 02-Jan-2016 Etta Grandchild, PT, DPT PRN Physical Therapist - [place] License # [id number] ([phone] (mobile)

## 2015-12-16 NOTE — Discharge Instructions (Signed)
°  DIET:  °Cardiac diet ° °DISCHARGE CONDITION:  °Stable ° °ACTIVITY:  °Activity as tolerated ° °OXYGEN:  °Home Oxygen: No. °  °Oxygen Delivery: room air ° °DISCHARGE LOCATION:  °Home with home health  ° °If you experience worsening of your admission symptoms, develop shortness of breath, life threatening emergency, suicidal or homicidal thoughts you must seek medical attention immediately by calling 911 or calling your MD immediately  if symptoms less severe. ° °You Must read complete instructions/literature along with all the possible adverse reactions/side effects for all the Medicines you take and that have been prescribed to you. Take any new Medicines after you have completely understood and accpet all the possible adverse reactions/side effects.  ° °Please note ° °You were cared for by a hospitalist during your hospital stay. If you have any questions about your discharge medications or the care you received while you were in the hospital after you are discharged, you can call the unit and asked to speak with the hospitalist on call if the hospitalist that took care of you is not available. Once you are discharged, your primary care physician will handle any further medical issues. Please note that NO REFILLS for any discharge medications will be authorized once you are discharged, as it is imperative that you return to your primary care physician (or establish a relationship with a primary care physician if you do not have one) for your aftercare needs so that they can reassess your need for medications and monitor your lab values. ° ° °

## 2015-12-19 LAB — CULTURE, BLOOD (ROUTINE X 2)
CULTURE: NO GROWTH
Culture: NO GROWTH

## 2015-12-20 NOTE — Discharge Summary (Signed)
[facility] NAME: Thomas Mcdaniel    MR#:  [id number]  DATE OF BIRTH:  Sep 13, 1945  DATE OF ADMISSION:  12/14/2015 ADMITTING PHYSICIAN: Baxter Hire, MD  DATE OF DISCHARGE: 12/16/2015  1:10 PM  PRIMARY CARE PHYSICIAN: Valera Castle, MD   ADMISSION DIAGNOSIS:  Urinary tract infection associated with catheterization of urinary tract, initial encounter [T83.51XA, N39.0] Altered mental status, unspecified altered mental status type [R41.82]  DISCHARGE DIAGNOSIS:  Active Problems:   Altered mental status   Altered mental state   SECONDARY DIAGNOSIS:   Past Medical History  Diagnosis Date  . BPH (benign prostatic hyperplasia)   . DJD (degenerative joint disease)   . GERD (gastroesophageal reflux disease)   . Gout   . Hard of hearing   . Hemorrhoids   . Hyperlipidemia   . Hypertension   . Obesity   . Scoliosis   . Lumbar spinal stenosis   . Glucose intolerance (impaired glucose tolerance)   . Nephrolithiasis   . Personal history of PE (pulmonary embolism)   . Complication of anesthesia     difficult to wake up after a lithotripsy  . PONV (postoperative nausea and vomiting)     after cataract surgery     ADMITTING HISTORY  HPI: Recent right knee replacement. Left rehab AMA. Has been confused and occasionally combative. Found to have UTI in ED. Patient complained of fever for past few days. Also said he feel earlier today. Complains of left ankle pain.  HOSPITAL COURSE:   * Klebsiella UTI with acute encephalopathy IV ceftriaxone in the hospital. Once cultures returned patient is being transitioned to oral ciprofloxacin as outpatient. Afebrile. Elevated WBC. Repeat labs in the morning with WBC close to normal Blood cultures negative. Patient is alert and oriented 3 on day of discharge  * S/P Right Total Knee Replacement:  Orthopedics on board Follow-up with Dr. Marry Guan as outpatient  *  Hypertension Continue home  * GERD: On PPI.  Patient was seen by physical therapy and was thought to be a candidate for skilled nursing facility. Patient has refused going to rehabilitation and wanted to be discharged home. Discussed with wife over phone in detail and answered all questions. Prescriptions for pain medications and antibiotics given. Patient is being discharged home with home health services.  Follow-up with PCP and orthopedics in 1 week.  CONSULTS OBTAINED:     DRUG ALLERGIES:   Allergies  Allergen Reactions  . Statins     DISCHARGE MEDICATIONS:   Discharge Medication List as of 12/16/2015 12:04 PM    START taking these medications   Details  aspirin EC 325 MG tablet Take 1 tablet (325 mg total) by mouth daily., Starting 12/16/2015, Until Discontinued, Normal    ciprofloxacin (CIPRO) 500 MG tablet Take 1 tablet (500 mg total) by mouth 2 (two) times daily., Starting 12/16/2015, Until Discontinued, Normal      CONTINUE these medications which have CHANGED   Details  oxyCODONE (OXY IR/ROXICODONE) 5 MG immediate release tablet Take 1 tablet (5 mg total) by mouth every 6 (six) hours as needed for severe pain or breakthrough pain., Starting 12/16/2015, Until Discontinued, Print      CONTINUE these medications which have NOT CHANGED   Details  amLODipine (NORVASC) 10 MG tablet Take 10 mg by mouth daily., Until Discontinued, Historical Med    clindamycin (CLEOCIN) 300 MG capsule Take 300 mg by mouth 3 (three) times daily., Until Discontinued, Historical  Med    colchicine 0.6 MG tablet Take 0.6 mg by mouth 2 (two) times daily as needed. , Until Discontinued, Historical Med    enoxaparin (LOVENOX) 40 MG/0.4ML injection Inject 0.4 mLs (40 mg total) into the skin daily., Starting 12/06/2015, Until Discontinued, No Print    famotidine (PEPCID) 40 MG tablet Take 40 mg by mouth daily as needed. , Until Discontinued, Historical Med    lisinopril (PRINIVIL,ZESTRIL) 40 MG  tablet Take 40 mg by mouth daily., Until Discontinued, Historical Med    traMADol (ULTRAM) 50 MG tablet Take 1-2 tablets (50-100 mg total) by mouth every 4 (four) hours as needed for moderate pain., Starting 12/06/2015, Until Discontinued, Print    vitamin B-12 (CYANOCOBALAMIN) 1000 MCG tablet Take 1,000 mcg by mouth daily., Until Discontinued, Historical Med        Today   VITAL SIGNS:  Blood pressure 118/69, pulse 66, temperature 97.6 F (36.4 C), temperature source Oral, resp. rate 16, height 5\' 9"  (1.753 m), weight 105.688 kg (233 lb), SpO2 97 %.  I/O:  No intake or output data in the 24 hours ending 12/20/15 1029  PHYSICAL EXAMINATION:  Physical Exam  GENERAL:  70 y.o.-year-old patient lying in the bed with no acute distress.  LUNGS: Normal breath sounds bilaterally, no wheezing, rales,rhonchi or crepitation. No use of accessory muscles of respiration.  CARDIOVASCULAR: S1, S2 normal. No murmurs, rubs, or gallops.  ABDOMEN: Soft, non-tender, non-distended. Bowel sounds present. No organomegaly or mass.  NEUROLOGIC: Moves all 4 extremities. PSYCHIATRIC: The patient is alert and oriented x 3.  SKIN: No obvious rash, lesion, or ulcer.   Right knee swollen and warm. Ice wrap around it.  DATA REVIEW:   CBC  Recent Labs Lab 12/16/15 0509  WBC 12.8*  HGB 10.6*  HCT 30.5*  PLT 290    Chemistries   Recent Labs Lab 12/14/15 0410  12/16/15 0509  NA 137  < > 136  K 4.6  < > 4.2  CL 102  < > 103  CO2 26  < > 28  GLUCOSE 153*  < > 112*  BUN 31*  < > 23*  CREATININE 1.12  < > 0.97  CALCIUM 9.1  < > 9.0  AST 37  --   --   ALT 25  --   --   ALKPHOS 76  --   --   BILITOT 1.2  --   --   < > = values in this interval not displayed.  Cardiac Enzymes No results for input(s): TROPONINI in the last 168 hours.  Microbiology Results  Results for orders placed or performed during the hospital encounter of 12/14/15  Urine culture     Status: Abnormal   Collection Time:  12/14/15  5:00 AM  Result Value Ref Range Status   Specimen Description URINE, RANDOM  Final   Special Requests Normal  Final   Culture >=100,000 COLONIES/mL KLEBSIELLA OXYTOCA (A)  Final   Report Status 12/16/2015 FINAL  Final   Organism ID, Bacteria KLEBSIELLA OXYTOCA (A)  Final      Susceptibility   Klebsiella oxytoca - MIC*    AMPICILLIN >=32 RESISTANT Resistant     CEFAZOLIN 16 SENSITIVE Sensitive     CEFTRIAXONE <=1 SENSITIVE Sensitive     CIPROFLOXACIN <=0.25 SENSITIVE Sensitive     GENTAMICIN <=1 SENSITIVE Sensitive     IMIPENEM <=0.25 SENSITIVE Sensitive     NITROFURANTOIN 64 INTERMEDIATE Intermediate     TRIMETH/SULFA <=20 SENSITIVE Sensitive  AMPICILLIN/SULBACTAM 16 INTERMEDIATE Intermediate     PIP/TAZO <=4 SENSITIVE Sensitive     Extended ESBL NEGATIVE Sensitive     * >=100,000 COLONIES/mL KLEBSIELLA OXYTOCA  Blood culture (routine x 2)     Status: None   Collection Time: 12/14/15  5:54 AM  Result Value Ref Range Status   Specimen Description BLOOD LEFT HAND  Final   Special Requests   Final    BOTTLES DRAWN AEROBIC AND ANAEROBIC AER 8ML ANA 7ML   Culture NO GROWTH 5 DAYS  Final   Report Status 12/19/2015 FINAL  Final  Blood culture (routine x 2)     Status: None   Collection Time: 12/14/15  5:54 AM  Result Value Ref Range Status   Specimen Description BLOOD RIGHT A  Final   Special Requests   Final    BOTTLES DRAWN AEROBIC AND ANAEROBIC AER 9ML ANA 6ML   Culture NO GROWTH 5 DAYS  Final   Report Status 12/19/2015 FINAL  Final    RADIOLOGY:  No results found.  Follow up with PCP in 1 week.  Management plans discussed with the patient, family and they are in agreement.  CODE STATUS:  Code Status History    Date Active Date Inactive Code Status Order ID Comments User Context   12/14/2015  8:49 AM 12/16/2015  7:48 PM Full Code [id number]  Baxter Hire, MD Inpatient      TOTAL TIME TAKING CARE OF THIS PATIENT ON DAY OF DISCHARGE: more than 30 minutes.    Hillary Bow R M.D on 12/20/2015 at 10:29 AM  Between 7am to 6pm - Pager - [phone]  After 6pm go to [email or web address] - password [email or web address]  Office  [phone]  CC: Primary care physician; Valera Castle, MD  Note: This dictation was prepared with Dragon dictation along with smaller phrase technology. Any transcriptional errors that result from this process are unintentional.

## 2016-04-05 ENCOUNTER — Other Ambulatory Visit: Payer: Self-pay | Admitting: Medical Oncology

## 2016-04-06 ENCOUNTER — Encounter (INDEPENDENT_AMBULATORY_CARE_PROVIDER_SITE_OTHER): Payer: Self-pay

## 2016-04-06 ENCOUNTER — Ambulatory Visit: Payer: Medicare Other

## 2016-04-06 ENCOUNTER — Other Ambulatory Visit (INDEPENDENT_AMBULATORY_CARE_PROVIDER_SITE_OTHER): Payer: Self-pay | Admitting: Medical Oncology

## 2016-04-06 ENCOUNTER — Other Ambulatory Visit: Payer: Self-pay | Admitting: Medical Oncology

## 2016-04-06 ENCOUNTER — Ambulatory Visit (INDEPENDENT_AMBULATORY_CARE_PROVIDER_SITE_OTHER): Payer: Medicare Other

## 2016-04-06 DIAGNOSIS — R600 Localized edema: Secondary | ICD-10-CM

## 2016-04-06 DIAGNOSIS — M7989 Other specified soft tissue disorders: Secondary | ICD-10-CM | POA: Diagnosis not present

## 2016-04-06 DIAGNOSIS — M79604 Pain in right leg: Secondary | ICD-10-CM | POA: Diagnosis not present

## 2016-04-12 ENCOUNTER — Other Ambulatory Visit: Payer: Self-pay

## 2016-04-12 DIAGNOSIS — N400 Enlarged prostate without lower urinary tract symptoms: Secondary | ICD-10-CM

## 2016-04-13 ENCOUNTER — Encounter: Payer: Self-pay | Admitting: Urology

## 2016-04-13 ENCOUNTER — Other Ambulatory Visit
Admission: RE | Admit: 2016-04-13 | Discharge: 2016-04-13 | Disposition: A | Discharge status: Home / Self Care | Payer: Medicare Other | Source: Ambulatory Visit | Attending: Urology | Admitting: Urology

## 2016-04-13 ENCOUNTER — Ambulatory Visit (INDEPENDENT_AMBULATORY_CARE_PROVIDER_SITE_OTHER): Payer: Medicare Other | Admitting: Urology

## 2016-04-13 VITALS — BP 150/84 | HR 73 | Ht 69.0 in | Wt 258.0 lb

## 2016-04-13 DIAGNOSIS — N481 Balanitis: Secondary | ICD-10-CM | POA: Diagnosis not present

## 2016-04-13 DIAGNOSIS — R351 Nocturia: Secondary | ICD-10-CM | POA: Diagnosis not present

## 2016-04-13 DIAGNOSIS — N400 Enlarged prostate without lower urinary tract symptoms: Secondary | ICD-10-CM

## 2016-04-13 DIAGNOSIS — N401 Enlarged prostate with lower urinary tract symptoms: Secondary | ICD-10-CM | POA: Diagnosis not present

## 2016-04-13 DIAGNOSIS — N138 Other obstructive and reflux uropathy: Secondary | ICD-10-CM

## 2016-04-13 DIAGNOSIS — N281 Cyst of kidney, acquired: Secondary | ICD-10-CM | POA: Diagnosis not present

## 2016-04-13 DIAGNOSIS — R350 Frequency of micturition: Secondary | ICD-10-CM

## 2016-04-13 LAB — BLADDER SCAN AMB NON-IMAGING: SCAN RESULT: 0

## 2016-04-13 LAB — PSA: PSA: 0.4 ng/mL (ref 0.00–4.00)

## 2016-04-13 NOTE — Progress Notes (Signed)
04/13/2016 11:05 AM   Thomas Mcdaniel Feb 07, 1946 [id number]  Referring provider: Valera Castle, MD [address] [place], [place] [address]  Chief Complaint  Patient presents with  . Benign Prostatic Hypertrophy    new patient  . Urinary Frequency    HPI: Patient is a 70 year old Caucasian male who is referred by his primary care physician, Dr. Kym Groom,  for BPH with LUTS and urinary frequency.  Patient states that during the day he is visiting the restroom to urinate every 2 hours. He is getting up 3-6 times nightly to urinate.  He is also suffering with urge incontinence and postvoid dribbling.  This has been occurring for over one year.    He states he was seen at [facility] where they ran a bunch of tests and gave him some pills that helps with the urinary symptoms.  The medication was tamsulosin.    He consumes no water throughout the day and does not consume a lot of fluids.  He is also a smoker.  He takes lisinopril.  He is also diabetic and his most recent HBGA1C was 6.5% on 04/05/2016.  He did have a sleep study over one year ago and was diagnosed with mild sleep apnea. He has had weight gain since that sleep study was performed.  His IPSS score today is 13, which is moderate lower urinary tract symptomatology. He is mixed with his quality life due to his urinary symptoms. His PVR is 0 mL.  He denies any dysuria, hematuria or suprapubic pain.   He is currently taking tamsulosin and finasteride.  He also denies any recent fevers, chills, nausea or vomiting.  He does not have a family history of PCa.      IPSS    Row Name 04/13/16 1000         International Prostate Symptom Score   How often have you had the sensation of not emptying your bladder? Less than 1 in 5     How often have you had to urinate less than every two hours? Less than half the time     How often have you found you stopped and started again several times when you urinated? Not at All     How often  have you found it difficult to postpone urination? Almost always     How often have you had a weak urinary stream? Not at All     How often have you had to strain to start urination? Not at All     How many times did you typically get up at night to urinate? 5 Times     Total IPSS Score 13       Quality of Life due to urinary symptoms   If you were to spend the rest of your life with your urinary condition just the way it is now how would you feel about that? Mixed        Score:  1-7 Mild 8-19 Moderate 20-35 Severe    PMH: Past Medical History:  Diagnosis Date  . BPH (benign prostatic hyperplasia)   . Complication of anesthesia    difficult to wake up after a lithotripsy  . DJD (degenerative joint disease)   . GERD (gastroesophageal reflux disease)   . Glucose intolerance (impaired glucose tolerance)   . Gout   . Hard of hearing   . Hemorrhoids   . Hyperlipidemia   . Hypertension   . Lumbar spinal stenosis   . Nephrolithiasis   .  Obesity   . Personal history of PE (pulmonary embolism)   . PONV (postoperative nausea and vomiting)    after cataract surgery  . Scoliosis     Surgical History: Past Surgical History:  Procedure Laterality Date  . CATARACT EXTRACTION    . COLONOSCOPY WITH PROPOFOL N/A 08/01/2015   Procedure: COLONOSCOPY WITH PROPOFOL;  Surgeon: Josefine Class, MD;  Location: [facility] ENDOSCOPY;  Service: Endoscopy;  Laterality: N/A;  . FEMORAL ARTERY REPAIR    . HERNIA REPAIR    . KNEE ARTHROPLASTY Right 12/05/2015   Procedure: COMPUTER ASSISTED TOTAL KNEE ARTHROPLASTY;  Surgeon: Dereck Leep, MD;  Location: [facility] ORS;  Service: Orthopedics;  Laterality: Right;  . knee arthroscopy    . LAMINECTOMY    . LITHOTRIPSY    . LITHOTRIPSY    . SPINAL FUSION    . TOE AMPUTATION    . TONSILLECTOMY      Home Medications:    Medication List       Accurate as of 04/13/16 11:05 AM. Always use your most recent med list.          amLODipine 10 MG  tablet Commonly known as:  NORVASC Take 10 mg by mouth daily.   aspirin EC 325 MG tablet Take 1 tablet (325 mg total) by mouth daily.   ciprofloxacin 500 MG tablet Commonly known as:  CIPRO Take 1 tablet (500 mg total) by mouth 2 (two) times daily.   clindamycin 300 MG capsule Commonly known as:  CLEOCIN Take 300 mg by mouth 3 (three) times daily.   colchicine 0.6 MG tablet Take 0.6 mg by mouth 2 (two) times daily as needed.   enoxaparin 40 MG/0.4ML injection Commonly known as:  LOVENOX Inject 0.4 mLs (40 mg total) into the skin daily.   famotidine 40 MG tablet Commonly known as:  PEPCID Take 40 mg by mouth daily as needed.   finasteride 5 MG tablet Commonly known as:  PROSCAR Take by mouth.   lisinopril 40 MG tablet Commonly known as:  PRINIVIL,ZESTRIL Take 40 mg by mouth daily.   metFORMIN 500 MG 24 hr tablet Commonly known as:  GLUCOPHAGE-XR Take by mouth.   omeprazole 40 MG capsule Commonly known as:  PRILOSEC Take by mouth.   oxyCODONE 5 MG immediate release tablet Commonly known as:  Oxy IR/ROXICODONE Take 1 tablet (5 mg total) by mouth every 6 (six) hours as needed for severe pain or breakthrough pain.   pravastatin 10 MG tablet Commonly known as:  PRAVACHOL Take by mouth.   tamsulosin 0.4 MG Caps capsule Commonly known as:  FLOMAX Take by mouth.   traMADol 50 MG tablet Commonly known as:  ULTRAM Take 1-2 tablets (50-100 mg total) by mouth every 4 (four) hours as needed for moderate pain.   vitamin B-12 1000 MCG tablet Commonly known as:  CYANOCOBALAMIN Take 1,000 mcg by mouth daily.       Allergies:  Allergies  Allergen Reactions  . Statins     Family History: Family History  Problem Relation Age of Onset  . Prostate cancer Neg Hx   . Kidney disease Neg Hx     Social History:  reports that he quit smoking about 23 years ago. He has never used smokeless tobacco. He reports that he does not drink alcohol or use  drugs.  ROS: UROLOGY Frequent Urination?: Yes Hard to postpone urination?: No Burning/pain with urination?: No Get up at night to urinate?: Yes Leakage of urine?: No Urine stream  starts and stops?: No Trouble starting stream?: No Do you have to strain to urinate?: No Blood in urine?: No Urinary tract infection?: No Sexually transmitted disease?: No Injury to kidneys or bladder?: No Painful intercourse?: No Weak stream?: No Erection problems?: Yes Penile pain?: No  Gastrointestinal Nausea?: No Vomiting?: No Indigestion/heartburn?: No Diarrhea?: Yes Constipation?: Yes  Constitutional Fever: No Night sweats?: No Weight loss?: No Fatigue?: Yes  Skin Skin rash/lesions?: No Itching?: No  Eyes Blurred vision?: Yes Double vision?: No  Ears/Nose/Throat Sore throat?: No Sinus problems?: No  Hematologic/Lymphatic Swollen glands?: No Easy bruising?: No  Cardiovascular Leg swelling?: Yes Chest pain?: No  Respiratory Cough?: No Shortness of breath?: No  Endocrine Excessive thirst?: No  Musculoskeletal Back pain?: Yes Joint pain?: Yes  Neurological Headaches?: No Dizziness?: No  Psychologic Depression?: No Anxiety?: No  Physical Exam: BP (!) 150/84   Pulse 73   Ht 5\' 9"  (1.753 m)   Wt 258 lb (117 kg)   BMI 38.10 kg/m   Constitutional: Well nourished. Alert and oriented, No acute distress. HEENT: [place] AT, moist mucus membranes. Trachea midline, no masses. Cardiovascular: No clubbing, cyanosis, or edema. Respiratory: Normal respiratory effort, no increased work of breathing. GI: Abdomen is soft, non tender, non distended, no abdominal masses. Liver and spleen not palpable.  No hernias appreciated.  Stool sample for occult testing is not indicated.   GU: No CVA tenderness.  No bladder fullness or masses.  Patient with uncircumcised phallus. Foreskin easily retracted  Urethral meatus is patent.  No penile discharge. Balanitis.   Scrotum without  lesions, cysts, rashes and/or edema.  Testicles are located scrotally bilaterally. No masses are appreciated in the testicles. Left and right epididymis are normal. Rectal: Patient with  normal sphincter tone. Anus and perineum without scarring or rashes. No rectal masses are appreciated. Prostate is approximately 55 grams, no nodules are appreciated. Seminal vesicles are normal. Skin: No rashes, bruises or suspicious lesions. Lymph: No cervical or inguinal adenopathy. Neurologic: Grossly intact, no focal deficits, moving all 4 extremities. Psychiatric: Normal mood and affect.  Laboratory Data: Lab Results  Component Value Date   WBC 12.8 (H) 12/16/2015   HGB 10.6 (L) 12/16/2015   HCT 30.5 (L) 12/16/2015   MCV 90.5 12/16/2015   PLT 290 12/16/2015    Lab Results  Component Value Date   CREATININE 0.97 12/16/2015    Lab Results  Component Value Date   HGBA1C 6.0 11/23/2015    Lab Results  Component Value Date   TSH 1.94 07/02/2013    Lab Results  Component Value Date   AST 37 12/14/2015   Lab Results  Component Value Date   ALT 25 12/14/2015    Pertinent Imaging: Results for HOLT, LEARD (MRN [id number]) as of 04/13/2016 10:43  Ref. Range 04/13/2016 10:32  Scan Result Unknown 0   CT abdomen and pelvis, without and with IV contrast  Comparison:CT of abdomen with and without contrast May 14, 2013.  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 Follow-up of too small to characterize renal lesions.  Technique:CT imaging was performed of the abdomen and pelvis without and with IV contrast. The patient received intravenous contrast (Isovue-300, 150 mL at 3 mL/sec) without incident.A genitourinary protocol CT was performed, with noncontrast imaging from the kidneys through the bladder, nephrographic phase imaging through the kidneys, and excretory phase imaging from the kidneys through the bladder. Iodinated  contrast was used due to the indications for the examination, to  improve disease detection and to further define anatomy. This examination was interpreted using coronal reformatted images to optimize visualization of the collecting system and ureters. The most recent serum creatinine is 1.1 mg/dL.  Findings:  Atelectatic changes in the bilateral lung bases the heart is not enlarged. Coronary artery calcifications are noted.  No focal hepatic or splenic mass identified. No pancreatic mass or peripancreatic fluid collection identified. Bilateral adrenal glands appear within limits of normal. Redemonstrated is a tiny hypodensity within the left upper pole which measures approximately 8 mm in AP dimension. In greatest dimension and appears unchanged from the prior study. Previously, this measured approximately 6 mm in AP dimension. There is no appreciable contrast enhancement within this lesion. Although too small to definitively characterize, a benign process is favored.  Also redemonstrated more caudally within the left renal cortex is a 2.0 x 2.9 cm bi-lobular nonenhancing hypodensity. This previously measured 2.9 x 2.2 cm. It is unchanged in the interval. Subcentimeter nonenhancing hypodensity is also noted within the right upper pole renal cortex which currently measures 1.1 cm, previously 1.2 cm. It is unchanged in interval. Several nonobstructing right renal stones are noted. There is no evidence of hydroureteronephrosis.  No abnormal bowel dilation, bowel wall thickening, or mesenteric inflammatory change. Colonic diverticulosis. No pelvic mass or fluid collection identified. Multilevel degenerative changes of the spine are noted. Postoperative changes are noted of the lower lumbar spine without evidence of hardware failure. There is a new well-defined lytic defect within the right iliac bone with cortical breakthrough measuring 1.9 x 3.5 cm. This likely is postoperative in  nature relating to a bone graft from prior spine surgery. It was not present on the previous CT study of May 29, 2012.  Impression: Multiple stable bilateral renal cortical hypodensities. The majority of these are too small to definitively characterize, but are likely benign. The largest bilobed cyst within the left kidney is stable in size in the interval and does not demonstrate solid enhancement.  Electronically Reviewed [id number] Rodney Booze, MD Electronically Reviewed on:02/10/2014 12:06 PM  I have reviewed the images and concur with the above findings.  Electronically Signed [id number] Bretta Bang, MD Electronically Signed on:02/10/2014 12:34 PM  Assessment & Plan:    1. BPH with LUTS  - IPSS score is 13/3  - Continue conservative management, avoiding bladder irritants and timed voiding's  - Continue tamsulosin 0.4 mg daily and finasteride 5 mg daily  - patient given a trial of Myrbetriq 50 mg daily, # 28 samples given for his frequency  - RTC in one month for IPSS and PVR   - BLADDER SCAN AMB NON-IMAGING  2. Frequency  - see above  3. Balanitis  - advised the patient to pull back the foreskin twice daily and wash the head of the penis with soapy water and then dry it with hairdryer being careful not to burn the head of the penis  4. Renal cysts  - patient was found to have bilateral renal cortical hypodensities and a larger bilobed cyst in the left kidney  - order RUS to check the status of the cysts  5. Nocturia  - I explained to the patient that nocturia is often multi-factorial and difficult to treat.  Sleeping disorders, heart conditions, peripheral vascular disease, diabetes, an enlarged prostate for men, an urethral stricture causing bladder outlet obstruction and/or certain medications can contribute to nocturia.  - I have suggested that the patient avoid caffeine after noon and alcohol in the evening.  He or she  may also benefit from fluid restrictions  after 6:00 in the evening and voiding just prior to bedtime.  - I have explained that research studies have showed that over 84% of patients with sleep apnea reported frequent nighttime urination.   With sleep apnea, oxygen decreases, carbon dioxide increases, the blood become more acidic, the heart rate drops and blood vessels in the lung constrict.  The body is then alerted that something is very wrong. The sleeper must wake enough to reopen the airway. By this time, the heart is racing and experiences a false signal of fluid overload. The heart excretes a hormone-like protein that tells the body to get rid of sodium and water, resulting in nocturia.  -  I also informed the patient that a recent study noted that decreasing sodium intake to 2.3 grams daily, if they don't have issues with hyponatremia, can also reduce the number of nightly voids  - The patient may benefit from a discussion with his or her primary care physician to see if he or she has risk factors for sleep apnea or other sleep disturbances and obtaining a repeated sleep study as he has gained weight   Return in about 3 weeks (around 05/04/2016) for IPSS and PVR .  These notes generated with voice recognition software. I apologize for typographical errors.  Zara Council, [facility] [address] [place], [place] [address] [phone]

## 2016-04-16 ENCOUNTER — Telehealth: Payer: Self-pay | Admitting: *Deleted

## 2016-04-16 NOTE — Telephone Encounter (Signed)
-----   Message from Nori Riis, PA-C sent at 04/15/2016 10:12 PM EDT ----- Please notify the patient that his PSA was normal.

## 2016-04-16 NOTE — Telephone Encounter (Signed)
Spoke with patient and gave results. Patient ok with plan. 

## 2016-05-03 ENCOUNTER — Ambulatory Visit
Admission: RE | Admit: 2016-05-03 | Discharge: 2016-05-03 | Disposition: A | Discharge status: Home / Self Care | Payer: Medicare Other | Source: Ambulatory Visit | Attending: Urology | Admitting: Urology

## 2016-05-03 ENCOUNTER — Other Ambulatory Visit: Payer: Self-pay | Admitting: *Deleted

## 2016-05-03 DIAGNOSIS — N281 Cyst of kidney, acquired: Secondary | ICD-10-CM | POA: Insufficient documentation

## 2016-05-03 DIAGNOSIS — N400 Enlarged prostate without lower urinary tract symptoms: Secondary | ICD-10-CM

## 2016-05-04 ENCOUNTER — Ambulatory Visit: Payer: Medicare Other | Admitting: Urology

## 2016-05-07 ENCOUNTER — Telehealth: Payer: Self-pay

## 2016-05-07 NOTE — Telephone Encounter (Signed)
-----   Message from Nori Riis, PA-C sent at 05/03/2016  9:51 PM EST ----- Please notify the patient that his kidney cysts are stable.

## 2016-05-07 NOTE — Telephone Encounter (Signed)
Spoke with pt in reference to kidney cyst. Pt stated that cyst are not stable and something is not right. Pt stated that he will be going back to his PCP this week.

## 2016-05-25 ENCOUNTER — Emergency Department
Admission: EM | Admit: 2016-05-25 | Discharge: 2016-05-25 | Disposition: A | Discharge status: Home / Self Care | Payer: Medicare Other | Attending: Emergency Medicine | Admitting: Emergency Medicine

## 2016-05-25 DIAGNOSIS — I10 Essential (primary) hypertension: Secondary | ICD-10-CM | POA: Insufficient documentation

## 2016-05-25 DIAGNOSIS — Z7982 Long term (current) use of aspirin: Secondary | ICD-10-CM | POA: Diagnosis not present

## 2016-05-25 DIAGNOSIS — M25531 Pain in right wrist: Secondary | ICD-10-CM | POA: Diagnosis present

## 2016-05-25 DIAGNOSIS — Z79899 Other long term (current) drug therapy: Secondary | ICD-10-CM | POA: Diagnosis not present

## 2016-05-25 DIAGNOSIS — Z87891 Personal history of nicotine dependence: Secondary | ICD-10-CM | POA: Insufficient documentation

## 2016-05-25 DIAGNOSIS — Z7984 Long term (current) use of oral hypoglycemic drugs: Secondary | ICD-10-CM | POA: Diagnosis not present

## 2016-05-25 DIAGNOSIS — M10031 Idiopathic gout, right wrist: Secondary | ICD-10-CM | POA: Insufficient documentation

## 2016-05-25 DIAGNOSIS — M109 Gout, unspecified: Secondary | ICD-10-CM

## 2016-05-25 MED ORDER — ONDANSETRON 4 MG PO TBDP
4.0000 mg | ORAL_TABLET | Freq: Once | ORAL | Status: AC
  Administered 2016-05-25: 4 mg via ORAL
  Filled 2016-05-25: qty 1

## 2016-05-25 MED ORDER — OXYCODONE-ACETAMINOPHEN 5-325 MG PO TABS
1.0000 | ORAL_TABLET | Freq: Once | ORAL | Status: AC
  Administered 2016-05-25: 1 via ORAL
  Filled 2016-05-25: qty 1

## 2016-05-25 MED ORDER — OXYCODONE-ACETAMINOPHEN 5-325 MG PO TABS: 1.0000 | ORAL_TABLET | ORAL | 0 refills | Status: DC | PRN

## 2016-05-25 MED ORDER — KETOROLAC TROMETHAMINE 30 MG/ML IJ SOLN
30.0000 mg | Freq: Once | INTRAMUSCULAR | Status: AC
  Administered 2016-05-25: 30 mg via INTRAMUSCULAR
  Filled 2016-05-25: qty 1

## 2016-05-25 NOTE — Discharge Instructions (Signed)
1. Continue prednisone and allopurinol. You may take Percocet as needed for more severe pain. 2. Return to the ER for worsening symptoms, persistent vomiting, difficulty breathing or other concerns.

## 2016-05-25 NOTE — ED Notes (Signed)
Discharge instructions reviewed with patient. Questions fielded by this RN. Patient verbalizes understanding of instructions. Patient discharged home in stable condition per Beather Arbour MD . No acute distress noted at time of discharge.

## 2016-05-25 NOTE — ED Notes (Signed)
Pt c/o pain in right hand/wrist that radiates up to shoulder. Pt reports hx of gout. Pt reports he went to [facility] on Tuesday and was diagnosed with gout in right hand; Pt given allopurinol and prednisone. Pt reports no relief of symptoms/pain with medication. Pt's hand is reddened and warm to touch

## 2016-05-25 NOTE — ED Triage Notes (Signed)
Pt here for pain to right hand and right wrist was pmd last week and dx with gout. Was started on meds but pt has no relief.

## 2016-05-25 NOTE — ED Provider Notes (Signed)
[facility] Emergency Department Provider Note   ____________________________________________   First MD Initiated Contact with Patient 05/25/16 0500     (approximate)  I have reviewed the triage vital signs and the nursing notes.   HISTORY  Chief Complaint Gout    HPI Thomas Mcdaniel is a 70 y.o. male who presents to the ED from home with a chief complaint of right wrist pain. Patient has a history of gouty arthritis. Was seen by his PCP last week and started on prednisone and allopurinol for right wrist pain secondary to gout. States the medications are not alleviating the pain. Denies associated fever, chills, chest pain, shortness breath, abdominal pain, nausea, vomiting, diarrhea. Denies recent travel or trauma. Nothing makes the pain better. Movement makes the pain worse.   Past Medical History:  Diagnosis Date  . BPH (benign prostatic hyperplasia)   . Complication of anesthesia    difficult to wake up after a lithotripsy  . DJD (degenerative joint disease)   . GERD (gastroesophageal reflux disease)   . Glucose intolerance (impaired glucose tolerance)   . Gout   . Hard of hearing   . Hemorrhoids   . Hyperlipidemia   . Hypertension   . Lumbar spinal stenosis   . Nephrolithiasis   . Obesity   . Personal history of PE (pulmonary embolism)   . PONV (postoperative nausea and vomiting)    after cataract surgery  . Scoliosis     Patient Active Problem List   Diagnosis Date Noted  . Altered mental status 12/14/2015  . Altered mental state 12/14/2015  . 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 CATARACT EXTRACTION    . COLONOSCOPY WITH PROPOFOL N/A 08/01/2015   Procedure: COLONOSCOPY WITH PROPOFOL;  Surgeon: Josefine Class, MD;  Location: [facility] ENDOSCOPY;  Service: Endoscopy;  Laterality: N/A;  . FEMORAL ARTERY REPAIR    . HERNIA REPAIR    . KNEE ARTHROPLASTY Right 12/05/2015   Procedure: COMPUTER ASSISTED TOTAL KNEE ARTHROPLASTY;  Surgeon: Dereck Leep, MD;  Location: [facility] ORS;  Service: Orthopedics;  Laterality: Right;  . knee arthroscopy    . LAMINECTOMY    . LITHOTRIPSY    . LITHOTRIPSY    . SPINAL FUSION    . TOE AMPUTATION    . TONSILLECTOMY      Prior to Admission medications   Medication Sig Start Date End Date Taking? Authorizing Provider  amLODipine (NORVASC) 10 MG tablet Take 10 mg by mouth daily.    Historical Provider, MD  aspirin EC 325 MG tablet Take 1 tablet (325 mg total) by mouth daily. Patient not taking: Reported on 04/13/2016 12/16/15   Hillary Bow, MD  ciprofloxacin (CIPRO) 500 MG tablet Take 1 tablet (500 mg total) by mouth 2 (two) times daily. Patient not taking: Reported on 04/13/2016 12/16/15   Hillary Bow, MD  clindamycin (CLEOCIN) 300 MG capsule Take 300 mg by mouth 3 (three) times daily.    Historical Provider, MD  colchicine 0.6 MG tablet Take 0.6 mg by mouth 2 (two) times daily as needed.     Historical Provider, MD  enoxaparin (LOVENOX) 40 MG/0.4ML injection Inject 0.4 mLs (40 mg total) into the skin daily. Patient not taking: Reported on 04/13/2016 12/06/15   Watt Climes, PA  famotidine (PEPCID) 40 MG tablet Take 40 mg by mouth daily as needed.     Historical Provider, MD  finasteride (PROSCAR) 5 MG tablet  Take by mouth. 04/05/16 04/05/17  Historical Provider, MD  lisinopril (PRINIVIL,ZESTRIL) 40 MG tablet Take 40 mg by mouth daily.    Historical Provider, MD  metFORMIN (GLUCOPHAGE-XR) 500 MG 24 hr tablet Take by mouth. 04/11/16 04/11/17  Historical Provider, MD  omeprazole (PRILOSEC) 40 MG capsule Take by mouth.    Historical Provider, MD  oxyCODONE-acetaminophen (ROXICET) 5-325 MG tablet Take 1 tablet by mouth every 4 (four) hours as needed for severe pain. 05/25/16   Paulette Blanch, MD  pravastatin (PRAVACHOL) 10 MG tablet Take by mouth. 04/10/16 04/10/17  Historical Provider, MD  tamsulosin (FLOMAX) 0.4 MG CAPS capsule Take by  mouth.    Historical Provider, MD  traMADol (ULTRAM) 50 MG tablet Take 1-2 tablets (50-100 mg total) by mouth every 4 (four) hours as needed for moderate pain. 12/06/15   Watt Climes, PA  vitamin B-12 (CYANOCOBALAMIN) 1000 MCG tablet Take 1,000 mcg by mouth daily.    Historical Provider, MD    Allergies Statins  Family History  Problem Relation Age of Onset  . Prostate cancer Neg Hx   . Kidney disease Neg Hx     Social History Social History  Substance Use Topics  . Smoking status: Former Smoker    Quit date: 07/31/1992  . Smokeless tobacco: Never Used  . Alcohol use No    Review of Systems  Constitutional: No fever/chills. Eyes: No visual changes. ENT: No sore throat. Cardiovascular: Denies chest pain. Respiratory: Denies shortness of breath. Gastrointestinal: No abdominal pain.  No nausea, no vomiting.  No diarrhea.  No constipation. Genitourinary: Negative for dysuria. Musculoskeletal: Positive for right wrist pain. Negative for back pain. Skin: Negative for rash. Neurological: Negative for headaches, focal weakness or numbness.  10-point ROS otherwise negative.  ____________________________________________   PHYSICAL EXAM:  VITAL SIGNS: ED Triage Vitals [05/25/16 0427]  Enc Vitals Group     BP (!) 178/92     Pulse Rate 74     Resp 18     Temp 97.5 F (36.4 C)     Temp Source Oral     SpO2 96 %     Weight 256 lb (116.1 kg)     Height 5\' 9"  (1.753 m)     Head Circumference      Peak Flow      Pain Score 10     Pain Loc      Pain Edu?      Excl. in [place]?     Constitutional: Alert and oriented. Well appearing and in no acute distress. Eyes: Conjunctivae are normal. PERRL. EOMI. Head: Atraumatic. Nose: No congestion/rhinnorhea. Mouth/Throat: Mucous membranes are moist.  Oropharynx non-erythematous. Neck: No stridor.   Cardiovascular: Normal rate, regular rhythm. Grossly normal heart sounds.  Good peripheral circulation. Respiratory: Normal respiratory  effort.  No retractions. Lungs CTAB. Gastrointestinal: Soft and nontender. No distention. No abdominal bruits. No CVA tenderness. Musculoskeletal: Right wrist with mild to moderate swelling. Warmth over the dorsal aspect of wrist. No joint effusions. Limited range of motion secondary to pain. No erythema or other suggestion for septic joint. 2+ radial pulse. Brisk, less than 5 second capillary refill. 5/5 motor strength and sensation. Neurologic:  Normal speech and language. No gross focal neurologic deficits are appreciated. No gait instability. Skin:  Skin is warm, dry and intact. No rash noted. Psychiatric: Mood and affect are normal. Speech and behavior are normal.  ____________________________________________   LABS (all labs ordered are listed, but only abnormal results are displayed)  Labs Reviewed - No data to display ____________________________________________  EKG  None ____________________________________________  RADIOLOGY  None ____________________________________________   PROCEDURES  Procedure(s) performed: None  Procedures  Critical Care performed: No  ____________________________________________   INITIAL IMPRESSION / ASSESSMENT AND PLAN / ED COURSE  Pertinent labs & imaging results that were available during my care of the patient were reviewed by me and considered in my medical decision making (see chart for details).  70 year old male who presents with nontraumatic right wrist pain secondary to gouty arthritis flareup. Has both wrist splint as well as Ace bandage. Will add analgesia to patient's current regimen of allopurinol and prednisone. Advised elevation, ice and follow-up with his PCP next week. Strict return precautions given. Patient and spouse verbalize understanding and agree with plan of care.  Clinical Course      ____________________________________________   FINAL CLINICAL IMPRESSION(S) / ED DIAGNOSES  Final diagnoses:  Acute  gout of right wrist, unspecified cause      NEW MEDICATIONS STARTED DURING THIS VISIT:  New Prescriptions   OXYCODONE-ACETAMINOPHEN (ROXICET) 5-325 MG TABLET    Take 1 tablet by mouth every 4 (four) hours as needed for severe pain.     Note:  This document was prepared using Dragon voice recognition software and may include unintentional dictation errors.    Paulette Blanch, MD 05/25/16 [phone]

## 2016-05-28 ENCOUNTER — Ambulatory Visit: Payer: Medicare Other | Admitting: Urology

## 2017-01-28 DIAGNOSIS — M7551 Bursitis of right shoulder: Secondary | ICD-10-CM | POA: Insufficient documentation

## 2017-03-20 IMAGING — DX DG KNEE 1-2V PORT*R*
2 series · 2 of 2 positions shown · non-contrast
Comparison: None.

CLINICAL DATA: Status post right total knee replacement today.
Postoperative exam.

EXAM:
PORTABLE RIGHT KNEE - 1-2 VIEW

[knee ap]
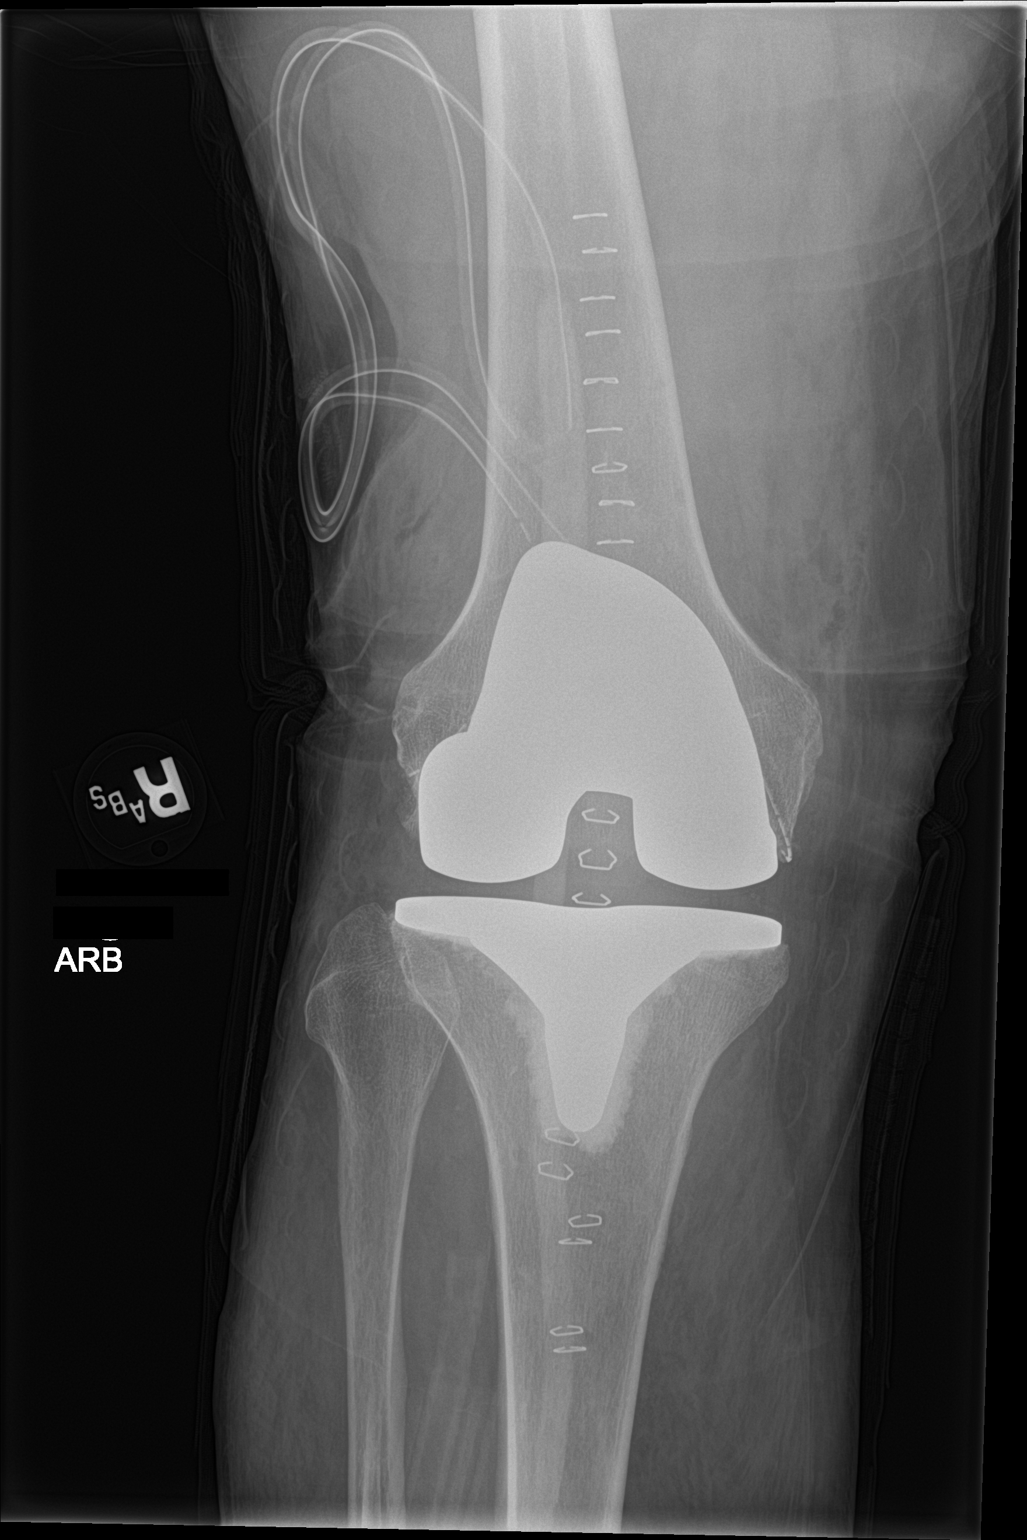

[knee lat]
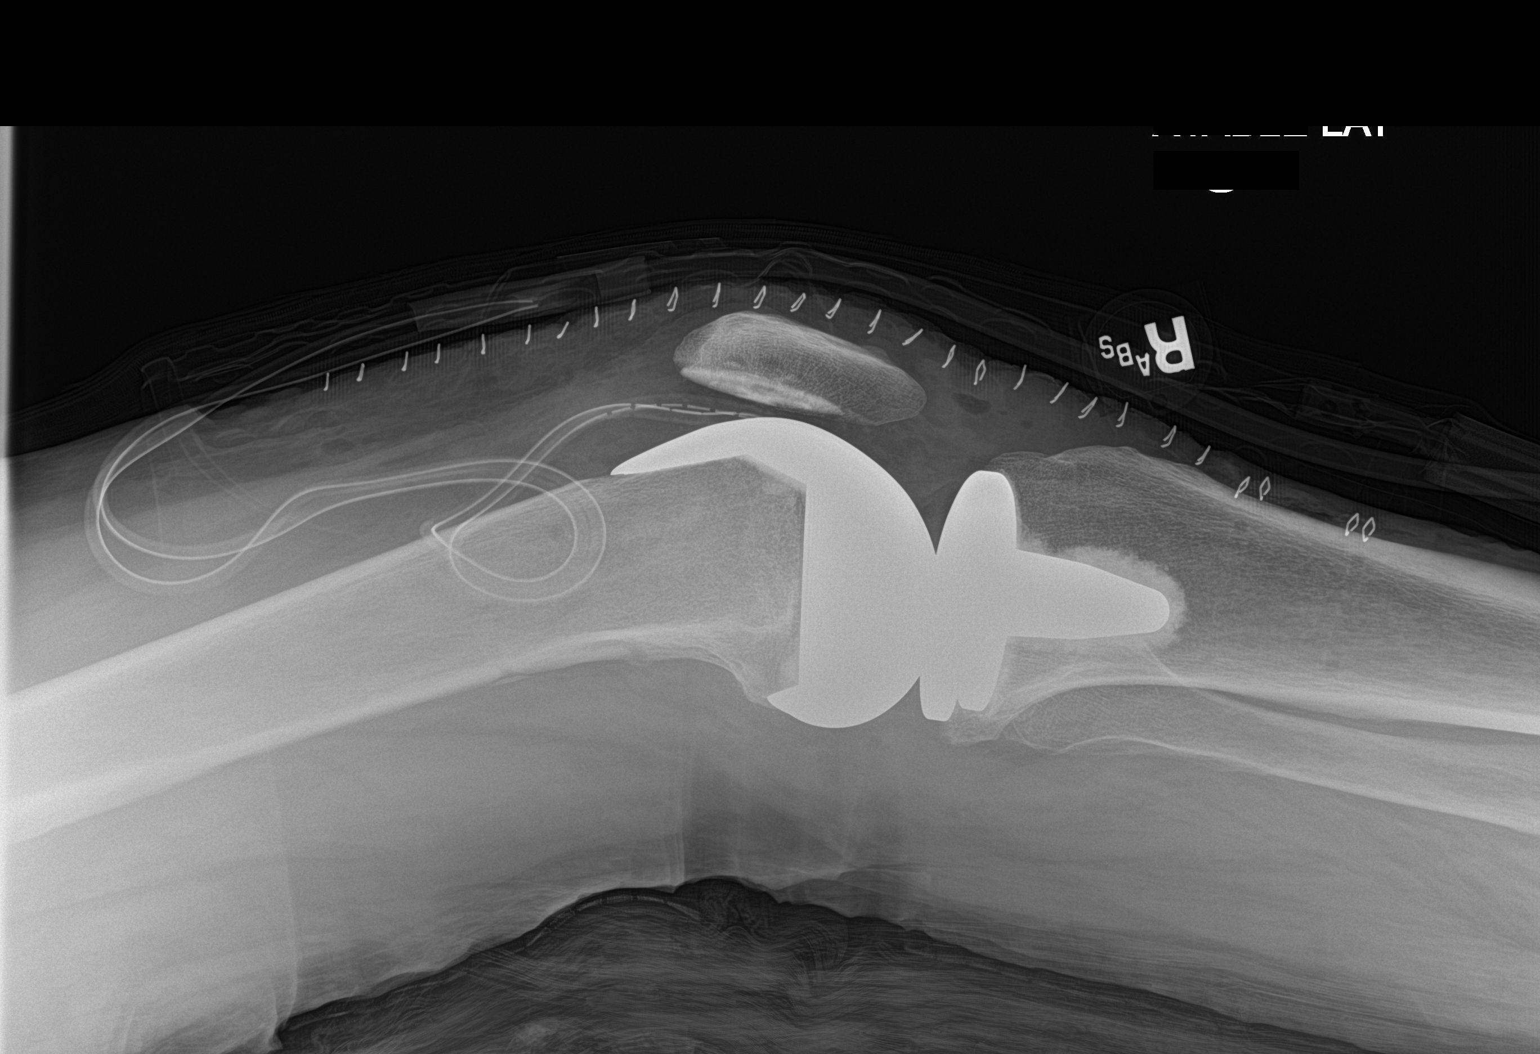

[2 of 2 positions shown; findings below may reference images not displayed]

FINDINGS: A right total knee arthroplasty is in place. The device is located
and there is no fracture. Surgical drain and staples are noted.
IMPRESSION: Right total knee replacement.  No acute abnormality.

## 2017-03-29 IMAGING — CT CT HEAD W/O CM
3 of 4 series · 15 of 47 positions shown, 18 images · non-contrast
Comparison: None

CLINICAL DATA: 69-year-old male with altered mental status

EXAM:
CT HEAD WITHOUT CONTRAST
TECHNIQUE: Contiguous axial images were obtained from the base of the skull
through the vertex without intravenous contrast.

[Series 2: head wo · axial · 0.43mm/px · z∈[-19,+126]mm · 9 of 35 slices shown, 12 images]
[im 3/35  brain]
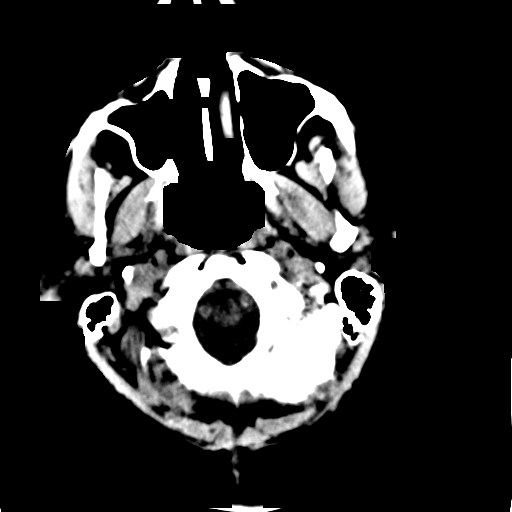
[im 3/35  bone]
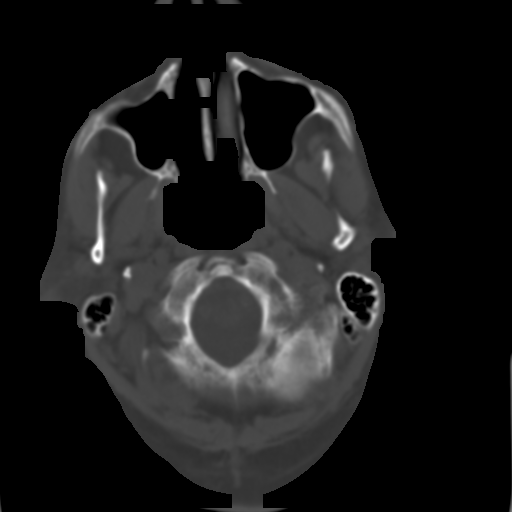
[im 8/35  brain]
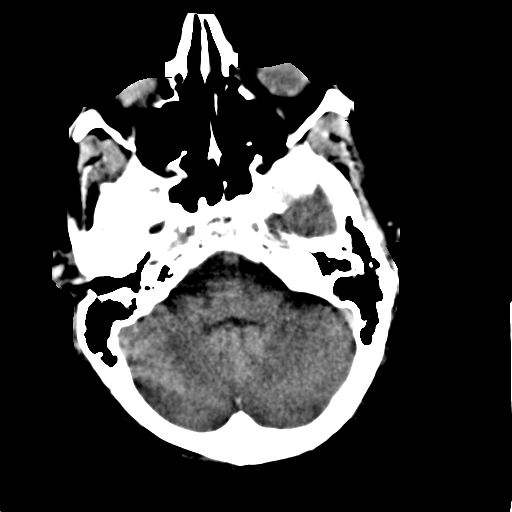
[im 10/35  brain]
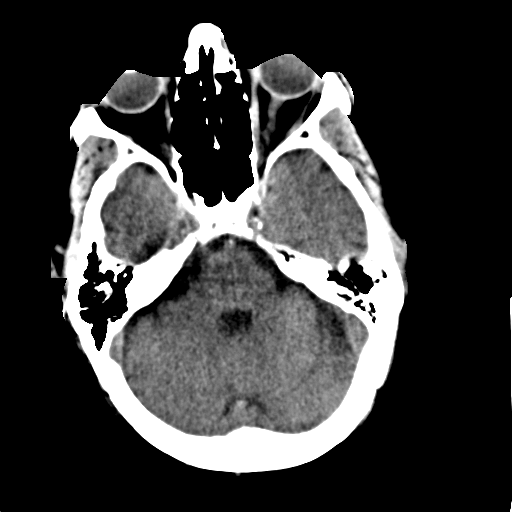
[im 15/35  brain]
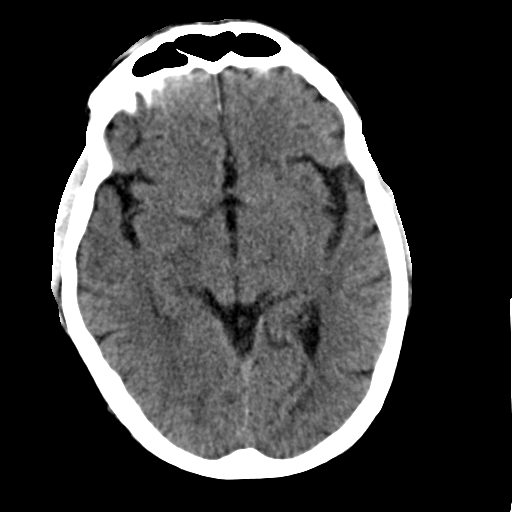
[im 18/35  brain]
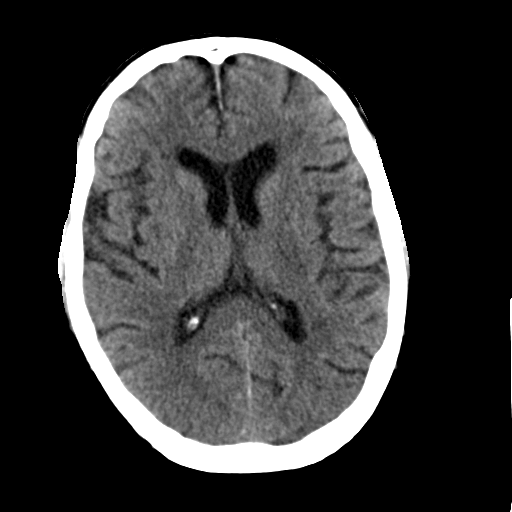
[im 18/35  bone]
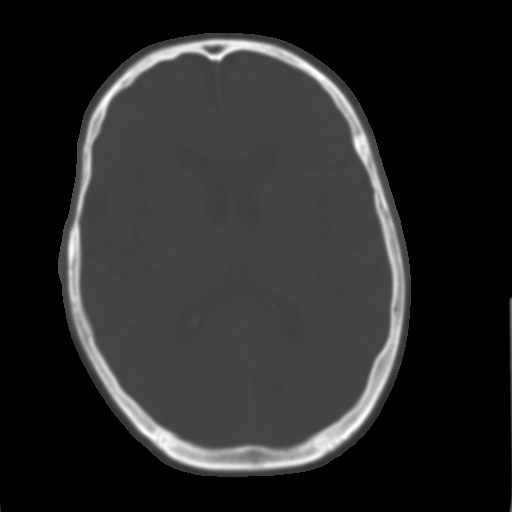
[im 20/35  brain]
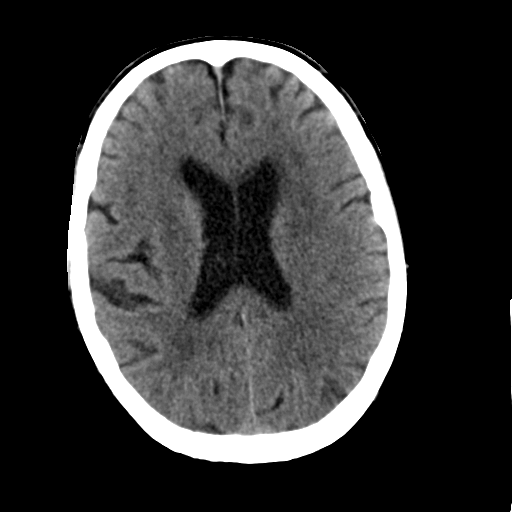
[im 25/35  brain]
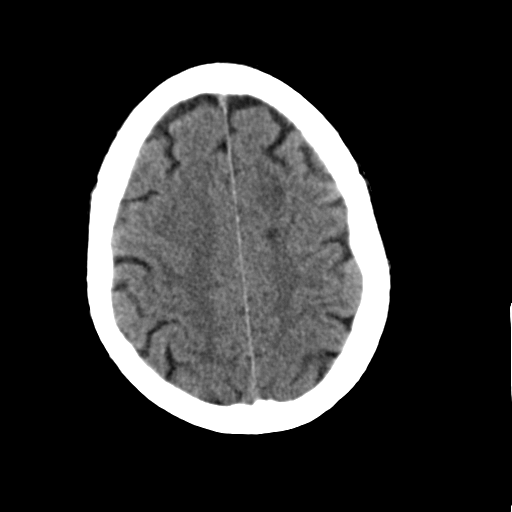
[im 27/35  brain]
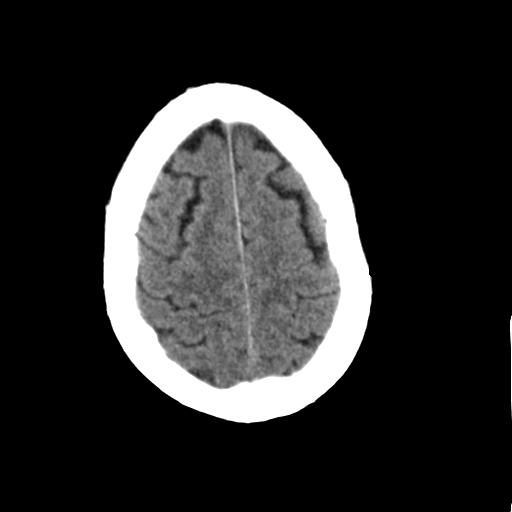
[im 32/35  brain]
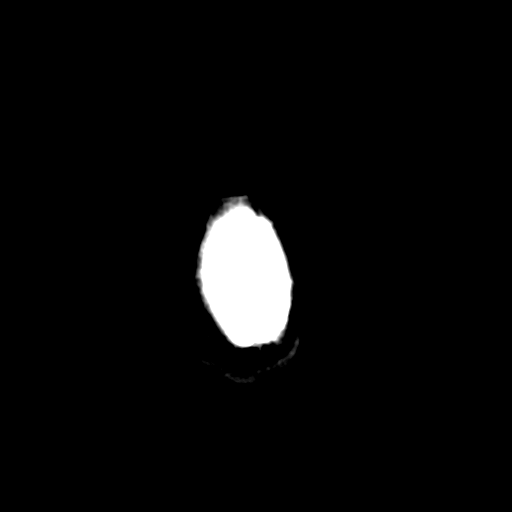
[im 32/35  bone]
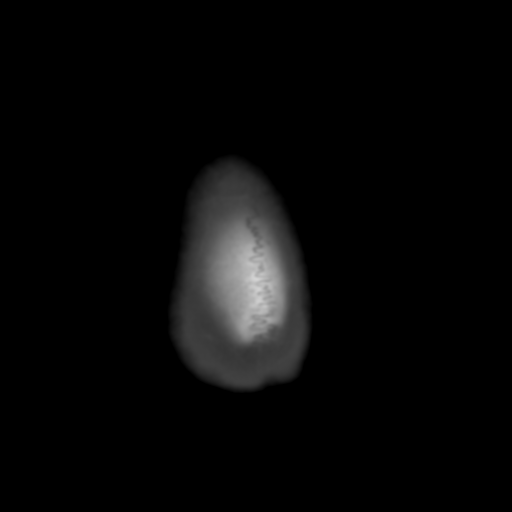

[Series 4: coronal soft tissue · coronal · 0.36mm/px · 3 of 67 slices shown]
[im 23/67  brain]
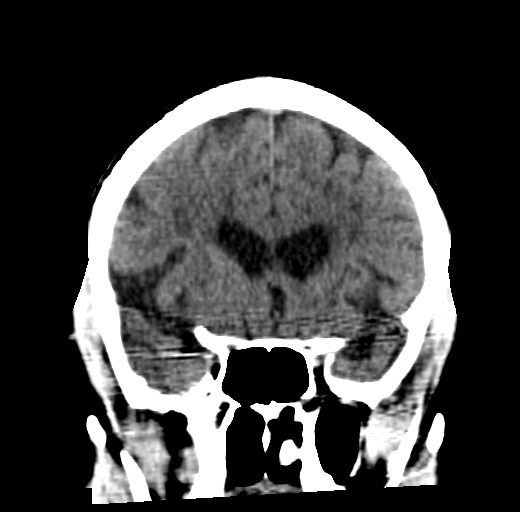
[im 30/67  brain]
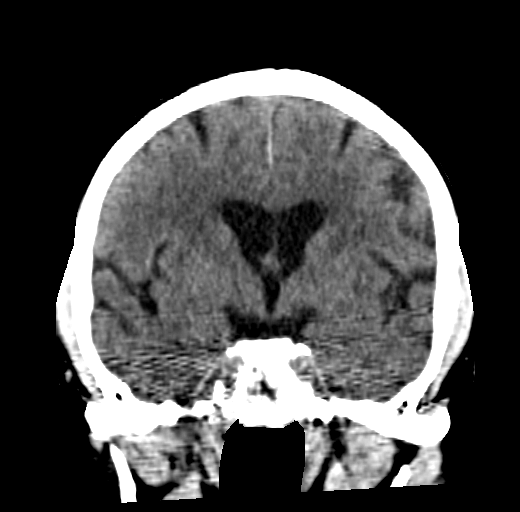
[im 37/67  brain]
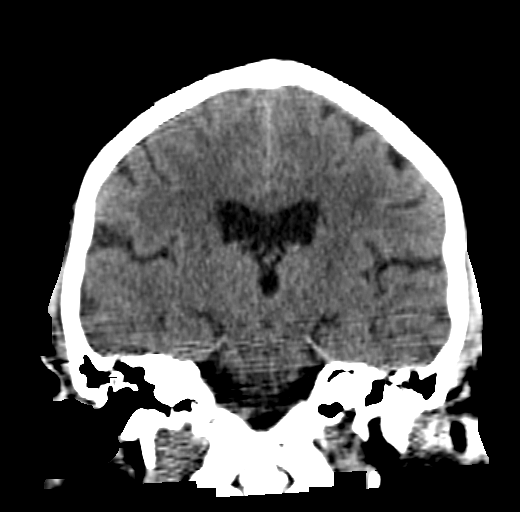

[Series 5: sagittal soft tissue · sagittal · 0.35mm/px · 3 of 54 slices shown]
[im 18/54  brain]
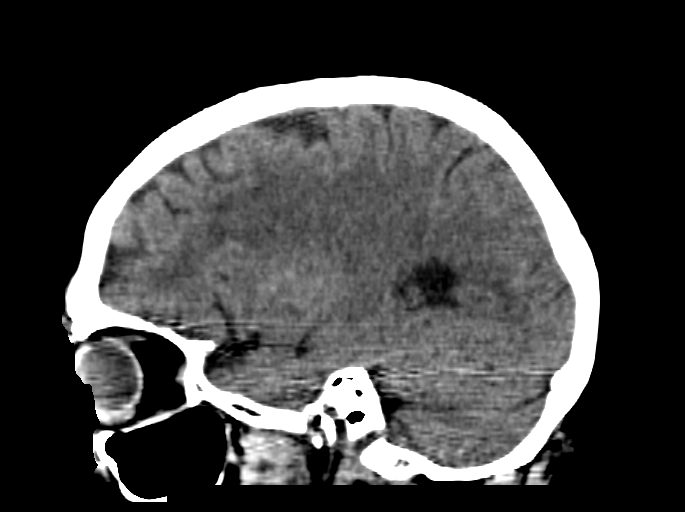
[im 27/54  brain]
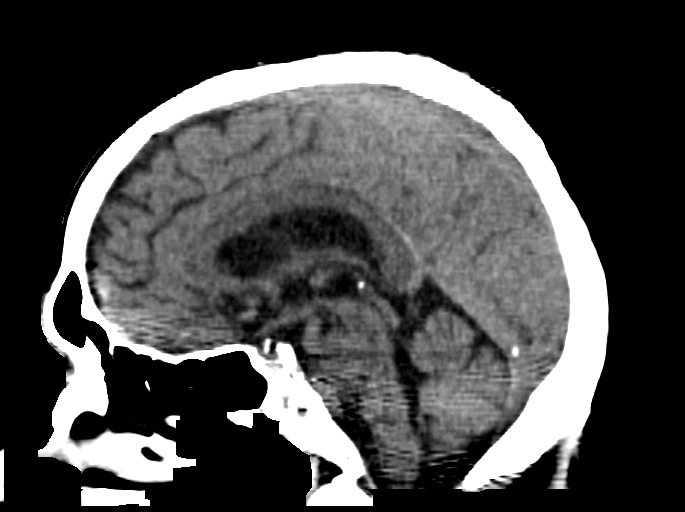
[im 36/54  brain]
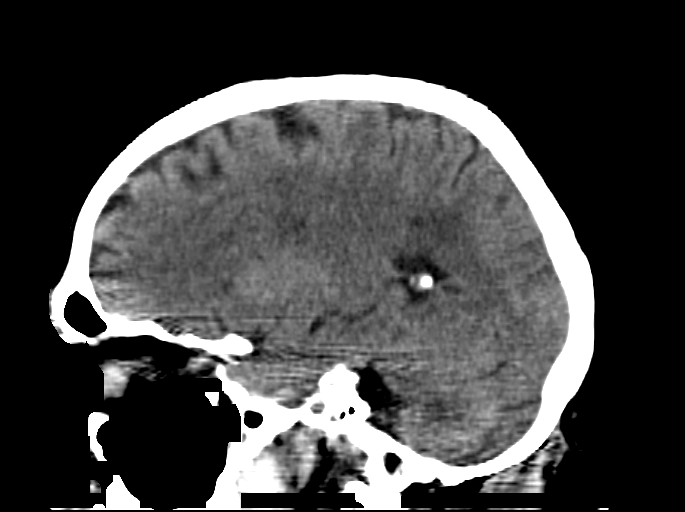

[15 of 47 positions shown; findings below may reference images not displayed]

FINDINGS: The ventricles and sulci are appropriate in size for patient's age.
Mild periventricular and deep white matter chronic microvascular
ischemic changes noted. There is no acute intracranial hemorrhage.
No mass effect or midline shift.

The visualized paranasal sinuses and mastoid air cells are clear.
The calvarium is intact.
IMPRESSION: No acute intracranial hemorrhage.

Mild age-related atrophy and chronic microvascular ischemic disease.

If symptoms persist and there are no contraindications, MRI may
provide better evaluation if clinically indicated

## 2017-04-21 ENCOUNTER — Encounter: Payer: Self-pay | Admitting: Emergency Medicine

## 2017-04-21 ENCOUNTER — Emergency Department
Admission: EM | Admit: 2017-04-21 | Discharge: 2017-04-21 | Disposition: A | Discharge status: Home / Self Care | Payer: Medicare Other | Attending: Emergency Medicine | Admitting: Emergency Medicine

## 2017-04-21 DIAGNOSIS — M25532 Pain in left wrist: Secondary | ICD-10-CM | POA: Diagnosis present

## 2017-04-21 DIAGNOSIS — M10032 Idiopathic gout, left wrist: Secondary | ICD-10-CM | POA: Diagnosis not present

## 2017-04-21 DIAGNOSIS — Z79899 Other long term (current) drug therapy: Secondary | ICD-10-CM | POA: Diagnosis not present

## 2017-04-21 DIAGNOSIS — G9009 Other idiopathic peripheral autonomic neuropathy: Secondary | ICD-10-CM | POA: Diagnosis not present

## 2017-04-21 DIAGNOSIS — M109 Gout, unspecified: Secondary | ICD-10-CM

## 2017-04-21 DIAGNOSIS — Z87891 Personal history of nicotine dependence: Secondary | ICD-10-CM | POA: Diagnosis not present

## 2017-04-21 DIAGNOSIS — Z96651 Presence of right artificial knee joint: Secondary | ICD-10-CM | POA: Insufficient documentation

## 2017-04-21 DIAGNOSIS — I10 Essential (primary) hypertension: Secondary | ICD-10-CM | POA: Diagnosis not present

## 2017-04-21 DIAGNOSIS — G6289 Other specified polyneuropathies: Secondary | ICD-10-CM

## 2017-04-21 DIAGNOSIS — M419 Scoliosis, unspecified: Secondary | ICD-10-CM | POA: Diagnosis not present

## 2017-04-21 MED ORDER — TRIAMCINOLONE ACETONIDE 40 MG/ML IJ SUSP
50.0000 mg | Freq: Once | INTRAMUSCULAR | Status: AC
  Administered 2017-04-21: 50 mg via INTRAMUSCULAR
  Filled 2017-04-21: qty 1.25

## 2017-04-21 NOTE — ED Triage Notes (Signed)
Pt presents to ED with c/o toe pain to his 3rd and 4th toes on his left foot. Redness noted. Toes warm to the touch. Pain has been ongoing for several year but pain is worse yesterday and today. Also c/o left wrist pain. Pt reports having a hx of gout and states this feels

## 2017-04-21 NOTE — ED Provider Notes (Signed)
[facility] Emergency Department Provider Note  ____________________________________________   First MD Initiated Contact with Patient 04/21/17 0601     (approximate)  I have reviewed the triage vital signs and the nursing notes.   HISTORY  Chief Complaint Toe Pain; Gout; and Wrist Pain   HPI Thomas Mcdaniel is a 71 y.o. 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  He says that he had similar flares this past March when he saw Dr. Caryl Comes when he got a shot and colchicine which resolved his symptoms.  He has been taking colchicine over the past several days without resolution of his symptoms.  Also taking Tylenol without resolution.  Says that he notes swelling to his left wrist.  Does not report fever.  Also with redness to his left fourth and fifth toes.   Past Medical History:  Diagnosis Date  . BPH (benign prostatic hyperplasia)   . Complication of anesthesia    difficult to wake up after a lithotripsy  . DJD (degenerative joint disease)   . GERD (gastroesophageal reflux disease)   . Glucose intolerance (impaired glucose tolerance)   . Gout   . Hard of hearing   . Hemorrhoids   . Hyperlipidemia   . Hypertension   . Lumbar spinal stenosis   . Nephrolithiasis   . Obesity   . Personal history of PE (pulmonary embolism)   . PONV (postoperative nausea and vomiting)    after cataract surgery  . Scoliosis     Patient Active Problem List   Diagnosis Date Noted  . Altered mental status 12/14/2015  . Altered mental state 12/14/2015  . 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 CATARACT EXTRACTION    . COLONOSCOPY WITH PROPOFOL N/A 08/01/2015   Procedure: COLONOSCOPY WITH PROPOFOL;   Surgeon: Josefine Class, MD;  Location: [facility] ENDOSCOPY;  Service: Endoscopy;  Laterality: N/A;  . FEMORAL ARTERY REPAIR    . HERNIA REPAIR    . KNEE ARTHROPLASTY Right 12/05/2015   Procedure: COMPUTER ASSISTED TOTAL KNEE ARTHROPLASTY;  Surgeon: Dereck Leep, MD;  Location: [facility] ORS;  Service: Orthopedics;  Laterality: Right;  . knee arthroscopy    . LAMINECTOMY    . LITHOTRIPSY    . LITHOTRIPSY    . SPINAL FUSION    . TOE AMPUTATION    . TONSILLECTOMY      Prior to Admission medications   Medication Sig Start Date End Date Taking? Authorizing Provider  amLODipine (NORVASC) 10 MG tablet Take 10 mg by mouth daily.    [provider]  aspirin EC 325 MG tablet Take 1 tablet (325 mg total) by mouth daily. Patient not taking: Reported on 04/13/2016 12/16/15   Hillary Bow, MD  ciprofloxacin (CIPRO) 500 MG tablet Take 1 tablet (500 mg total) by mouth 2 (two) times daily. Patient not taking: Reported on 04/13/2016 12/16/15   Hillary Bow, MD  clindamycin (CLEOCIN) 300 MG capsule Take 300 mg by mouth 3 (three) times daily.    [provider]  colchicine 0.6 MG tablet Take 0.6 mg by mouth 2 (two) times daily as needed.     [provider]  enoxaparin (LOVENOX) 40 MG/0.4ML injection  Inject 0.4 mLs (40 mg total) into the skin daily. Patient not taking: Reported on 04/13/2016 12/06/15   Watt Climes, PA  famotidine (PEPCID) 40 MG tablet Take 40 mg by mouth daily as needed.     [provider]  lisinopril (PRINIVIL,ZESTRIL) 40 MG tablet Take 40 mg by mouth daily.    [provider]  metFORMIN (GLUCOPHAGE-XR) 500 MG 24 hr tablet Take by mouth. 04/11/16 04/11/17  [provider]  omeprazole (PRILOSEC) 40 MG capsule Take by mouth.    [provider]  oxyCODONE-acetaminophen (ROXICET) 5-325 MG tablet Take 1 tablet by mouth every 4 (four) hours as needed for severe pain. 05/25/16   Paulette Blanch, MD  pravastatin (PRAVACHOL) 10 MG tablet  Take by mouth. 04/10/16 04/10/17  [provider]  tamsulosin (FLOMAX) 0.4 MG CAPS capsule Take by mouth.    [provider]  traMADol (ULTRAM) 50 MG tablet Take 1-2 tablets (50-100 mg total) by mouth every 4 (four) hours as needed for moderate pain. 12/06/15   Watt Climes, PA  vitamin B-12 (CYANOCOBALAMIN) 1000 MCG tablet Take 1,000 mcg by mouth daily.    [provider]    Allergies Statins  Family History  Problem Relation Age of Onset  . Prostate cancer Neg Hx   . Kidney disease Neg Hx     Social History Social History  Substance Use Topics  . Smoking status: Former Smoker    Quit date: 07/31/1992  . Smokeless tobacco: Never Used  . Alcohol use No    Review of Systems  Constitutional: No fever/chills Eyes: No visual changes. ENT: No sore throat. Cardiovascular: Denies chest pain. Respiratory: Denies shortness of breath. Gastrointestinal: No abdominal pain.  No nausea, no vomiting.  No diarrhea.  No constipation. Genitourinary: Negative for dysuria. Musculoskeletal: Negative for back pain. Skin: Negative for rash. Neurological: Negative for headaches, focal weakness or numbness.   ____________________________________________   PHYSICAL EXAM:  VITAL SIGNS: ED Triage Vitals  Enc Vitals Group     BP 04/21/17 0233 (!) 159/98     Pulse Rate 04/21/17 0233 89     Resp 04/21/17 0233 20     Temp 04/21/17 0233 98.5 F (36.9 C)     Temp Source 04/21/17 0233 Oral     SpO2 04/21/17 0233 95 %     Weight 04/21/17 0234 260 lb (117.9 kg)     Height 04/21/17 0234 5\' 9"  (1.753 m)     Head Circumference --      Peak Flow --      Pain Score --      Pain Loc --      Pain Edu? --      Excl. in [place]? --     Constitutional: Alert and oriented. Well appearing and in no acute distress. Eyes: Conjunctivae are normal.  Head: Atraumatic. Nose: No congestion/rhinnorhea. Mouth/Throat: Mucous membranes are moist.  Neck: No stridor.   Cardiovascular:  Normal rate, regular rhythm. Grossly normal heart sounds.  Good peripheral circulation with intact dorsalis pedis pulse to the left foot. Respiratory: Normal respiratory effort.  No retractions. Lungs CTAB. Gastrointestinal: Soft and nontender. No distention. No CVA tenderness. Musculoskeletal: Left wrist with mild swelling to the lateral dorsum with a small amount of erythema without warmth.  No induration.  Sensation is intact to light touch and the patient is distally neurovascularly intact.  Left fourth and fifth toes with dorsal erythema which appear to be from friction against his shoe or  socks.  No tenderness to palpation.  No deformity.  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  Patient with sensation to light touch.  Brisk capillary refill to the left foot toes. Neurologic:  Normal speech and language. No gross focal neurologic deficits are appreciated. Skin:  Skin is warm, dry and intact. No rash noted. Psychiatric: Mood and affect are normal. Speech and behavior are normal.  ____________________________________________   LABS (all labs ordered are listed, but only abnormal results are displayed)  Labs Reviewed - No data to display ____________________________________________  EKG   ____________________________________________  RADIOLOGY   ____________________________________________   PROCEDURES  Procedure(s) performed:   Procedures  Critical Care performed:   ____________________________________________   INITIAL IMPRESSION / ASSESSMENT AND PLAN / ED COURSE  Pertinent labs & imaging results that were available during my care of the patient were reviewed by me and considered in my medical decision making (see chart for details).  DDX: Gout, cellulitis, callus formation of the skin, septic joint  As part of my medical decision making, I reviewed the following data within the [facility] chart reviewed  Reviewed patient's past charts and  received a triamcinolone injection for the symptoms previously.  The patient said it resolved his wrist as well as toe symptoms.  We will give him an injection of triamcinolone here in the emergency department.  Patient offered a short course of Percocet but he would rather just get the shot and avoid opiate narcotics.  I think this is very reasonable.  He will also follow-up with his primary care doctor, Dr. Cleda Mccreedy.  We discussed keeping his foot out of the shoe over the next several days secondary to his foot pain in the areas of what appear to be irritation.        ____________________________________________   FINAL CLINICAL IMPRESSION(S) / ED DIAGNOSES  Gout flare.  Peripheral neuropathy.    NEW MEDICATIONS STARTED DURING THIS VISIT:  New Prescriptions   No medications on file     Note:  This document was prepared using Dragon voice recognition software and may include unintentional dictation errors.     Orbie Pyo, MD 04/21/17 [phone]

## 2017-08-12 DIAGNOSIS — R001 Bradycardia, unspecified: Secondary | ICD-10-CM | POA: Insufficient documentation

## 2017-09-08 ENCOUNTER — Other Ambulatory Visit: Payer: Self-pay

## 2017-09-08 ENCOUNTER — Emergency Department
Admission: EM | Admit: 2017-09-08 | Discharge: 2017-09-08 | Disposition: A | Discharge status: Home / Self Care | Payer: Medicare Other | Attending: Student in an Organized Health Care Education/Training Program | Admitting: Student in an Organized Health Care Education/Training Program

## 2017-09-08 DIAGNOSIS — M79672 Pain in left foot: Secondary | ICD-10-CM | POA: Diagnosis present

## 2017-09-08 DIAGNOSIS — Z87891 Personal history of nicotine dependence: Secondary | ICD-10-CM | POA: Diagnosis not present

## 2017-09-08 DIAGNOSIS — Z96651 Presence of right artificial knee joint: Secondary | ICD-10-CM | POA: Diagnosis not present

## 2017-09-08 DIAGNOSIS — L03032 Cellulitis of left toe: Secondary | ICD-10-CM | POA: Insufficient documentation

## 2017-09-08 DIAGNOSIS — Z79899 Other long term (current) drug therapy: Secondary | ICD-10-CM | POA: Insufficient documentation

## 2017-09-08 DIAGNOSIS — I10 Essential (primary) hypertension: Secondary | ICD-10-CM | POA: Insufficient documentation

## 2017-09-08 MED ORDER — TRAMADOL HCL 50 MG PO TABS: 50.0000 mg | ORAL_TABLET | Freq: Four times a day (QID) | ORAL | 0 refills | Status: DC | PRN

## 2017-09-08 MED ORDER — SULFAMETHOXAZOLE-TRIMETHOPRIM 800-160 MG PO TABS: 1.0000 | ORAL_TABLET | Freq: Two times a day (BID) | ORAL | 0 refills | Status: DC

## 2017-09-08 MED ORDER — BACITRACIN ZINC 500 UNIT/GM EX OINT
1.0000 "application " | TOPICAL_OINTMENT | Freq: Two times a day (BID) | CUTANEOUS | Status: DC
  Administered 2017-09-08: 1 via TOPICAL
  Filled 2017-09-08: qty 0.9

## 2017-09-08 NOTE — ED Provider Notes (Signed)
[facility] Emergency Department Provider Note  ____________________________________________  Time seen: Approximately 5:02 PM  I have reviewed the triage vital signs and the nursing notes.   HISTORY  Chief Complaint Foot Pain   HPI Thomas Mcdaniel is a 72 y.o. male who presents to the emergency department for treatment and evaluation of left 4th toe pain. He was evaluated by his PCP who gave him some "antibiotics" for an "infected corn." After 5 days, he finished the antibiotic and felt better. Over the the past 24 hours, pain has returned and the toe is swollen and has a "white spot" on it. He denies fever.   Past Medical History:  Diagnosis Date  . BPH (benign prostatic hyperplasia)   . Complication of anesthesia    difficult to wake up after a lithotripsy  . DJD (degenerative joint disease)   . GERD (gastroesophageal reflux disease)   . Glucose intolerance (impaired glucose tolerance)   . Gout   . Hard of hearing   . Hemorrhoids   . Hyperlipidemia   . Hypertension   . Lumbar spinal stenosis   . Nephrolithiasis   . Obesity   . Personal history of PE (pulmonary embolism)   . PONV (postoperative nausea and vomiting)    after cataract surgery  . Scoliosis     Patient Active Problem List   Diagnosis Date Noted  . Altered mental status 12/14/2015  . Altered mental state 12/14/2015  . 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 CATARACT EXTRACTION    . COLONOSCOPY WITH PROPOFOL N/A 08/01/2015   Procedure: COLONOSCOPY WITH PROPOFOL;  Surgeon: Josefine Class, MD;  Location: [facility] ENDOSCOPY;  Service: Endoscopy;  Laterality: N/A;  . FEMORAL ARTERY REPAIR    . HERNIA REPAIR    . KNEE ARTHROPLASTY Right 12/05/2015   Procedure: COMPUTER ASSISTED TOTAL KNEE ARTHROPLASTY;  Surgeon: Dereck Leep, MD;  Location: [facility] ORS;  Service: Orthopedics;  Laterality: Right;  . knee arthroscopy    .  LAMINECTOMY    . LITHOTRIPSY    . LITHOTRIPSY    . SPINAL FUSION    . TOE AMPUTATION    . TONSILLECTOMY      Prior to Admission medications   Medication Sig Start Date End Date Taking? Authorizing Provider  amLODipine (NORVASC) 10 MG tablet Take 10 mg by mouth daily.    [provider]  aspirin EC 325 MG tablet Take 1 tablet (325 mg total) by mouth daily. Patient not taking: Reported on 04/13/2016 12/16/15   Hillary Bow, MD  ciprofloxacin (CIPRO) 500 MG tablet Take 1 tablet (500 mg total) by mouth 2 (two) times daily. Patient not taking: Reported on 04/13/2016 12/16/15   Hillary Bow, MD  clindamycin (CLEOCIN) 300 MG capsule Take 300 mg by mouth 3 (three) times daily.    [provider]  colchicine 0.6 MG tablet Take 0.6 mg by mouth 2 (two) times daily as needed.     [provider]  enoxaparin (LOVENOX) 40 MG/0.4ML injection Inject 0.4 mLs (40 mg total) into the skin daily. Patient not taking: Reported on 04/13/2016 12/06/15   Watt Climes, PA  famotidine (PEPCID) 40 MG tablet Take 40 mg by mouth daily as needed.     [provider]  lisinopril (PRINIVIL,ZESTRIL) 40 MG tablet Take 40 mg by mouth daily.    [provider]  metFORMIN (GLUCOPHAGE-XR) 500 MG 24 hr tablet Take by mouth. 04/11/16  04/11/17  [provider]  omeprazole (PRILOSEC) 40 MG capsule Take by mouth.    [provider]  oxyCODONE-acetaminophen (ROXICET) 5-325 MG tablet Take 1 tablet by mouth every 4 (four) hours as needed for severe pain. 05/25/16   Paulette Blanch, MD  pravastatin (PRAVACHOL) 10 MG tablet Take by mouth. 04/10/16 04/10/17  [provider]  sulfamethoxazole-trimethoprim (BACTRIM DS,SEPTRA DS) 800-160 MG tablet Take 1 tablet by mouth 2 (two) times daily. 09/08/17   Demarea Lorey, Johnette Abraham B, FNP  tamsulosin (FLOMAX) 0.4 MG CAPS capsule Take by mouth.    [provider]  traMADol (ULTRAM) 50 MG tablet Take 1 tablet (50 mg total) by mouth  every 6 (six) hours as needed. 09/08/17   Lamond Glantz, Johnette Abraham B, FNP  vitamin B-12 (CYANOCOBALAMIN) 1000 MCG tablet Take 1,000 mcg by mouth daily.    [provider]    Allergies Patient has no active allergies.  Family History  Problem Relation Age of Onset  . Prostate cancer Neg Hx   . Kidney disease Neg Hx     Social History Social History   Tobacco Use  . Smoking status: Former Smoker    Last attempt to quit: 07/31/1992    Years since quitting: 25.1  . Smokeless tobacco: Never Used  Substance Use Topics  . Alcohol use: No  . Drug use: No    Review of Systems  Constitutional: Negative for fever. Respiratory: Negative for cough or shortness of breath.  Musculoskeletal: Negative for myalgias Skin: Positive for swelling and pain in the left 4th toe. Neurological: Negataive for numbness or paresthesias. ____________________________________________   PHYSICAL EXAM:  VITAL SIGNS: ED Triage Vitals  Enc Vitals Group     BP 09/08/17 1617 (!) 151/84     Pulse Rate 09/08/17 1617 68     Resp 09/08/17 1617 20     Temp 09/08/17 1617 97.6 F (36.4 C)     Temp Source 09/08/17 1617 Oral     SpO2 09/08/17 1617 95 %     Weight 09/08/17 1620 255 lb (115.7 kg)     Height 09/08/17 1620 5\' 9"  (1.753 m)     Head Circumference --      Peak Flow --      Pain Score 09/08/17 1615 0     Pain Loc --      Pain Edu? --      Excl. in [place]? --      Constitutional: Well appearing. Eyes: Conjunctivae are clear without discharge or drainage. Nose: No rhinorrhea noted. Mouth/Throat: Airway is patent.  Neck: No stridor. Unrestricted range of motion observed.  Cardiovascular: Capillary refill is <3 seconds.  Respiratory: Respirations are even and unlabored.. Musculoskeletal: Unrestricted range of motion observed. Neurologic: Awake, alert, and oriented x 4.  Skin:  Hyperkeratotic area over the DIP of the left 4th toe with diffuse edema and  erythema.  ____________________________________________   LABS (all labs ordered are listed, but only abnormal results are displayed)  Labs Reviewed - No data to display ____________________________________________  EKG  Not indicated. ____________________________________________  RADIOLOGY  Not indicated. ____________________________________________   PROCEDURES  Procedures ____________________________________________   INITIAL IMPRESSION / ASSESSMENT AND PLAN / ED COURSE  Thomas Mcdaniel is a 72 y.o. male who presents to the emergency department for evaluation and treatment of left 4th toe pain. Symptoms and exam are consistent with cellulitis. He will be given bactrim and tramadol and advised to keep his foot elevated as much as possible and follow up  with Dr. Cleda Mccreedy in podiatry.   He was advised to return to the ER for symptoms that change or worsen or for new concerns if unable to see the PCP or podiatrist.  Medications  bacitracin ointment 1 application (not administered)     Pertinent labs & imaging results that were available during my care of the patient were reviewed by me and considered in my medical decision making (see chart for details). ____________________________________________   FINAL CLINICAL IMPRESSION(S) / ED DIAGNOSES  Final diagnoses:  Cellulitis of fourth toe of left foot    ED Discharge Orders        Ordered    sulfamethoxazole-trimethoprim (BACTRIM DS,SEPTRA DS) 800-160 MG tablet  2 times daily     09/08/17 1718    traMADol (ULTRAM) 50 MG tablet  Every 6 hours PRN     09/08/17 1718       Note:  This document was prepared using Dragon voice recognition software and may include unintentional dictation errors.    Victorino Dike, FNP 09/08/17 1721    Merlyn Lot, MD 09/08/17 [phone]

## 2017-09-08 NOTE — ED Triage Notes (Signed)
Pt states he has corn on left foot, states it has flared up today and is more painful today.  Pt states the corn has been there about 2 weeks. Pt ambulates with a cane, and declined wheelchair.  Pt states he did not use any OTC products that his PCP recommended. Pt states he has tried to keep it wrapped. Pt states the corn hurts and stings.

## 2017-10-22 DIAGNOSIS — M7031 Other bursitis of elbow, right elbow: Secondary | ICD-10-CM | POA: Insufficient documentation

## 2019-03-18 ENCOUNTER — Other Ambulatory Visit: Payer: Self-pay

## 2019-03-18 ENCOUNTER — Encounter: Payer: Self-pay | Admitting: Emergency Medicine

## 2019-03-18 ENCOUNTER — Emergency Department: Payer: Medicare Other

## 2019-03-18 ENCOUNTER — Emergency Department
Admission: EM | Admit: 2019-03-18 | Discharge: 2019-03-18 | Disposition: A | Discharge status: Home / Self Care | Payer: Medicare Other | Attending: Student | Admitting: Student

## 2019-03-18 DIAGNOSIS — S93491A Sprain of other ligament of right ankle, initial encounter: Secondary | ICD-10-CM | POA: Diagnosis not present

## 2019-03-18 DIAGNOSIS — Z79899 Other long term (current) drug therapy: Secondary | ICD-10-CM | POA: Insufficient documentation

## 2019-03-18 DIAGNOSIS — Z7982 Long term (current) use of aspirin: Secondary | ICD-10-CM | POA: Diagnosis not present

## 2019-03-18 DIAGNOSIS — Y999 Unspecified external cause status: Secondary | ICD-10-CM | POA: Diagnosis not present

## 2019-03-18 DIAGNOSIS — I10 Essential (primary) hypertension: Secondary | ICD-10-CM | POA: Insufficient documentation

## 2019-03-18 DIAGNOSIS — Y9279 Other farm location as the place of occurrence of the external cause: Secondary | ICD-10-CM | POA: Insufficient documentation

## 2019-03-18 DIAGNOSIS — S99911A Unspecified injury of right ankle, initial encounter: Secondary | ICD-10-CM | POA: Diagnosis present

## 2019-03-18 DIAGNOSIS — Z8673 Personal history of transient ischemic attack (TIA), and cerebral infarction without residual deficits: Secondary | ICD-10-CM | POA: Insufficient documentation

## 2019-03-18 DIAGNOSIS — Z7901 Long term (current) use of anticoagulants: Secondary | ICD-10-CM | POA: Diagnosis not present

## 2019-03-18 DIAGNOSIS — Y939 Activity, unspecified: Secondary | ICD-10-CM | POA: Diagnosis not present

## 2019-03-18 DIAGNOSIS — Z87891 Personal history of nicotine dependence: Secondary | ICD-10-CM | POA: Insufficient documentation

## 2019-03-18 DIAGNOSIS — W231XXA Caught, crushed, jammed, or pinched between stationary objects, initial encounter: Secondary | ICD-10-CM | POA: Diagnosis not present

## 2019-03-18 MED ORDER — CELECOXIB 100 MG PO CAPS: 100.0000 mg | ORAL_CAPSULE | Freq: Two times a day (BID) | ORAL | 2 refills | Status: AC

## 2019-03-18 NOTE — ED Notes (Signed)
Pt wheeled out to lobby.  

## 2019-03-18 NOTE — ED Notes (Signed)
Pt refused crutches as he states he already owns a pair.

## 2019-03-18 NOTE — ED Triage Notes (Signed)
PT arrives post mechanical injury with pain to his right ankle.

## 2019-03-18 NOTE — Discharge Instructions (Signed)
Take the Celebrex twice daily as needed for pain and inflammation. Apply ice for 15 minutes for every hour sitting. Please keep ankle elevated. Return to the emergency department with worsening numbness or tingling of the right lower extremity or significant pain of the calf.

## 2019-03-18 NOTE — ED Notes (Signed)
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 Lateral aspect of R foot swollen. Pt unsure if he rolled it today but states he hit it an injured it. Pulse 2+ on both feet. Pt able to point toes downward but states bc of "drop foot" problem he cannot normally point toes upward or move foot side to side. Foot warm; appropriate color.

## 2019-03-18 NOTE — ED Notes (Signed)
Pt educated about ace wrap x2 and crutches. Pt states the compression feels good post application. Cap refill <3, foot warm, color appropriate.

## 2019-03-18 NOTE — ED Provider Notes (Signed)
[facility] Emergency Department Provider Note  ____________________________________________  Time seen: Approximately 6:34 PM  I have reviewed the triage vital signs and the nursing notes.   HISTORY  Chief Complaint Ankle Pain    HPI Thomas Mcdaniel is a 73 y.o. male presents to the emergency department with right ankle pain.  Patient states that his right foot was caught in a part on his tractor.  Patient states that it was near the pedal.  Patient states that he has been unable to bear weight since injury occurred.  He has noticed swelling along the lateral malleolus.  He denies pain in the calf.  Patient states that he has absolutely no pain when he is sitting.  He states he has some numbness around the lateral malleolus but no tingling or sense of weakness.  He did not hit his head or his neck during injury.  Denies chest pain, chest tightness or abdominal pain.  No other alleviating measures have been attempted.        Past Medical History:  Diagnosis Date  . BPH (benign prostatic hyperplasia)   . Complication of anesthesia    difficult to wake up after a lithotripsy  . DJD (degenerative joint disease)   . GERD (gastroesophageal reflux disease)   . Glucose intolerance (impaired glucose tolerance)   . Gout   . Hard of hearing   . Hemorrhoids   . Hyperlipidemia   . Hypertension   . Lumbar spinal stenosis   . Nephrolithiasis   . Obesity   . Personal history of PE (pulmonary embolism)   . PONV (postoperative nausea and vomiting)    after cataract surgery  . Scoliosis     Patient Active Problem List   Diagnosis Date Noted  . Altered mental status 12/14/2015  . Altered mental state 12/14/2015  . 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 CATARACT EXTRACTION    . COLONOSCOPY WITH PROPOFOL N/A 08/01/2015   Procedure: COLONOSCOPY WITH PROPOFOL;  Surgeon: Josefine Class, MD;  Location:  [facility] ENDOSCOPY;  Service: Endoscopy;  Laterality: N/A;  . FEMORAL ARTERY REPAIR    . HERNIA REPAIR    . KNEE ARTHROPLASTY Right 12/05/2015   Procedure: COMPUTER ASSISTED TOTAL KNEE ARTHROPLASTY;  Surgeon: Dereck Leep, MD;  Location: [facility] ORS;  Service: Orthopedics;  Laterality: Right;  . knee arthroscopy    . LAMINECTOMY    . LITHOTRIPSY    . LITHOTRIPSY    . SPINAL FUSION    . TOE AMPUTATION    . TONSILLECTOMY      Prior to Admission medications   Medication Sig Start Date End Date Taking? Authorizing Provider  amLODipine (NORVASC) 10 MG tablet Take 10 mg by mouth daily.    [provider]  aspirin EC 325 MG tablet Take 1 tablet (325 mg total) by mouth daily. Patient not taking: Reported on 04/13/2016 12/16/15   Hillary Bow, MD  celecoxib (CELEBREX) 100 MG capsule Take 1 capsule (100 mg total) by mouth 2 (two) times daily for 14 days. 03/18/19 04/01/19  Lannie Fields, PA-C  ciprofloxacin (CIPRO) 500 MG tablet Take 1 tablet (500 mg total) by mouth 2 (two) times daily. Patient not taking: Reported on 04/13/2016 12/16/15   Hillary Bow, MD  clindamycin (CLEOCIN) 300 MG capsule Take 300 mg by mouth 3 (three) times daily.    [provider]  colchicine 0.6 MG tablet Take 0.6 mg by mouth  2 (two) times daily as needed.     [provider]  enoxaparin (LOVENOX) 40 MG/0.4ML injection Inject 0.4 mLs (40 mg total) into the skin daily. Patient not taking: Reported on 04/13/2016 12/06/15   Watt Climes, PA  famotidine (PEPCID) 40 MG tablet Take 40 mg by mouth daily as needed.     [provider]  lisinopril (PRINIVIL,ZESTRIL) 40 MG tablet Take 40 mg by mouth daily.    [provider]  metFORMIN (GLUCOPHAGE-XR) 500 MG 24 hr tablet Take by mouth. 04/11/16 04/11/17  [provider]  omeprazole (PRILOSEC) 40 MG capsule Take by mouth.    [provider]  oxyCODONE-acetaminophen (ROXICET) 5-325 MG tablet Take 1 tablet by mouth every 4  (four) hours as needed for severe pain. 05/25/16   Paulette Blanch, MD  pravastatin (PRAVACHOL) 10 MG tablet Take by mouth. 04/10/16 04/10/17  [provider]  sulfamethoxazole-trimethoprim (BACTRIM DS,SEPTRA DS) 800-160 MG tablet Take 1 tablet by mouth 2 (two) times daily. 09/08/17   Triplett, Johnette Abraham B, FNP  tamsulosin (FLOMAX) 0.4 MG CAPS capsule Take by mouth.    [provider]  traMADol (ULTRAM) 50 MG tablet Take 1 tablet (50 mg total) by mouth every 6 (six) hours as needed. 09/08/17   Triplett, Johnette Abraham B, FNP  vitamin B-12 (CYANOCOBALAMIN) 1000 MCG tablet Take 1,000 mcg by mouth daily.    [provider]    Allergies Patient has no active allergies.  Family History  Problem Relation Age of Onset  . Prostate cancer Neg Hx   . Kidney disease Neg Hx     Social History Social History   Tobacco Use  . Smoking status: Former Smoker    Quit date: 07/31/1992    Years since quitting: 26.6  . Smokeless tobacco: Never Used  Substance Use Topics  . Alcohol use: No  . Drug use: No     Review of Systems  Constitutional: No fever/chills Eyes: No visual changes. No discharge ENT: No upper respiratory complaints. Cardiovascular: no chest pain. Respiratory: no cough. No SOB. Gastrointestinal: No abdominal pain.  No nausea, no vomiting.  No diarrhea.  No constipation. Genitourinary: Negative for dysuria. No hematuria Musculoskeletal: Patient has right ankle pain.  Skin: Negative for rash, abrasions, lacerations, ecchymosis. Neurological: Negative for headaches, focal weakness or numbness.  ____________________________________________   PHYSICAL EXAM:  VITAL SIGNS: ED Triage Vitals  Enc Vitals Group     BP 03/18/19 1529 (!) 153/107     Pulse Rate 03/18/19 1529 77     Resp 03/18/19 1529 18     Temp 03/18/19 1529 98 F (36.7 C)     Temp Source 03/18/19 1529 Oral     SpO2 03/18/19 1529 98 %     Weight 03/18/19 1530 255 lb 11.7 oz (116 kg)     Height 03/18/19  1530 5\' 10"  (1.778 m)     Head Circumference --      Peak Flow --      Pain Score 03/18/19 1529 8     Pain Loc --      Pain Edu? --      Excl. in [place]? --      Constitutional: Alert and oriented. Well appearing and in no acute distress. Eyes: Conjunctivae are normal. PERRL. EOMI. Head: Atraumatic. Cardiovascular: Normal rate, regular rhythm. Normal S1 and S2.  Good peripheral circulation. Respiratory: Normal respiratory effort without tachypnea or retractions. Lungs CTAB. Good air entry to the bases with no decreased or  absent breath sounds. Gastrointestinal: Bowel sounds 4 quadrants. Soft and nontender to palpation. No guarding or rigidity. No palpable masses. No distention. No CVA tenderness. Musculoskeletal: Patient is unable to perform full range of motion at the right ankle.  He performs full range of motion at the right knee and right hip.  Patient can move all 5 right toes.  He has no pain to palpation along the right calf.  Palpable dorsalis pedis pulse bilaterally and symmetrically. Neurologic:  Normal speech and language. No gross focal neurologic deficits are appreciated.  Skin:  Skin is warm, dry and intact. No rash noted. Psychiatric: Mood and affect are normal. Speech and behavior are normal. Patient exhibits appropriate insight and judgement.   ____________________________________________   LABS (all labs ordered are listed, but only abnormal results are displayed)  Labs Reviewed - No data to display ____________________________________________  EKG   ____________________________________________  RADIOLOGY I personally viewed and evaluated these images as part of my medical decision making, as well as reviewing the written report by the radiologist.  Dg Ankle Complete Right  Result Date: 03/18/2019 CLINICAL DATA:  PT arrives post mechanical injury with pain to his right ankle from falling off tractor EXAM: RIGHT ANKLE - COMPLETE 3+ VIEW COMPARISON:  None.  FINDINGS: Small calcific fragments project medial to the talus. There is no evidence of dislocation, or joint effusion. Ankle mortise intact. Calcaneal spurs. Patchy calcifications in the posterior aspect of the plantar aponeurosis. There is no evidence of arthropathy or other focal bone abnormality. Lateral soft tissue swelling. IMPRESSION: 1. Possible small avulsion fragments medial to the talus. Correlate with point tenderness. 2. Lateral soft tissue swelling without fracture suggesting ligamentous injury. Electronically Signed   By: Lucrezia Europe M.D.   On: 03/18/2019 16:14    ____________________________________________    PROCEDURES  Procedure(s) performed:    Procedures    Medications - No data to display   ____________________________________________   INITIAL IMPRESSION / ASSESSMENT AND PLAN / ED COURSE  Pertinent labs & imaging results that were available during my care of the patient were reviewed by me and considered in my medical decision making (see chart for details).  Review of the [place] CSRS was performed in accordance of the [place] prior to dispensing any controlled drugs.           Assessment and plan:  Right Ankle Pain:  73 year old male presents to the emergency department with right lateral ankle pain.  Patient had limited range of motion at the right ankle, likely secondary to pain.  X-ray examination revealed some avulsions along the medial malleolus and patient had no correlated tenderness in affected area.  Patient did have swelling along lateral malleolus and tenderness to palpation.  Capillary refill of all 5 right toes were less than 2 seconds and foot was warm to the touch.  Patient's pain was well controlled in the ED.  An Ace wrap was applied and crutches were provided.  Patient was started on Celebrex and advised to follow-up with his podiatrist, Dr. Cleda Mccreedy.      ____________________________________________  FINAL CLINICAL IMPRESSION(S) / ED  DIAGNOSES  Final diagnoses:  Sprain of anterior talofibular ligament of right ankle, initial encounter      NEW MEDICATIONS STARTED DURING THIS VISIT:  ED Discharge Orders         Ordered    celecoxib (CELEBREX) 100 MG capsule  2 times daily     03/18/19 1632  This chart was dictated using voice recognition software/Dragon. Despite best efforts to proofread, errors can occur which can change the meaning. Any change was purely unintentional.    Karren Cobble 03/18/19 2031    Lilia Pro., MD 03/19/19 [phone]

## 2019-08-19 ENCOUNTER — Other Ambulatory Visit: Payer: Self-pay

## 2019-08-19 ENCOUNTER — Encounter: Payer: Self-pay | Admitting: Ophthalmology

## 2019-08-21 ENCOUNTER — Other Ambulatory Visit
Admission: RE | Admit: 2019-08-21 | Discharge: 2019-08-21 | Disposition: A | Discharge status: Home / Self Care | Payer: Medicare Other | Source: Ambulatory Visit | Attending: Ophthalmology | Admitting: Ophthalmology

## 2019-08-21 ENCOUNTER — Other Ambulatory Visit: Payer: Self-pay

## 2019-08-21 DIAGNOSIS — Z01812 Encounter for preprocedural laboratory examination: Secondary | ICD-10-CM | POA: Diagnosis present

## 2019-08-21 DIAGNOSIS — Z20822 Contact with and (suspected) exposure to covid-19: Secondary | ICD-10-CM | POA: Insufficient documentation

## 2019-08-21 LAB — SARS CORONAVIRUS 2 (TAT 6-24 HRS): SARS Coronavirus 2: NEGATIVE

## 2019-08-21 NOTE — Discharge Instructions (Signed)

## 2019-08-25 ENCOUNTER — Ambulatory Visit: Payer: Medicare Other | Admitting: Anesthesiology

## 2019-08-25 ENCOUNTER — Other Ambulatory Visit: Payer: Self-pay

## 2019-08-25 ENCOUNTER — Encounter
Admission: RE | Disposition: A | Discharge status: Home / Self Care | Payer: Self-pay | Source: Home / Self Care | Attending: Ophthalmology

## 2019-08-25 ENCOUNTER — Encounter: Payer: Self-pay | Admitting: Ophthalmology

## 2019-08-25 ENCOUNTER — Ambulatory Visit
Admission: RE | Admit: 2019-08-25 | Discharge: 2019-08-25 | Disposition: A | Discharge status: Home / Self Care | Payer: Medicare Other | Attending: Ophthalmology | Admitting: Ophthalmology

## 2019-08-25 DIAGNOSIS — I10 Essential (primary) hypertension: Secondary | ICD-10-CM | POA: Insufficient documentation

## 2019-08-25 DIAGNOSIS — Z96659 Presence of unspecified artificial knee joint: Secondary | ICD-10-CM | POA: Diagnosis not present

## 2019-08-25 DIAGNOSIS — H2511 Age-related nuclear cataract, right eye: Secondary | ICD-10-CM | POA: Insufficient documentation

## 2019-08-25 DIAGNOSIS — N400 Enlarged prostate without lower urinary tract symptoms: Secondary | ICD-10-CM | POA: Diagnosis not present

## 2019-08-25 DIAGNOSIS — Z79899 Other long term (current) drug therapy: Secondary | ICD-10-CM | POA: Insufficient documentation

## 2019-08-25 DIAGNOSIS — Z9849 Cataract extraction status, unspecified eye: Secondary | ICD-10-CM | POA: Insufficient documentation

## 2019-08-25 DIAGNOSIS — M109 Gout, unspecified: Secondary | ICD-10-CM | POA: Diagnosis not present

## 2019-08-25 DIAGNOSIS — Z87891 Personal history of nicotine dependence: Secondary | ICD-10-CM | POA: Insufficient documentation

## 2019-08-25 HISTORY — PX: CATARACT EXTRACTION W/PHACO: SHX586

## 2019-08-25 SURGERY — PHACOEMULSIFICATION, CATARACT, WITH IOL INSERTION
Anesthesia: Monitor Anesthesia Care | Site: Eye | Laterality: Right

## 2019-08-25 MED ORDER — MOXIFLOXACIN HCL 0.5 % OP SOLN
OPHTHALMIC | Status: DC | PRN
  Administered 2019-08-25: 0.2 mL via OPHTHALMIC

## 2019-08-25 MED ORDER — LIDOCAINE HCL (PF) 2 % IJ SOLN
INTRAOCULAR | Status: DC | PRN
  Administered 2019-08-25: 1 mL

## 2019-08-25 MED ORDER — NA CHONDROIT SULF-NA HYALURON 40-17 MG/ML IO SOLN
INTRAOCULAR | Status: DC | PRN
  Administered 2019-08-25: 1 mL via INTRAOCULAR

## 2019-08-25 MED ORDER — FENTANYL CITRATE (PF) 100 MCG/2ML IJ SOLN
INTRAMUSCULAR | Status: DC | PRN
  Administered 2019-08-25: 100 ug via INTRAVENOUS

## 2019-08-25 MED ORDER — TETRACAINE HCL 0.5 % OP SOLN
1.0000 [drp] | OPHTHALMIC | Status: DC | PRN
  Administered 2019-08-25 (×3): 1 [drp] via OPHTHALMIC

## 2019-08-25 MED ORDER — LACTATED RINGERS IV SOLN: INTRAVENOUS | Status: DC

## 2019-08-25 MED ORDER — ARMC OPHTHALMIC DILATING DROPS
1.0000 | OPHTHALMIC | Status: DC | PRN
Start: 2019-08-25 — End: 2019-08-25
  Administered 2019-08-25 (×3): 1 via OPHTHALMIC

## 2019-08-25 MED ORDER — BRIMONIDINE TARTRATE-TIMOLOL 0.2-0.5 % OP SOLN
OPHTHALMIC | Status: DC | PRN
  Administered 2019-08-25: 1 [drp] via OPHTHALMIC

## 2019-08-25 MED ORDER — EPINEPHRINE PF 1 MG/ML IJ SOLN
INTRAOCULAR | Status: DC | PRN
  Administered 2019-08-25: 63 mL via OPHTHALMIC

## 2019-08-25 MED ORDER — MIDAZOLAM HCL 2 MG/2ML IJ SOLN
INTRAMUSCULAR | Status: DC | PRN
  Administered 2019-08-25: 2 mg via INTRAVENOUS

## 2019-08-25 SURGICAL SUPPLY — 21 items
CANNULA ANT/CHMB 27G (MISCELLANEOUS) ×2 IMPLANT
CANNULA ANT/CHMB 27GA (MISCELLANEOUS) ×4 IMPLANT
GLOVE SURG LX 8.0 MICRO (GLOVE) ×1
GLOVE SURG LX STRL 8.0 MICRO (GLOVE) ×1 IMPLANT
GLOVE SURG TRIUMPH 8.0 PF LTX (GLOVE) ×2 IMPLANT
GOWN STRL REUS W/ TWL LRG LVL3 (GOWN DISPOSABLE) ×2 IMPLANT
GOWN STRL REUS W/TWL LRG LVL3 (GOWN DISPOSABLE) ×2
LENS IOL TECNIS ITEC 20.5 (Intraocular Lens) ×1 IMPLANT
MARKER SKIN DUAL TIP RULER LAB (MISCELLANEOUS) ×2 IMPLANT
NDL FILTER BLUNT 18X1 1/2 (NEEDLE) ×1 IMPLANT
NDL RETROBULBAR .5 NSTRL (NEEDLE) ×2 IMPLANT
NEEDLE FILTER BLUNT 18X 1/2SAF (NEEDLE) ×1
NEEDLE FILTER BLUNT 18X1 1/2 (NEEDLE) ×1 IMPLANT
PACK EYE AFTER SURG (MISCELLANEOUS) ×2 IMPLANT
PACK OPTHALMIC (MISCELLANEOUS) ×2 IMPLANT
PACK PORFILIO (MISCELLANEOUS) ×2 IMPLANT
RING MALYGIN (MISCELLANEOUS) ×1 IMPLANT
SYR 3ML LL SCALE MARK (SYRINGE) ×2 IMPLANT
SYR TB 1ML LUER SLIP (SYRINGE) ×2 IMPLANT
WATER STERILE IRR 250ML POUR (IV SOLUTION) ×2 IMPLANT
WIPE NON LINTING 3.25X3.25 (MISCELLANEOUS) ×2 IMPLANT

## 2019-08-25 NOTE — Anesthesia Postprocedure Evaluation (Signed)
Anesthesia Post Note  Patient: Thomas Mcdaniel  Procedure(s) Performed: CATARACT EXTRACTION PHACO AND INTRAOCULAR LENS PLACEMENT (IOC) MALYUGIN RIGHT 7.94  00:48.5 (Right Eye)     Patient location during evaluation: PACU Anesthesia Type: MAC Level of consciousness: awake Pain management: pain level controlled Vital Signs Assessment: post-procedure vital signs reviewed and stable Respiratory status: respiratory function stable Cardiovascular status: stable Postop Assessment: no apparent nausea or vomiting Anesthetic complications: no    Veda Canning

## 2019-08-25 NOTE — Op Note (Addendum)
PREOPERATIVE DIAGNOSIS:  Nuclear sclerotic cataract of the right eye.   POSTOPERATIVE DIAGNOSIS:  H25.11 Cataract   OPERATIVE PROCEDURE:@   SURGEON:  Birder Robson, MD.   ANESTHESIA:  Anesthesiologist: Veda Canning, MD CRNA: Silvana Newness, CRNA  1.      Managed anesthesia care. 2.      0.59ml of Shugarcaine was instilled in the eye following the paracentesis.   COMPLICATIONS:  Viscoelastic was used to raise the pupil margin.  A  Malyugin ring was placed as the pupil would not achieve sufficient pharmacologic dilation to undergo cataract extraction safely.( The ring was removed atraumatically following insertion of the IOL.)    TECHNIQUE:   Stop and chop   DESCRIPTION OF PROCEDURE:  The patient was examined and consented in the preoperative holding area where the aforementioned topical anesthesia was applied to the right eye and then brought back to the Operating Room where the right eye was prepped and draped in the usual sterile ophthalmic fashion and a lid speculum was placed. A paracentesis was created with the side port blade and the anterior chamber was filled with viscoelastic. A near clear corneal incision was performed with the steel keratome. A continuous curvilinear capsulorrhexis was performed with a cystotome followed by the capsulorrhexis forceps. Hydrodissection and hydrodelineation were carried out with BSS on a blunt cannula. The lens was removed in a stop and chop  technique and the remaining cortical material was removed with the irrigation-aspiration handpiece. The capsular bag was inflated with viscoelastic and the Technis [id number]  lens was placed in the capsular bag without complication. The remaining viscoelastic was removed from the eye with the irrigation-aspiration handpiece. The wounds were hydrated. The anterior chamber was flushed with BSS and the eye was inflated to physiologic pressure. 0.8ml of Vigamox was placed in the anterior chamber. The wounds were found to  be water tight. The eye was dressed with Combigan. The patient was given protective glasses to wear throughout the day and a shield with which to sleep tonight. The patient was also given drops with which to begin a drop regimen today and will follow-up with me in one day. Implant Name Type Inv. Item Serial No. Manufacturer Lot No. LRB No. Used Action  LENS IOL DIOP 20.5 - [id number] Intraocular Lens LENS IOL DIOP 20.5 [id number]  Right 1 Implanted   Procedure(s): CATARACT EXTRACTION PHACO AND INTRAOCULAR LENS PLACEMENT (IOC) MALYUGIN RIGHT 7.94  00:48.5 (Right)  Electronically signed: Birder Robson 08/25/2019 11:01 AM

## 2019-08-25 NOTE — Anesthesia Preprocedure Evaluation (Signed)
Anesthesia Evaluation  Patient identified by MRN, date of birth, ID band Patient awake    Reviewed: Allergy & Precautions, NPO status   History of Anesthesia Complications (+) PONV  Airway Mallampati: II  TM Distance: >3 FB     Dental   Pulmonary former smoker,    breath sounds clear to auscultation       Cardiovascular hypertension,  Rhythm:Regular Rate:Normal     Neuro/Psych    GI/Hepatic GERD  ,  Endo/Other  Obesity - BMI 37  Renal/GU      Musculoskeletal  (+) Arthritis ,   Abdominal   Peds  Hematology   Anesthesia Other Findings Hx PE BPH Gout  Reproductive/Obstetrics                             Anesthesia Physical Anesthesia Plan  ASA: III  Anesthesia Plan: MAC   Post-op Pain Management:    Induction: Intravenous  PONV Risk Score and Plan: TIVA, Midazolam and Treatment may vary due to age or medical condition  Airway Management Planned: Natural Airway and Nasal Cannula  Additional Equipment:   Intra-op Plan:   Post-operative Plan:   Informed Consent: I have reviewed the patients History and Physical, chart, labs and discussed the procedure including the risks, benefits and alternatives for the proposed anesthesia with the patient or authorized representative who has indicated his/her understanding and acceptance.       Plan Discussed with: CRNA  Anesthesia Plan Comments:         Anesthesia Quick Evaluation

## 2019-08-25 NOTE — Transfer of Care (Signed)
Immediate Anesthesia Transfer of Care Note  Patient: Thomas Mcdaniel  Procedure(s) Performed: CATARACT EXTRACTION PHACO AND INTRAOCULAR LENS PLACEMENT (IOC) MALYUGIN RIGHT 7.94  00:48.5 (Right Eye)  Patient Location: PACU  Anesthesia Type: MAC  Level of Consciousness: awake, alert  and patient cooperative  Airway and Oxygen Therapy: Patient Spontanous Breathing and Patient connected to supplemental oxygen  Post-op Assessment: Post-op Vital signs reviewed, Patient's Cardiovascular Status Stable, Respiratory Function Stable, Patent Airway and No signs of Nausea or vomiting  Post-op Vital Signs: Reviewed and stable  Complications: No apparent anesthesia complications

## 2019-08-25 NOTE — H&P (Signed)
All labs reviewed. Abnormal studies sent to patients PCP when indicated.  Previous H&P reviewed, patient examined, there are NO CHANGES.  Thomas Mcdaniel Porfilio3/2/202110:26 AM

## 2019-09-23 ENCOUNTER — Other Ambulatory Visit: Payer: Self-pay

## 2019-09-23 ENCOUNTER — Other Ambulatory Visit
Admission: RE | Admit: 2019-09-23 | Discharge: 2019-09-23 | Disposition: A | Discharge status: Home / Self Care | Payer: Medicare Other | Source: Ambulatory Visit | Attending: General Surgery | Admitting: General Surgery

## 2019-09-23 DIAGNOSIS — Z01812 Encounter for preprocedural laboratory examination: Secondary | ICD-10-CM | POA: Insufficient documentation

## 2019-09-23 DIAGNOSIS — Z20822 Contact with and (suspected) exposure to covid-19: Secondary | ICD-10-CM | POA: Diagnosis not present

## 2019-09-23 LAB — SARS CORONAVIRUS 2 (TAT 6-24 HRS): SARS Coronavirus 2: NEGATIVE

## 2019-09-24 ENCOUNTER — Encounter: Payer: Self-pay | Admitting: General Surgery

## 2019-09-25 ENCOUNTER — Encounter
Admission: RE | Disposition: A | Discharge status: Home / Self Care | Payer: Self-pay | Source: Home / Self Care | Attending: General Surgery

## 2019-09-25 ENCOUNTER — Encounter: Payer: Self-pay | Admitting: General Surgery

## 2019-09-25 ENCOUNTER — Ambulatory Visit: Payer: Medicare Other | Admitting: Anesthesiology

## 2019-09-25 ENCOUNTER — Ambulatory Visit
Admission: RE | Admit: 2019-09-25 | Discharge: 2019-09-25 | Disposition: A | Discharge status: Home / Self Care | Payer: Medicare Other | Attending: General Surgery | Admitting: General Surgery

## 2019-09-25 DIAGNOSIS — M109 Gout, unspecified: Secondary | ICD-10-CM | POA: Diagnosis not present

## 2019-09-25 DIAGNOSIS — D123 Benign neoplasm of transverse colon: Secondary | ICD-10-CM | POA: Insufficient documentation

## 2019-09-25 DIAGNOSIS — Z6837 Body mass index (BMI) 37.0-37.9, adult: Secondary | ICD-10-CM | POA: Diagnosis not present

## 2019-09-25 DIAGNOSIS — Z79899 Other long term (current) drug therapy: Secondary | ICD-10-CM | POA: Diagnosis not present

## 2019-09-25 DIAGNOSIS — K219 Gastro-esophageal reflux disease without esophagitis: Secondary | ICD-10-CM | POA: Diagnosis not present

## 2019-09-25 DIAGNOSIS — Z96651 Presence of right artificial knee joint: Secondary | ICD-10-CM | POA: Diagnosis not present

## 2019-09-25 DIAGNOSIS — Z89429 Acquired absence of other toe(s), unspecified side: Secondary | ICD-10-CM | POA: Diagnosis not present

## 2019-09-25 DIAGNOSIS — R194 Change in bowel habit: Secondary | ICD-10-CM | POA: Insufficient documentation

## 2019-09-25 DIAGNOSIS — N400 Enlarged prostate without lower urinary tract symptoms: Secondary | ICD-10-CM | POA: Insufficient documentation

## 2019-09-25 DIAGNOSIS — I10 Essential (primary) hypertension: Secondary | ICD-10-CM | POA: Insufficient documentation

## 2019-09-25 DIAGNOSIS — E669 Obesity, unspecified: Secondary | ICD-10-CM | POA: Insufficient documentation

## 2019-09-25 DIAGNOSIS — K5730 Diverticulosis of large intestine without perforation or abscess without bleeding: Secondary | ICD-10-CM | POA: Insufficient documentation

## 2019-09-25 DIAGNOSIS — Z87891 Personal history of nicotine dependence: Secondary | ICD-10-CM | POA: Diagnosis not present

## 2019-09-25 DIAGNOSIS — Z86711 Personal history of pulmonary embolism: Secondary | ICD-10-CM | POA: Insufficient documentation

## 2019-09-25 DIAGNOSIS — K648 Other hemorrhoids: Secondary | ICD-10-CM | POA: Insufficient documentation

## 2019-09-25 DIAGNOSIS — G4730 Sleep apnea, unspecified: Secondary | ICD-10-CM | POA: Insufficient documentation

## 2019-09-25 HISTORY — DX: Pain in unspecified joint: M25.50

## 2019-09-25 HISTORY — DX: Sleep apnea, unspecified: G47.30

## 2019-09-25 HISTORY — DX: Neoplasm of uncertain behavior of unspecified kidney: D41.00

## 2019-09-25 HISTORY — DX: Spinal stenosis, lumbar region without neurogenic claudication: M48.061

## 2019-09-25 HISTORY — DX: Intervertebral disc disorders with radiculopathy, lumbar region: M51.16

## 2019-09-25 HISTORY — DX: Shortness of breath: R06.02

## 2019-09-25 HISTORY — DX: Frequency of micturition: R35.0

## 2019-09-25 HISTORY — PX: COLONOSCOPY WITH PROPOFOL: SHX5780

## 2019-09-25 HISTORY — DX: Burn of unspecified body region, unspecified degree: T30.0

## 2019-09-25 HISTORY — DX: Polyneuropathy, unspecified: G62.9

## 2019-09-25 HISTORY — DX: Neoplasm of uncertain behavior of unspecified ureter: D41.20

## 2019-09-25 HISTORY — DX: Foot drop, unspecified foot: M21.379

## 2019-09-25 HISTORY — DX: Prediabetes: R73.03

## 2019-09-25 HISTORY — DX: Spondylolisthesis, site unspecified: M43.10

## 2019-09-25 HISTORY — DX: Bradycardia, unspecified: R00.1

## 2019-09-25 HISTORY — DX: Bursopathy, unspecified: M71.9

## 2019-09-25 HISTORY — DX: Unspecified infection of urinary tract in pregnancy, unspecified trimester: O23.40

## 2019-09-25 SURGERY — COLONOSCOPY WITH PROPOFOL
Anesthesia: General

## 2019-09-25 MED ORDER — ONDANSETRON HCL 4 MG/2ML IJ SOLN
INTRAMUSCULAR | Status: AC
  Filled 2019-09-25: qty 2

## 2019-09-25 MED ORDER — EPHEDRINE SULFATE 50 MG/ML IJ SOLN
INTRAMUSCULAR | Status: DC | PRN
  Administered 2019-09-25 (×3): 5 mg via INTRAVENOUS

## 2019-09-25 MED ORDER — EPHEDRINE 5 MG/ML INJ
INTRAVENOUS | Status: AC
  Filled 2019-09-25: qty 4

## 2019-09-25 MED ORDER — FENTANYL CITRATE (PF) 100 MCG/2ML IJ SOLN
INTRAMUSCULAR | Status: DC | PRN
  Administered 2019-09-25: 50 ug via INTRAVENOUS

## 2019-09-25 MED ORDER — MIDAZOLAM HCL 2 MG/2ML IJ SOLN
INTRAMUSCULAR | Status: AC
  Filled 2019-09-25: qty 2

## 2019-09-25 MED ORDER — SODIUM CHLORIDE 0.9 % IV SOLN
INTRAVENOUS | Status: DC
  Administered 2019-09-25: 1000 mL via INTRAVENOUS

## 2019-09-25 MED ORDER — PHENYLEPHRINE HCL (PRESSORS) 10 MG/ML IV SOLN
INTRAVENOUS | Status: DC | PRN
  Administered 2019-09-25: 50 ug via INTRAVENOUS

## 2019-09-25 MED ORDER — PROPOFOL 500 MG/50ML IV EMUL
INTRAVENOUS | Status: DC | PRN
  Administered 2019-09-25: 120 ug/kg/min via INTRAVENOUS

## 2019-09-25 MED ORDER — FENTANYL CITRATE (PF) 100 MCG/2ML IJ SOLN
INTRAMUSCULAR | Status: AC
  Filled 2019-09-25: qty 2

## 2019-09-25 MED ORDER — DEXAMETHASONE SODIUM PHOSPHATE 10 MG/ML IJ SOLN
INTRAMUSCULAR | Status: DC | PRN
  Administered 2019-09-25: 4 mg via INTRAVENOUS

## 2019-09-25 MED ORDER — MIDAZOLAM HCL 2 MG/2ML IJ SOLN
INTRAMUSCULAR | Status: DC | PRN
  Administered 2019-09-25: 1 mg via INTRAVENOUS

## 2019-09-25 MED ORDER — ONDANSETRON HCL 4 MG/2ML IJ SOLN
INTRAMUSCULAR | Status: DC | PRN
  Administered 2019-09-25: 4 mg via INTRAVENOUS

## 2019-09-25 MED ORDER — DEXAMETHASONE SODIUM PHOSPHATE 4 MG/ML IJ SOLN
INTRAMUSCULAR | Status: AC
  Filled 2019-09-25: qty 1

## 2019-09-25 MED ORDER — PROPOFOL 500 MG/50ML IV EMUL
INTRAVENOUS | Status: AC
  Filled 2019-09-25: qty 50

## 2019-09-25 NOTE — Op Note (Signed)
[facility] Patient Name: Thomas Mcdaniel Procedure Date: 09/25/2019 8:48 AM MRN: [id number] Account #: [id number] Date of Birth: August 28, 1945 Admit Type: Outpatient Age: 74 Room: [facility] ENDO ROOM 1 Gender: Male Note Status: Finalized Procedure:             Colonoscopy Indications:           Change in bowel habits Providers:             Robert Bellow, MD Medicines:             Monitored Anesthesia Care Complications:         No immediate complications. Procedure:             Pre-Anesthesia Assessment:                        - Prior to the procedure, a History and Physical was                         performed, and patient medications, allergies and                         sensitivities were reviewed. The patient's tolerance                         of previous anesthesia was reviewed.                        - The risks and benefits of the procedure and the                         sedation options and risks were discussed with the                         patient. All questions were answered and informed                         consent was obtained.                        After obtaining informed consent, the colonoscope was                         passed under direct vision. Throughout the procedure,                         the patient's blood pressure, pulse, and oxygen                         saturations were monitored continuously. The was                         introduced through the anus and advanced to the the                         terminal ileum. The colonoscopy was performed without                         difficulty. The patient tolerated the procedure well.  The quality of the bowel preparation was good. Findings:      A 6 mm polyp was found in the hepatic flexure. The polyp was sessile.       The polyp was removed with a cold snare. Resection and retrieval were       complete.      Multiple medium-mouthed  diverticula were found in the sigmoid colon and       hepatic flexure.      Internal hemorrhoids were found during retroflexion. The hemorrhoids       were mild. Impression:            - One 6 mm polyp at the hepatic flexure, removed with                         a cold snare. Resected and retrieved.                        - Diverticulosis in the sigmoid colon and at the                         hepatic flexure.                        - Internal hemorrhoids. Recommendation:        - Telephone endoscopist for pathology results in 1                         week. Procedure Code(s):     --- Professional ---                        [phone], Colonoscopy, flexible; with removal of                         tumor(s), polyp(s), or other lesion(s) by snare                         technique Diagnosis Code(s):     --- Professional ---                        K63.5, Polyp of colon                        K64.8, Other hemorrhoids                        R19.4, Change in bowel habit                        K57.30, Diverticulosis of large intestine without                         perforation or abscess without bleeding CPT copyright 2019 American [employer] Association. All rights reserved. The codes documented in this report are preliminary and upon coder review may  be revised to meet current compliance requirements. Robert Bellow, MD 09/25/2019 9:36:19 AM This report has been signed electronically. Number of Addenda: 0 Note Initiated On: 09/25/2019 8:48 AM Scope Withdrawal Time: 0 hours 18 minutes 3 seconds  Total Procedure Duration: 0 hours 30 minutes 12 seconds  Estimated Blood Loss:  Estimated blood loss: none.  [facility]

## 2019-09-25 NOTE — H&P (Signed)
Thomas Mcdaniel [id number] 22-Feb-1946     HPI:  74 year old male with recent change in stool habits.  More constipated than usual. Inconsistent laxative use/ results.  See GI note of September 02, 2019. Tolerated prep well.  Colon exam in 2019 showed hyperplastic polyps.   Medications Prior to Admission  Medication Sig Dispense Refill Last Dose  . acetaminophen (TYLENOL) 325 MG tablet Take 650 mg by mouth every 6 (six) hours as needed.   09/25/2019 at [place]  . allopurinol (ZYLOPRIM) 300 MG tablet Take 300 mg by mouth daily.   09/25/2019 at [place]  . amLODipine (NORVASC) 10 MG tablet Take 10 mg by mouth daily.   09/25/2019 at [place]  . colchicine 0.6 MG tablet Take 0.6 mg by mouth 2 (two) times daily as needed.    09/25/2019 at [place]  . Docusate Calcium (STOOL SOFTENER PO) Take by mouth daily.   Past Week at Unknown time  . famotidine (PEPCID) 40 MG tablet Take 40 mg by mouth daily as needed.    09/25/2019 at [place]  . finasteride (PROSCAR) 5 MG tablet Take 5 mg by mouth daily.   09/25/2019 at [place]  . indomethacin (INDOCIN) 25 MG capsule Take 25 mg by mouth 2 (two) times daily with a meal.   09/25/2019 at 0715  . lisinopril (PRINIVIL,ZESTRIL) 40 MG tablet Take 40 mg by mouth daily.   09/25/2019 at [place]  . montelukast (SINGULAIR) 10 MG tablet Take 10 mg by mouth at bedtime.   09/25/2019 at [place]  . tamsulosin (FLOMAX) 0.4 MG CAPS capsule Take by mouth.   09/25/2019 at [place]  . triamcinolone cream (KENALOG) 0.1 % Apply 1 application topically 2 (two) times daily.   09/25/2019 at 0715   No Known Allergies Past Medical History:  Diagnosis Date  . Acquired spondylolisthesis   . BPH (benign prostatic hyperplasia)   . Bradycardia   . Burn   . Bursitis    right elbow   . Complication of anesthesia    difficult to wake up after a lithotripsy  . DJD (degenerative joint disease)   . Foot drop    bil.  Marland Kitchen GERD (gastroesophageal reflux disease)   . Glucose intolerance (impaired glucose tolerance)   . Gout   . Hard of hearing    . Hemorrhoids   . Hyperlipidemia   . Hypertension   . Intervertebral disc disorders with radiculopathy, lumbar region   . Joint pain   . Lumbar spinal stenosis   . 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 Nephrolithiasis   . Obesity   . Peripheral polyneuropathy   . Personal history of PE (pulmonary embolism)   . PONV (postoperative nausea and vomiting)    after cataract surgery  . Pre-diabetes   . Scoliosis   . Sleep apnea   . SOB (shortness of breath)   . Spinal stenosis, lumbar region without neurogenic claudication   . Urine frequency   . UTI (urinary tract infection) during pregnancy, unspecified trimester    Past Surgical History:  Procedure Laterality Date  . CATARACT EXTRACTION    . CATARACT EXTRACTION W/PHACO Right 08/25/2019   Procedure: CATARACT EXTRACTION PHACO AND INTRAOCULAR LENS PLACEMENT (IOC) MALYUGIN RIGHT 7.94  00:48.5;  Surgeon: Birder Robson, MD;  Location: [place];  Service: Ophthalmology;  Laterality: Right;  . COLONOSCOPY WITH PROPOFOL N/A 08/01/2015   Procedure: COLONOSCOPY WITH PROPOFOL;  Surgeon: Josefine Class, MD;  Location[facility] ENDOSCOPY;  Service: Endoscopy;  Laterality: N/A;  . FEMORAL ARTERY REPAIR    . HERNIA REPAIR    . JOINT REPLACEMENT Right    knee  . KNEE ARTHROPLASTY Right 12/05/2015   Procedure: COMPUTER ASSISTED TOTAL KNEE ARTHROPLASTY;  Surgeon: Dereck Leep, MD;  Location: [facility] ORS;  Service: Orthopedics;  Laterality: Right;  . knee arthroscopy    . LAMINECTOMY    . LITHOTRIPSY    . LITHOTRIPSY    . SPINAL FUSION    . TOE AMPUTATION    . TONSILLECTOMY     Social History   Socioeconomic History  . Marital status: Married    Spouse name: Not on file  . Number of children: Not on file  . Years of education: Not on file  . Highest education level: Not on file  Occupational History  . Not on file  Tobacco Use  . Smoking status: Former Smoker    Quit date: 07/31/1992    Years since quitting:  27.1  . Smokeless tobacco: Never Used  Substance and Sexual Activity  . Alcohol use: No  . Drug use: No  . Sexual activity: Not on file  Other Topics Concern  . Not on file  Social History Narrative  . Not on file   Social Determinants of Health   Financial Resource Strain:   . Difficulty of Paying Living Expenses:   Food Insecurity:   . Worried About Charity fundraiser in the Last Year:   . Arboriculturist in the Last Year:   Transportation Needs:   . Film/video editor (Medical):   Marland Kitchen Lack of Transportation (Non-Medical):   Physical Activity:   . Days of Exercise per Week:   . Minutes of Exercise per Session:   Stress:   . Feeling of Stress :   Social Connections:   . Frequency of Communication with Friends and Family:   . Frequency of Social Gatherings with Friends and Family:   . Attends Religious Services:   . Active Member of Clubs or Organizations:   . Attends Archivist Meetings:   Marland Kitchen Marital Status:   Intimate Partner Violence:   . Fear of Current or Ex-Partner:   . Emotionally Abused:   Marland Kitchen Physically Abused:   . Sexually Abused:    Social History   Social History Narrative  . Not on file     ROS: Negative.     PE: HEENT: Negative. Lungs: Clear. Cardio: RR.  Assessment/Plan:  Proceed with planned endoscopy.   Forest Gleason [facility] 09/25/2019

## 2019-09-25 NOTE — Anesthesia Postprocedure Evaluation (Signed)
Anesthesia Post Note  Patient: Thomas Mcdaniel  Procedure(s) Performed: COLONOSCOPY WITH PROPOFOL (N/A )  Patient location during evaluation: Endoscopy Anesthesia Type: General Level of consciousness: awake and alert and oriented Pain management: pain level controlled Vital Signs Assessment: post-procedure vital signs reviewed and stable Respiratory status: spontaneous breathing, nonlabored ventilation and respiratory function stable Cardiovascular status: blood pressure returned to baseline and stable Postop Assessment: no signs of nausea or vomiting Anesthetic complications: no     Last Vitals:  Vitals:   09/25/19 0948 09/25/19 0958  BP: 93/62 115/76  Pulse: 69 65  Resp: 12 15  Temp:    SpO2: 97% 94%    Last Pain:  Vitals:   09/25/19 0958  TempSrc:   PainSc: 0-No pain                 Phat [place]

## 2019-09-25 NOTE — Anesthesia Preprocedure Evaluation (Signed)
Anesthesia Evaluation  Patient identified by MRN, date of birth, ID band Patient awake    Reviewed: Allergy & Precautions, NPO status , Patient's Chart, lab work & pertinent test results  History of Anesthesia Complications (+) PONV and history of anesthetic complications  Airway Mallampati: III  TM Distance: >3 FB Neck ROM: Full    Dental  (+) Poor Dentition   Pulmonary sleep apnea , neg COPD, former smoker,    breath sounds clear to auscultation- rhonchi (-) wheezing      Cardiovascular hypertension, Pt. on medications (-) CAD, (-) Past MI, (-) Cardiac Stents and (-) CABG  Rhythm:Regular Rate:Normal - Systolic murmurs and - Diastolic murmurs    Neuro/Psych neg Seizures negative neurological ROS  negative psych ROS   GI/Hepatic Neg liver ROS, GERD  ,  Endo/Other  negative endocrine ROSneg diabetes  Renal/GU Renal disease: hx of nephrolithiasis.     Musculoskeletal  (+) Arthritis ,   Abdominal (+) + obese,   Peds  Hematology negative hematology ROS (+)   Anesthesia Other Findings Past Medical History: No date: Acquired spondylolisthesis No date: BPH (benign prostatic hyperplasia) No date: Bradycardia No date: Burn No date: Bursitis     Comment:  right elbow  No date: Complication of anesthesia     Comment:  difficult to wake up after a lithotripsy No date: DJD (degenerative joint disease) No date: Foot drop     Comment:  bil. 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 No date: Nephrolithiasis No date: Obesity No date: Peripheral polyneuropathy No  date: Personal history of PE (pulmonary embolism) No date: PONV (postoperative nausea and vomiting)     Comment:  after cataract surgery No date: Pre-diabetes No date: Scoliosis No date: Sleep apnea No date: SOB (shortness of breath) No date: Spinal stenosis, lumbar region without neurogenic  claudication No date: Urine frequency No date: UTI (urinary tract infection) during pregnancy, unspecified  trimester   Reproductive/Obstetrics                             Anesthesia Physical Anesthesia Plan  ASA: III  Anesthesia Plan: General   Post-op Pain Management:    Induction: Intravenous  PONV Risk Score and Plan: 2 and Propofol infusion  Airway Management Planned: Natural Airway  Additional Equipment:   Intra-op Plan:   Post-operative Plan:   Informed Consent: I have reviewed the patients History and Physical, chart, labs and discussed the procedure including the risks, benefits and alternatives for the proposed anesthesia with the patient or authorized representative who has indicated his/her understanding and acceptance.     Dental advisory given  Plan Discussed with: CRNA and Anesthesiologist  Anesthesia Plan Comments:         Anesthesia Quick Evaluation

## 2019-09-25 NOTE — Transfer of Care (Signed)
Immediate Anesthesia Transfer of Care Note  Patient: Thomas Mcdaniel  Procedure(s) Performed: COLONOSCOPY WITH PROPOFOL (N/A )  Patient Location: PACU  Anesthesia Type:General  Level of Consciousness: awake and sedated  Airway & Oxygen Therapy: Patient Spontanous Breathing and Patient connected to nasal cannula oxygen  Post-op Assessment: Report given to RN and Post -op Vital signs reviewed and stable  Post vital signs: Reviewed and stable  Last Vitals:  Vitals Value Taken Time  BP 159/121 09/25/19 0939  Temp    Pulse 76 09/25/19 0941  Resp 11 09/25/19 0941  SpO2 98 % 09/25/19 0941  Vitals shown include unvalidated device data.  Last Pain:  Vitals:   09/25/19 0811  TempSrc: Temporal  PainSc: 0-No pain         Complications: No apparent anesthesia complications

## 2019-09-28 LAB — SURGICAL PATHOLOGY

## 2020-01-12 ENCOUNTER — Ambulatory Visit
Admission: EM | Admit: 2020-01-12 | Discharge: 2020-01-12 | Disposition: A | Discharge status: Home / Self Care | Payer: Medicare Other | Attending: Emergency Medicine | Admitting: Emergency Medicine

## 2020-01-12 ENCOUNTER — Other Ambulatory Visit: Payer: Self-pay

## 2020-01-12 ENCOUNTER — Encounter: Payer: Self-pay | Admitting: Emergency Medicine

## 2020-01-12 DIAGNOSIS — S61511A Laceration without foreign body of right wrist, initial encounter: Secondary | ICD-10-CM

## 2020-01-12 DIAGNOSIS — Z23 Encounter for immunization: Secondary | ICD-10-CM

## 2020-01-12 MED ORDER — TETANUS-DIPHTH-ACELL PERTUSSIS 5-2.5-18.5 LF-MCG/0.5 IM SUSP
0.5000 mL | Freq: Once | INTRAMUSCULAR | Status: AC
  Administered 2020-01-12: 0.5 mL via INTRAMUSCULAR

## 2020-01-12 MED ORDER — CEPHALEXIN 500 MG PO CAPS: 1000.0000 mg | ORAL_CAPSULE | Freq: Two times a day (BID) | ORAL | 0 refills | Status: AC

## 2020-01-12 NOTE — ED Triage Notes (Addendum)
Pt has a laceration on his right wrist. Occurred about 30 minutes ago. He states his last tetanus was about 20 years ago. He states he cut it on a tractor part.

## 2020-01-12 NOTE — ED Provider Notes (Signed)
HPI  SUBJECTIVE:  Thomas Mcdaniel is a right-handed 74 y.o. male who presents with a laceration to his ulnar right wrist occurring immediately prior to arrival.  States that it was cut on a dirty, rusty, sharp tractor part.  Reports mild stinging pain.  No limitation of motion of the wrist or fingers, finger or wrist weakness, foreign body sensation.  No aggravating or alleviating factors.  He has not tried anything for this.  His last tetanus was about 20 years ago.  He has a past medical history of hypertension, took his medications this morning.  He has a history of PE, currently not on any anticoagulants.  No history of diabetes, peripheral neuropathy.  PMD: Dr. Kym Groom.    Past Medical History:  Diagnosis Date  . Acquired spondylolisthesis   . BPH (benign prostatic hyperplasia)   . Bradycardia   . Burn   . Bursitis    right elbow   . Complication of anesthesia    difficult to wake up after a lithotripsy  . DJD (degenerative joint disease)   . Foot drop    bil.  Marland Kitchen GERD (gastroesophageal reflux disease)   . Glucose intolerance (impaired glucose tolerance)   . Gout   . Hard of hearing   . Hemorrhoids   . Hyperlipidemia   . Hypertension   . Intervertebral disc disorders with radiculopathy, lumbar region   . Joint pain   . Lumbar spinal stenosis   . 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 Nephrolithiasis   . Obesity   . Peripheral polyneuropathy   . Personal history of PE (pulmonary embolism)   . PONV (postoperative nausea and vomiting)    after cataract surgery  . Pre-diabetes   . Scoliosis   . Sleep apnea   . SOB (shortness of breath)   . Spinal stenosis, lumbar region without neurogenic claudication   . Urine frequency   . UTI (urinary tract infection) during pregnancy, unspecified trimester     Past Surgical History:  Procedure Laterality Date  . CATARACT EXTRACTION    . CATARACT EXTRACTION W/PHACO Right 08/25/2019   Procedure: CATARACT EXTRACTION  PHACO AND INTRAOCULAR LENS PLACEMENT (IOC) MALYUGIN RIGHT 7.94  00:48.5;  Surgeon: Birder Robson, MD;  Location: [place];  Service: Ophthalmology;  Laterality: Right;  . COLONOSCOPY WITH PROPOFOL N/A 08/01/2015   Procedure: COLONOSCOPY WITH PROPOFOL;  Surgeon: Josefine Class, MD;  Location: [facility] ENDOSCOPY;  Service: Endoscopy;  Laterality: N/A;  . COLONOSCOPY WITH PROPOFOL N/A 09/25/2019   Procedure: COLONOSCOPY WITH PROPOFOL;  Surgeon: Robert Bellow, MD;  Location: [facility] ENDOSCOPY;  Service: Endoscopy;  Laterality: N/A;  . FEMORAL ARTERY REPAIR    . HERNIA REPAIR    . JOINT REPLACEMENT Right    knee  . KNEE ARTHROPLASTY Right 12/05/2015   Procedure: COMPUTER ASSISTED TOTAL KNEE ARTHROPLASTY;  Surgeon: Dereck Leep, MD;  Location: [facility] ORS;  Service: Orthopedics;  Laterality: Right;  . knee arthroscopy    . LAMINECTOMY    . LITHOTRIPSY    . LITHOTRIPSY    . SPINAL FUSION    . TOE AMPUTATION    . TONSILLECTOMY      Family History  Problem Relation Age of Onset  . Prostate cancer Neg Hx   . Kidney disease Neg Hx     Social History   Tobacco Use  . Smoking status: Former Smoker    Quit date: 07/31/1992    Years since quitting: 27.4  .  Smokeless tobacco: Never Used  Vaping Use  . Vaping Use: Never used  Substance Use Topics  . Alcohol use: No  . Drug use: No    No current facility-administered medications for this encounter.  Current Outpatient Medications:  .  acetaminophen (TYLENOL) 325 MG tablet, Take 650 mg by mouth every 6 (six) hours as needed., Disp: , Rfl:  .  allopurinol (ZYLOPRIM) 300 MG tablet, Take 300 mg by mouth daily., Disp: , Rfl:  .  amLODipine (NORVASC) 10 MG tablet, Take 10 mg by mouth daily., Disp: , Rfl:  .  colchicine 0.6 MG tablet, Take 0.6 mg by mouth 2 (two) times daily as needed. , Disp: , Rfl:  .  Docusate Calcium (STOOL SOFTENER PO), Take by mouth daily., Disp: , Rfl:  .  finasteride (PROSCAR) 5 MG tablet, Take 5 mg by mouth  daily., Disp: , Rfl:  .  lisinopril (PRINIVIL,ZESTRIL) 40 MG tablet, Take 40 mg by mouth daily., Disp: , Rfl:  .  montelukast (SINGULAIR) 10 MG tablet, Take 10 mg by mouth at bedtime., Disp: , Rfl:  .  tamsulosin (FLOMAX) 0.4 MG CAPS capsule, Take by mouth., Disp: , Rfl:  .  triamcinolone cream (KENALOG) 0.1 %, Apply 1 application topically 2 (two) times daily., Disp: , Rfl:  .  cephALEXin (KEFLEX) 500 MG capsule, Take 2 capsules (1,000 mg total) by mouth 2 (two) times daily for 5 days., Disp: 20 capsule, Rfl: 0  No Known Allergies   ROS  As noted in HPI.   Physical Exam  BP (!) 180/100 (BP Location: Left Arm)   Pulse 75   Temp 98.2 F (36.8 C) (Oral)   Resp 18   Ht 5\' 9"  (1.753 m)   Wt 114.3 kg   SpO2 96%   BMI 37.21 kg/m   Constitutional: Well developed, well nourished, no acute distress Eyes:  EOMI, conjunctiva normal bilaterally HENT: Normocephalic, atraumatic,mucus membranes moist Respiratory: Normal inspiratory effort Cardiovascular: Normal rate GI: nondistended skin: See MSK Musculoskeletal: 3 cm linear laceration ulnar aspect of right wrist.  Wound was explored with adequate hemostasis, no foreign body seen.  No neurovascular or tendon involvement seen.  Finger flexion extension intact in all fingers, two-point discrimination intact in all fingers.  Full strength with radial/ulnar deviation, wrist flexion extension.   Neurologic: Alert & oriented x 3, no focal neuro deficits Psychiatric: Speech and behavior appropriate   ED Course   Medications  Tdap (BOOSTRIX) injection 0.5 mL (0.5 mLs Intramuscular Given 01/12/20 1724)    Orders Placed This Encounter  Procedures  . Recheck vitals    BP    Standing Status:   Standing    Number of Occurrences:   1    No results found for this or any previous visit (from the past 24 hour(s)). No results found.  ED Clinical Impression  1. Laceration of right wrist, initial encounter      ED  Assessment/Plan  Irrigated wound out extensively with chlorhexidine and tap water.  Then washed with wound care solution.  Tetanus updated.  Procedure note: Cleaned area with alcohol.  Anesthetized area via local infiltration with 2 cc of lidocaine 1% with epinephrine with adequate anesthesia.  Placed six 4-0 Prolene interrupted sutures with close approximation of wound edges.  Bacitracin and dressing placed.  Patient tolerated procedure well.  Patient denies history of kidney disease.  BUN/creatinine in January 20 was normal.  Calculated creatinine clearance 124 mL/min.  Home with 5 days of Keflex, Tylenol/ibuprofen as  needed, return here in 10 days for suture removal, sooner for any signs of infection.    Discussed labs,MDM, treatment plan, and plan for follow-up with patient. Discussed sn/sx that should prompt return to the ED. patient agrees with plan.   Meds ordered this encounter  Medications  . Tdap (BOOSTRIX) injection 0.5 mL  . cephALEXin (KEFLEX) 500 MG capsule    Sig: Take 2 capsules (1,000 mg total) by mouth 2 (two) times daily for 5 days.    Dispense:  20 capsule    Refill:  0    *This clinic note was created using [employer]. Therefore, there may be occasional mistakes despite careful proofreading.   ?    Melynda Ripple, MD 01/14/20 [phone]

## 2020-01-12 NOTE — Discharge Instructions (Addendum)
Return here in 10 days for suture removal, sooner for any signs of infection.  May take 200 to 400 mg of ibuprofen with 500 mg of Tylenol 3 times a day as needed for pain.  Make sure you finish the antibiotics unless another provider tells you to stop.  Keep an eye on your blood pressure. Decrease your salt intake. diet and exercise will lower your blood pressure significantly. It is important to keep your blood pressure under good control, as having a elevated blood pressure for prolonged periods of time significantly increases your risk of stroke, heart attacks, kidney damage, eye damage, and other problems. Measure your blood pressure once a day, preferably at the same time every day. Keep a log of this and bring it to your next doctor's appointment.  Bring your blood pressure cuff as well.   Return immediately to the ER if you start having chest pain, headache, problems seeing, problems talking, problems walking, if you feel like you're about to pass out, if you do pass out, if you have a seizure, or for any other concerns.  Go to www.goodrx.com to look up your medications. This will give you a list of where you can find your prescriptions at the most affordable prices. Or ask the pharmacist what the cash price is, or if they have any other discount programs available to help make your medication more affordable. This can be less expensive than what you would pay with insurance.

## 2020-01-16 ENCOUNTER — Other Ambulatory Visit: Payer: Self-pay

## 2020-01-16 ENCOUNTER — Encounter: Payer: Self-pay | Admitting: Emergency Medicine

## 2020-01-16 ENCOUNTER — Emergency Department
Admission: EM | Admit: 2020-01-16 | Discharge: 2020-01-16 | Disposition: A | Discharge status: Home / Self Care | Payer: Medicare Other | Attending: Emergency Medicine | Admitting: Emergency Medicine

## 2020-01-16 DIAGNOSIS — Y929 Unspecified place or not applicable: Secondary | ICD-10-CM | POA: Insufficient documentation

## 2020-01-16 DIAGNOSIS — Z79899 Other long term (current) drug therapy: Secondary | ICD-10-CM | POA: Insufficient documentation

## 2020-01-16 DIAGNOSIS — S90562A Insect bite (nonvenomous), left ankle, initial encounter: Secondary | ICD-10-CM | POA: Diagnosis present

## 2020-01-16 DIAGNOSIS — Y999 Unspecified external cause status: Secondary | ICD-10-CM | POA: Diagnosis not present

## 2020-01-16 DIAGNOSIS — Z87891 Personal history of nicotine dependence: Secondary | ICD-10-CM | POA: Diagnosis not present

## 2020-01-16 DIAGNOSIS — Z96651 Presence of right artificial knee joint: Secondary | ICD-10-CM | POA: Insufficient documentation

## 2020-01-16 DIAGNOSIS — Y939 Activity, unspecified: Secondary | ICD-10-CM | POA: Insufficient documentation

## 2020-01-16 DIAGNOSIS — I10 Essential (primary) hypertension: Secondary | ICD-10-CM | POA: Diagnosis not present

## 2020-01-16 DIAGNOSIS — W57XXXA Bitten or stung by nonvenomous insect and other nonvenomous arthropods, initial encounter: Secondary | ICD-10-CM | POA: Insufficient documentation

## 2020-01-16 DIAGNOSIS — T63481A Toxic effect of venom of other arthropod, accidental (unintentional), initial encounter: Secondary | ICD-10-CM

## 2020-01-16 MED ORDER — MUPIROCIN CALCIUM 2 % EX CREA
TOPICAL_CREAM | Freq: Once | CUTANEOUS | Status: DC
  Filled 2020-01-16: qty 15

## 2020-01-16 MED ORDER — MUPIROCIN 2 % EX OINT: 1.0000 "application " | TOPICAL_OINTMENT | Freq: Two times a day (BID) | CUTANEOUS | 0 refills | Status: DC

## 2020-01-16 MED ORDER — BACITRACIN-NEOMYCIN-POLYMYXIN 400-5-5000 EX OINT
TOPICAL_OINTMENT | Freq: Once | CUTANEOUS | Status: AC
  Administered 2020-01-16: 1 via TOPICAL
  Filled 2020-01-16: qty 1

## 2020-01-16 MED ORDER — DEXAMETHASONE SODIUM PHOSPHATE 10 MG/ML IJ SOLN
10.0000 mg | Freq: Once | INTRAMUSCULAR | Status: AC
  Administered 2020-01-16: 10 mg via INTRAMUSCULAR
  Filled 2020-01-16: qty 1

## 2020-01-16 NOTE — ED Notes (Signed)
Patient declined discharge vital signs. 

## 2020-01-16 NOTE — Discharge Instructions (Addendum)
You were seen today for an insect sting of your left inner ankle.  You received a steroid injection to help with the pain and swelling.  We have given you a prescription for antibiotic ointment to put on the affected area twice daily until it heals.  Monitor for increased pain, swelling, redness or discharge from the area.  Follow-up with your PCP if symptoms persist or worsen.

## 2020-01-16 NOTE — ED Provider Notes (Signed)
[facility] Emergency Department Provider Note ____________________________________________  Time seen: 2135  I have reviewed the triage vital signs and the nursing notes.  HISTORY  Chief Complaint  Insect Bite   HPI Thomas Mcdaniel is a 74 y.o. male presents to the ER today with complaint of insect bite/sting to his left ankle.  He reports this occurred around noon today while he was mowing the lawn.  He reports the area is swollen and burns.  He denies any difficulty with ambulation.  He denies prior history of allergic reaction to insect bite/stings. He has used an alcohol based product and liniment on it with minimal relief.  Past Medical History:  Diagnosis Date  . Acquired spondylolisthesis   . BPH (benign prostatic hyperplasia)   . Bradycardia   . Burn   . Bursitis    right elbow   . Complication of anesthesia    difficult to wake up after a lithotripsy  . DJD (degenerative joint disease)   . Foot drop    bil.  Marland Kitchen GERD (gastroesophageal reflux disease)   . Glucose intolerance (impaired glucose tolerance)   . Gout   . Hard of hearing   . Hemorrhoids   . Hyperlipidemia   . Hypertension   . Intervertebral disc disorders with radiculopathy, lumbar region   . Joint pain   . Lumbar spinal stenosis   . 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 Nephrolithiasis   . Obesity   . Peripheral polyneuropathy   . Personal history of PE (pulmonary embolism)   . PONV (postoperative nausea and vomiting)    after cataract surgery  . Pre-diabetes   . Scoliosis   . Sleep apnea   . SOB (shortness of breath)   . Spinal stenosis, lumbar region without neurogenic claudication   . Urine frequency   . UTI (urinary tract infection) during pregnancy, unspecified trimester     Patient Active Problem List   Diagnosis Date Noted  . Altered mental status 12/14/2015  . Altered mental state 12/14/2015  . 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 CATARACT EXTRACTION    . CATARACT EXTRACTION W/PHACO Right 08/25/2019   Procedure: CATARACT EXTRACTION PHACO AND INTRAOCULAR LENS PLACEMENT (IOC) MALYUGIN RIGHT 7.94  00:48.5;  Surgeon: Birder Robson, MD;  Location: [place];  Service: Ophthalmology;  Laterality: Right;  . COLONOSCOPY WITH PROPOFOL N/A 08/01/2015   Procedure: COLONOSCOPY WITH PROPOFOL;  Surgeon: Josefine Class, MD;  Location: [facility] ENDOSCOPY;  Service: Endoscopy;  Laterality: N/A;  . COLONOSCOPY WITH PROPOFOL N/A 09/25/2019   Procedure: COLONOSCOPY WITH PROPOFOL;  Surgeon: Robert Bellow, MD;  Location: [facility] ENDOSCOPY;  Service: Endoscopy;  Laterality: N/A;  . FEMORAL ARTERY REPAIR    . HERNIA REPAIR    . JOINT REPLACEMENT Right    knee  . KNEE ARTHROPLASTY Right 12/05/2015   Procedure: COMPUTER ASSISTED TOTAL KNEE ARTHROPLASTY;  Surgeon: Dereck Leep, MD;  Location: [facility] ORS;  Service: Orthopedics;  Laterality: Right;  . knee arthroscopy    . LAMINECTOMY    . LITHOTRIPSY    . LITHOTRIPSY    . SPINAL FUSION    . TOE AMPUTATION    . TONSILLECTOMY      Prior to Admission medications   Medication Sig Start Date End Date Taking? Authorizing Provider  acetaminophen (TYLENOL) 325 MG tablet Take 650 mg by mouth every 6 (six) hours as needed.  [provider]  allopurinol (ZYLOPRIM) 300 MG tablet Take 300 mg by mouth daily.    [provider]  amLODipine (NORVASC) 10 MG tablet Take 10 mg by mouth daily.    [provider]  cephALEXin (KEFLEX) 500 MG capsule Take 2 capsules (1,000 mg total) by mouth 2 (two) times daily for 5 days. 01/12/20 01/17/20  Melynda Ripple, MD  colchicine 0.6 MG tablet Take 0.6 mg by mouth 2 (two) times daily as needed.     [provider]  Docusate Calcium (STOOL SOFTENER PO) Take by mouth daily.    [provider]  finasteride (PROSCAR) 5 MG tablet Take 5 mg by mouth daily.     [provider]  lisinopril (PRINIVIL,ZESTRIL) 40 MG tablet Take 40 mg by mouth daily.    [provider]  montelukast (SINGULAIR) 10 MG tablet Take 10 mg by mouth at bedtime.    [provider]  tamsulosin (FLOMAX) 0.4 MG CAPS capsule Take by mouth.    [provider]  triamcinolone cream (KENALOG) 0.1 % Apply 1 application topically 2 (two) times daily.    [provider]  famotidine (PEPCID) 40 MG tablet Take 40 mg by mouth daily as needed.   01/12/20  [provider]    Allergies Statins  Family History  Problem Relation Age of Onset  . Prostate cancer Neg Hx   . Kidney disease Neg Hx     Social History Social History   Tobacco Use  . Smoking status: Former Smoker    Quit date: 07/31/1992    Years since quitting: 27.4  . Smokeless tobacco: Never Used  Vaping Use  . Vaping Use: Never used  Substance Use Topics  . Alcohol use: No  . Drug use: No    Review of Systems  Constitutional: Negative for fever, chills or body aches. Cardiovascular: Negative for chest pain or chest tightness. Respiratory: Negative for cough or shortness of breath. Skin: Positive for insect bite/sting to left inner ankle.  ____________________________________________  PHYSICAL EXAM:  VITAL SIGNS: ED Triage Vitals  Enc Vitals Group     BP 01/16/20 1914 (!) 146/85     Pulse Rate 01/16/20 1914 82     Resp 01/16/20 1914 18     Temp 01/16/20 1914 98 F (36.7 C)     Temp Source 01/16/20 1914 Oral     SpO2 01/16/20 1914 97 %     Weight 01/16/20 1912 (!) 251 lb 15.8 oz (114.3 kg)     Height 01/16/20 1912 5\' 9"  (1.753 m)     Head Circumference --      Peak Flow --      Pain Score 01/16/20 1916 6     Pain Loc --      Pain Edu? --      Excl. in [place]? --     Constitutional: Alert and oriented. Obese, in no distress. Head: Normocephalic and atraumatic. Cardiovascular: Normal rate, regular rhythm. Pedal pulse 2+ on the left. Respiratory:  Normal respiratory effort. No wheezes/rales/rhonchi. Musculoskeletal: Normal flexion, extension and rotation of the left ankle. No pain with palpation over the left medial malleolus. 1+ swelling over the left medial malleolus. Neurologic:  Normal speech and language.Decreased sensation of b/l feet d/t neuropathy. Skin:  Pinpoint open area with surrounding swelling but no erythema noted over left medial malleolus. ____________________________________________  INITIAL IMPRESSION / ASSESSMENT AND PLAN / ED COURSE  Insect Sting, Left Ankle:  No evidence of infection Decadron 10  mg IM x 1 Mupiricon ointment, covered with [facility]anda[facility] RX for Mupircon ointment BID prn ____________________________________________  FINAL CLINICAL IMPRESSION(S) / ED DIAGNOSES  Final diagnoses:  None      Jearld Fenton, NP 01/16/20 2157    Nance Pear, MD 01/16/20 2201

## 2020-01-16 NOTE — ED Triage Notes (Signed)
Pt arrived via POV with c/o bee sting to left ankle that occurred today. Pt states the site is swollen and stinging.  Pt thinks it was sting from a hornet or yellow jacket. Pt able to wear shoes and socks. ]  No allergies to bee stings

## 2020-01-22 ENCOUNTER — Encounter: Payer: Self-pay | Admitting: Emergency Medicine

## 2020-01-22 ENCOUNTER — Ambulatory Visit
Admission: EM | Admit: 2020-01-22 | Discharge: 2020-01-22 | Disposition: A | Discharge status: Home / Self Care | Payer: Medicare Other | Attending: Internal Medicine | Admitting: Internal Medicine

## 2020-01-22 ENCOUNTER — Other Ambulatory Visit: Payer: Self-pay

## 2020-01-22 DIAGNOSIS — Z4802 Encounter for removal of sutures: Secondary | ICD-10-CM

## 2020-01-22 NOTE — ED Triage Notes (Signed)
Patient here to have sutures removed from his right wrist.  Incision is well healed and no signs of infection.

## 2020-02-28 ENCOUNTER — Other Ambulatory Visit: Payer: Self-pay

## 2020-02-28 ENCOUNTER — Emergency Department
Admission: EM | Admit: 2020-02-28 | Discharge: 2020-02-28 | Disposition: A | Discharge status: Home / Self Care | Payer: Medicare Other | Attending: Emergency Medicine | Admitting: Emergency Medicine

## 2020-02-28 DIAGNOSIS — W57XXXA Bitten or stung by nonvenomous insect and other nonvenomous arthropods, initial encounter: Secondary | ICD-10-CM | POA: Diagnosis not present

## 2020-02-28 DIAGNOSIS — Y939 Activity, unspecified: Secondary | ICD-10-CM | POA: Insufficient documentation

## 2020-02-28 DIAGNOSIS — Y998 Other external cause status: Secondary | ICD-10-CM | POA: Insufficient documentation

## 2020-02-28 DIAGNOSIS — S90562A Insect bite (nonvenomous), left ankle, initial encounter: Secondary | ICD-10-CM | POA: Insufficient documentation

## 2020-02-28 DIAGNOSIS — Z5321 Procedure and treatment not carried out due to patient leaving prior to being seen by health care provider: Secondary | ICD-10-CM | POA: Diagnosis not present

## 2020-02-28 DIAGNOSIS — Y929 Unspecified place or not applicable: Secondary | ICD-10-CM | POA: Diagnosis not present

## 2020-02-28 NOTE — ED Notes (Signed)
No answer in lobby or outside 

## 2020-02-28 NOTE — ED Triage Notes (Signed)
Pt arrived via POV with reports of insect sting to left ankle, pt states he has been stung recently multiple times over the past several weeks, pt states the pain got worse when he was going to bed.

## 2020-05-26 ENCOUNTER — Ambulatory Visit: Payer: Self-pay | Admitting: Urology

## 2020-05-27 ENCOUNTER — Encounter: Payer: Self-pay | Admitting: Urology

## 2020-06-08 ENCOUNTER — Other Ambulatory Visit: Payer: Self-pay

## 2020-06-08 ENCOUNTER — Encounter: Payer: Self-pay | Admitting: Urology

## 2020-06-08 ENCOUNTER — Ambulatory Visit (INDEPENDENT_AMBULATORY_CARE_PROVIDER_SITE_OTHER): Payer: Medicare Other | Admitting: Urology

## 2020-06-08 VITALS — BP 99/61 | HR 84 | Ht 69.0 in | Wt 248.0 lb

## 2020-06-08 DIAGNOSIS — N138 Other obstructive and reflux uropathy: Secondary | ICD-10-CM | POA: Diagnosis not present

## 2020-06-08 DIAGNOSIS — N401 Enlarged prostate with lower urinary tract symptoms: Secondary | ICD-10-CM | POA: Diagnosis not present

## 2020-06-08 DIAGNOSIS — N400 Enlarged prostate without lower urinary tract symptoms: Secondary | ICD-10-CM | POA: Diagnosis not present

## 2020-06-08 DIAGNOSIS — N3281 Overactive bladder: Secondary | ICD-10-CM | POA: Diagnosis not present

## 2020-06-08 LAB — BLADDER SCAN AMB NON-IMAGING

## 2020-06-08 NOTE — Progress Notes (Signed)
06/08/20 4:39 PM   Thomas Mcdaniel 08-26-1945 [id number]  CC: Urinary symptoms  HPI: I saw Thomas Mcdaniel in urology clinic today for urinary symptoms.  He was previously followed by [facility] urology, and was last seen in 2019.  He is on maximal medical therapy with Flomax and finasteride.  His primary complaint is nocturia and urinary frequency during the day.  IPSS score today is 24, with quality of life mixed.  PVR is normal at 46 mL.  Urinalysis is benign with 0-5 WBCs, 0-2 RBCs, no bacteria, nitrite negative, no leukocytes.  He drinks primarily tea during the day.  He has been evaluated for sleep apnea in the past that was negative.  He reportedly had a negative CT and cystoscopy around 2015 at [facility].  At the last visit with [facility] in 2019 they recommended a trial of Detrol for overactive bladder, but it is unclear if he ever started this medication, and did not follow-up with them.  He denies any significant problems with weak stream.  He does have some urgency and occasional urge incontinence.  Last PSA was 0.32 in 2018, corrected for finasteride 0.6.  Screening discontinued at age 47 per AUA guidelines.  Renal ultrasound from 2017 showed no hydronephrosis  PMH: Past Medical History:  Diagnosis Date  . Acquired spondylolisthesis   . BPH (benign prostatic hyperplasia)   . Bradycardia   . Burn   . Bursitis    right elbow   . Complication of anesthesia    difficult to wake up after a lithotripsy  . DJD (degenerative joint disease)   . Foot drop    bil.  Marland Kitchen GERD (gastroesophageal reflux disease)   . Glucose intolerance (impaired glucose tolerance)   . Gout   . Hard of hearing   . Hemorrhoids   . Hyperlipidemia   . Hypertension   . Intervertebral disc disorders with radiculopathy, lumbar region   . Joint pain   . Lumbar spinal stenosis   . 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 Nephrolithiasis   . Obesity   . Peripheral polyneuropathy   . Personal history of PE  (pulmonary embolism)   . PONV (postoperative nausea and vomiting)    after cataract surgery  . Pre-diabetes   . Scoliosis   . Sleep apnea   . SOB (shortness of breath)   . Spinal stenosis, lumbar region without neurogenic claudication   . Urine frequency   . UTI (urinary tract infection) during pregnancy, unspecified trimester     Surgical History: Past Surgical History:  Procedure Laterality Date  . CATARACT EXTRACTION    . CATARACT EXTRACTION W/PHACO Right 08/25/2019   Procedure: CATARACT EXTRACTION PHACO AND INTRAOCULAR LENS PLACEMENT (IOC) MALYUGIN RIGHT 7.94  00:48.5;  Surgeon: Birder Robson, MD;  Location: [place];  Service: Ophthalmology;  Laterality: Right;  . COLONOSCOPY WITH PROPOFOL N/A 08/01/2015   Procedure: COLONOSCOPY WITH PROPOFOL;  Surgeon: Josefine Class, MD;  Location: [facility] ENDOSCOPY;  Service: Endoscopy;  Laterality: N/A;  . COLONOSCOPY WITH PROPOFOL N/A 09/25/2019   Procedure: COLONOSCOPY WITH PROPOFOL;  Surgeon: Robert Bellow, MD;  Location: [facility] ENDOSCOPY;  Service: Endoscopy;  Laterality: N/A;  . FEMORAL ARTERY REPAIR    . HERNIA REPAIR    . JOINT REPLACEMENT Right    knee  . KNEE ARTHROPLASTY Right 12/05/2015   Procedure: COMPUTER ASSISTED TOTAL KNEE ARTHROPLASTY;  Surgeon: Dereck Leep, MD;  Location: [facility] ORS;  Service: Orthopedics;  Laterality: Right;  .  knee arthroscopy    . LAMINECTOMY    . LITHOTRIPSY    . LITHOTRIPSY    . SPINAL FUSION    . TOE AMPUTATION    . TONSILLECTOMY      Family History: Family History  Problem Relation Age of Onset  . Prostate cancer Neg Hx   . Kidney disease Neg Hx     Social History:  reports that he quit smoking about 27 years ago. He has never used smokeless tobacco. He reports that he does not drink alcohol and does not use drugs.  Physical Exam: BP 99/61   Pulse 84   Ht 5\' 9"  (1.753 m)   Wt 248 lb (112.5 kg)   BMI 36.62 kg/m    Constitutional:  Alert and oriented, No acute  distress. Cardiovascular: No clubbing, cyanosis, or edema. Respiratory: Normal respiratory effort, no increased work of breathing. GI: Abdomen is soft, nontender, nondistended, no abdominal masses GU: Uncircumcised phallus with patent meatus, no lesions  Laboratory Data: Reviewed, see HPI  Pertinent Imaging: No recent cross-sectional imaging to review  Assessment & Plan:   74 year old male with a long history of urinary symptoms, acutely worsened over the last 1 to 2 years with urgency, frequency, and nocturia.  He is on maximal medical therapy for BPH.  I suspect his symptoms are more related to overactive bladder, and I recommended a trial of an OAB medication, and samples of Myrbetriq were given.  We discussed that overactive bladder (OAB) is not a disease, but is a symptom complex that is generally not life-threatening.  Symptoms typically include urinary urgency, frequency, and urge incontinence.  There are numerous treatment options, however there are risks and benefits with both medical and surgical management.  First-line treatment is behavioral therapies including bladder training, pelvic floor muscle training, and fluid management.  Second line treatments include oral antimuscarinics(Ditropan er, Trospium) and beta-3 agonist (Mybetriq). There is typically a period of medication trial (4-8 weeks) to find the optimal therapy and dosing. If symptoms are bothersome despite the above management, third line options include intra-detrusor botox, peripheral tibial nerve stimulation (PTNS), and interstim (SNS). These are more invasive treatments with higher side effect profile, but may improve quality of life for patients with severe OAB symptoms.   Behavioral strategies discussed Trial of Myrbetriq 50 mg daily, samples given RTC 1 month symptom check, if no significant improvement in symptoms recommend cystoscopy  Nickolas Madrid, MD 06/08/2020  [place] [address] [place], [place] [address] [phone]

## 2020-06-08 NOTE — Patient Instructions (Signed)
Overactive Bladder, Adult  Overactive bladder refers to a condition in which a person has a sudden need to pass urine. The person may leak urine if he or she cannot get to the bathroom fast enough (urinary incontinence). A person with this condition may also wake up several times in the night to go to the bathroom. Overactive bladder is associated with poor nerve signals between your bladder and your brain. Your bladder may get the signal to empty before it is full. You may also have very sensitive muscles that make your bladder squeeze too soon. These symptoms might interfere with daily work or social activities. What are the causes? This condition may be associated with or caused by:  Urinary tract infection.  Infection of nearby tissues, such as the prostate.  Prostate enlargement.  Surgery on the uterus or urethra.  Bladder stones, inflammation, or tumors.  Drinking too much caffeine or alcohol.  Certain medicines, especially medicines that get rid of extra fluid in the body (diuretics).  Muscle or nerve weakness, especially from: ? A spinal cord injury. ? Stroke. ? Multiple sclerosis. ? Parkinson's disease.  Diabetes.  Constipation. What increases the risk? You may be at greater risk for overactive bladder if you:  Are an older adult.  Smoke.  Are going through menopause.  Have prostate problems.  Have a neurological disease, such as stroke, dementia, Parkinson's disease, or multiple sclerosis (MS).  Eat or drink things that irritate the bladder. These include alcohol, spicy food, and caffeine.  Are overweight or obese. What are the signs or symptoms? Symptoms of this condition include:  Sudden, strong urge to urinate.  Leaking urine.  Urinating 8 or more times a day.  Waking up to urinate 2 or more times a night. How is this diagnosed? Your health care provider may suspect overactive bladder based on your symptoms. He or she will diagnose this condition  by:  A physical exam and medical history.  Blood or urine tests. You might need bladder or urine tests to help determine what is causing your overactive bladder. You might also need to see a health care provider who specializes in urinary tract problems (urologist). How is this treated? Treatment for overactive bladder depends on the cause of your condition and whether it is mild or severe. You can also make lifestyle changes at home. Options include:  Bladder training. This may include: ? Learning to control the urge to urinate by following a schedule that directs you to urinate at regular intervals (timed voiding). ? Doing Kegel exercises to strengthen your pelvic floor muscles, which support your bladder. Toning these muscles can help you control urination, even if your bladder muscles are overactive.  Special devices. This may include: ? Biofeedback, which uses sensors to help you become aware of your body's signals. ? Electrical stimulation, which uses electrodes placed inside the body (implanted) or outside the body. These electrodes send gentle pulses of electricity to strengthen the nerves or muscles that control the bladder. ? Women may use a plastic device that fits into the vagina and supports the bladder (pessary).  Medicines. ? Antibiotics to treat bladder infection. ? Antispasmodics to stop the bladder from releasing urine at the wrong time. ? Tricyclic antidepressants to relax bladder muscles. ? Injections of botulinum toxin type A directly into the bladder tissue to relax bladder muscles.  Lifestyle changes. This may include: ? Weight loss. Talk to your health care provider about weight loss methods that would work best for you. ?  Diet changes. This may include reducing how much alcohol and caffeine you consume, or drinking fluids at different times of the day. ? Not smoking. Do not use any products that contain nicotine or tobacco, such as cigarettes and e-cigarettes. If  you need help quitting, ask your health care provider.  Surgery. ? A device may be implanted to help manage the nerve signals that control urination. ? An electrode may be implanted to stimulate electrical signals in the bladder. ? A procedure may be done to change the shape of the bladder. This is done only in very severe cases. Follow these instructions at home: Lifestyle  Make any diet or lifestyle changes that are recommended by your health care provider. These may include: ? Drinking less fluid or drinking fluids at different times of the day. ? Cutting down on caffeine or alcohol. ? Doing Kegel exercises. ? Losing weight if needed. ? Eating a healthy and balanced diet to prevent constipation. This may include:  Eating foods that are high in fiber, such as fresh fruits and vegetables, whole grains, and beans.  Limiting foods that are high in fat and processed sugars, such as fried and sweet foods. General instructions  Take over-the-counter and prescription medicines only as told by your health care provider.  If you were prescribed an antibiotic medicine, take it as told by your health care provider. Do not stop taking the antibiotic even if you start to feel better.  Use any implants or pessary as told by your health care provider.  If needed, wear pads to absorb urine leakage.  Keep a journal or log to track how much and when you drink and when you feel the need to urinate. This will help your health care provider monitor your condition.  Keep all follow-up visits as told by your health care provider. This is important. Contact a health care provider if:  You have a fever.  Your symptoms do not get better with treatment.  Your pain and discomfort get worse.  You have more frequent urges to urinate. Get help right away if:  You are not able to control your bladder. Summary  Overactive bladder refers to a condition in which a person has a sudden need to pass  urine.  Several conditions may lead to an overactive bladder.  Treatment for overactive bladder depends on the cause and severity of your condition.  Follow your health care provider's instructions about lifestyle changes, doing Kegel exercises, keeping a journal, and taking medicines. This information is not intended to replace advice given to you by your health care provider. Make sure you discuss any questions you have with your health care provider. Document Revised: 10/02/2018 Document Reviewed: 06/27/2017 Elsevier Patient Education  [place]. Benign Prostatic Hyperplasia  Benign prostatic hyperplasia (BPH) is an enlarged prostate gland that is caused by the normal aging process and not by cancer. The prostate is a walnut-sized gland that is involved in the production of semen. It is located in front of the rectum and below the bladder. The bladder stores urine and the urethra is the tube that carries the urine out of the body. The prostate may get bigger as a man gets older. An enlarged prostate can press on the urethra. This can make it harder to pass urine. The build-up of urine in the bladder can cause infection. Back pressure and infection may progress to bladder damage and kidney (renal) failure. What are the causes? This condition is part of a normal  aging process. However, not all men develop problems from this condition. If the prostate enlarges away from the urethra, urine flow will not be blocked. If it enlarges toward the urethra and compresses it, there will be problems passing urine. What increases the risk? This condition is more likely to develop in men over the age of 61 years. What are the signs or symptoms? Symptoms of this condition include:  Getting up often during the night to urinate.  Needing to urinate frequently during the day.  Difficulty starting urine flow.  Decrease in size and strength of your urine stream.  Leaking (dribbling) after  urinating.  Inability to pass urine. This needs immediate treatment.  Inability to completely empty your bladder.  Pain when you pass urine. This is more common if there is also an infection.  Urinary tract infection (UTI). How is this diagnosed? This condition is diagnosed based on your medical history, a physical exam, and your symptoms. Tests will also be done, such as:  A post-void bladder scan. This measures any amount of urine that may remain in your bladder after you finish urinating.  A digital rectal exam. In a rectal exam, your health care provider checks your prostate by putting a lubricated, gloved finger into your rectum to feel the back of your prostate gland. This exam detects the size of your gland and any abnormal lumps or growths.  An exam of your urine (urinalysis).  A prostate specific antigen (PSA) screening. This is a blood test used to screen for prostate cancer.  An ultrasound. This test uses sound waves to electronically produce a picture of your prostate gland. Your health care provider may refer you to a specialist in kidney and prostate diseases (urologist). How is this treated? Once symptoms begin, your health care provider will monitor your condition (active surveillance or watchful waiting). Treatment for this condition will depend on the severity of your condition. Treatment may include:  Observation and yearly exams. This may be the only treatment needed if your condition and symptoms are mild.  Medicines to relieve your symptoms, including: ? Medicines to shrink the prostate. ? Medicines to relax the muscle of the prostate.  Surgery in severe cases. Surgery may include: ? Prostatectomy. In this procedure, the prostate tissue is removed completely through an open incision or with a laparoscope or robotics. ? Transurethral resection of the prostate (TURP). In this procedure, a tool is inserted through the opening at the tip of the penis (urethra). It  is used to cut away tissue of the inner core of the prostate. The pieces are removed through the same opening of the penis. This removes the blockage. ? Transurethral incision (TUIP). In this procedure, small cuts are made in the prostate. This lessens the prostate's pressure on the urethra. ? Transurethral microwave thermotherapy (TUMT). This procedure uses microwaves to create heat. The heat destroys and removes a small amount of prostate tissue. ? Transurethral needle ablation (TUNA). This procedure uses radio frequencies to destroy and remove a small amount of prostate tissue. ? Interstitial laser coagulation ([place]). This procedure uses a laser to destroy and remove a small amount of prostate tissue. ? Transurethral electrovaporization (TUVP). This procedure uses electrodes to destroy and remove a small amount of prostate tissue. ? Prostatic urethral lift. This procedure inserts an implant to push the lobes of the prostate away from the urethra. Follow these instructions at home:  Take over-the-counter and prescription medicines only as told by your health care provider.  Monitor  your symptoms for any changes. Contact your health care provider with any changes.  Avoid drinking large amounts of liquid before going to bed or out in public.  Avoid or reduce how much caffeine or alcohol you drink.  Give yourself time when you urinate.  Keep all follow-up visits as told by your health care provider. This is important. Contact a health care provider if:  You have unexplained back pain.  Your symptoms do not get better with treatment.  You develop side effects from the medicine you are taking.  Your urine becomes very dark or has a bad smell.  Your lower abdomen becomes distended and you have trouble passing your urine. Get help right away if:  You have a fever or chills.  You suddenly cannot urinate.  You feel lightheaded, or very dizzy, or you faint.  There are large amounts of  blood or clots in the urine.  Your urinary problems become hard to manage.  You develop moderate to severe low back or flank pain. The flank is the side of your body between the ribs and the hip. These symptoms may represent a serious problem that is an emergency. Do not wait to see if the symptoms will go away. Get medical help right away. Call your local emergency services (911 in the [place] Do not drive yourself to the hospital. Summary  Benign prostatic hyperplasia (BPH) is an enlarged prostate that is caused by the normal aging process and not by cancer.  An enlarged prostate can press on the urethra. This can make it hard to pass urine.  This condition is part of a normal aging process and is more likely to develop in men over the age of 49 years.  Get help right away if you suddenly cannot urinate. This information is not intended to replace advice given to you by your health care provider. Make sure you discuss any questions you have with your health care provider. Document Revised: 05/06/2018 Document Reviewed: 07/16/2016 Elsevier Patient Education  2020 [employer].

## 2020-06-09 LAB — URINALYSIS, COMPLETE
Bilirubin, UA: NEGATIVE
Glucose, UA: NEGATIVE
Ketones, UA: NEGATIVE
Leukocytes,UA: NEGATIVE
Nitrite, UA: NEGATIVE
Protein,UA: NEGATIVE
Specific Gravity, UA: 1.025 (ref 1.005–1.030)
Urobilinogen, Ur: 0.2 mg/dL (ref 0.2–1.0)
pH, UA: 5 (ref 5.0–7.5)

## 2020-06-09 LAB — MICROSCOPIC EXAMINATION: Bacteria, UA: NONE SEEN

## 2020-07-06 ENCOUNTER — Ambulatory Visit: Payer: Self-pay | Admitting: Urology

## 2020-08-11 ENCOUNTER — Ambulatory Visit (INDEPENDENT_AMBULATORY_CARE_PROVIDER_SITE_OTHER): Payer: Medicare Other | Admitting: Urology

## 2020-08-11 ENCOUNTER — Other Ambulatory Visit: Payer: Self-pay

## 2020-08-11 ENCOUNTER — Encounter: Payer: Self-pay | Admitting: Urology

## 2020-08-11 VITALS — BP 136/80 | HR 82 | Ht 69.0 in | Wt 248.0 lb

## 2020-08-11 DIAGNOSIS — N3281 Overactive bladder: Secondary | ICD-10-CM

## 2020-08-11 DIAGNOSIS — N400 Enlarged prostate without lower urinary tract symptoms: Secondary | ICD-10-CM | POA: Diagnosis not present

## 2020-08-11 LAB — BLADDER SCAN AMB NON-IMAGING

## 2020-08-11 MED ORDER — TROSPIUM CHLORIDE ER 60 MG PO CP24: 60.0000 mg | ORAL_CAPSULE | Freq: Every day | ORAL | 11 refills | Status: DC

## 2020-08-11 NOTE — Patient Instructions (Signed)
Minimize fluid intake 3 to 4 hours before bedtime.  Avoid bladder irritants like tea.

## 2020-08-11 NOTE — Progress Notes (Signed)
   08/11/2020 4:25 PM   Thomas Mcdaniel 1945-10-31 [id number]  Reason for visit: Follow up BPH/OAB  HPI: I saw 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  He is a 75 year old male who was previously followed by [facility] urology who I saw for the first time in December 2021.  He is on maximal medical therapy with Flomax and finasteride.  Primary complaint is nocturia 4-5 times per night and urinary frequency and urgency during the day.  At her last visit urinalysis was completely benign, and PVR was normal at 46 mL.  PVR is normal again today at 80 mL.  At her last visit we opted for trial of Myrbetriq 50 mg daily.  He took this for only a few days before he started to have some upset stomach that he attributed to the Myrbetriq, and discontinued this medication.  He did not notice any improvement in his urinary symptoms.  He denies any lower extremity swelling in the evenings.  He reportedly has been evaluated for sleep apnea in the past and that was negative.  He also reportedly had a negative CT and cystoscopy around 2015 at [facility].  We discussed the overlap and differences between BPH, and OAB.  His primary complaint is nocturia and some urgency and frequency during the day.  He is emptying his bladder well and urinalysis is benign.  We opted for another trial of an OAB medication with trospium.  I also discussed behavioral strategies including minimizing tea in the diet, minimizing fluids before bed, and voiding prior to bedtime.  -Trial of trospium 60 mg XL daily, consider cystoscopy in the future if persistently bothersome urinary symptoms -RTC 4 to 6 weeks for IPSS, PVR, symptom check   Billey Co, MD  [facility] [address] [place], [place] [address] [phone]

## 2020-09-28 ENCOUNTER — Encounter: Payer: Self-pay | Admitting: Urology

## 2020-09-28 ENCOUNTER — Ambulatory Visit: Payer: Medicare Other | Admitting: Urology

## 2020-10-13 ENCOUNTER — Encounter: Payer: Self-pay | Admitting: Urology

## 2020-10-13 ENCOUNTER — Ambulatory Visit (INDEPENDENT_AMBULATORY_CARE_PROVIDER_SITE_OTHER): Payer: Medicare Other | Admitting: Urology

## 2020-10-13 ENCOUNTER — Other Ambulatory Visit: Payer: Self-pay

## 2020-10-13 VITALS — BP 114/71 | HR 79 | Ht 69.0 in | Wt 248.0 lb

## 2020-10-13 DIAGNOSIS — N401 Enlarged prostate with lower urinary tract symptoms: Secondary | ICD-10-CM

## 2020-10-13 DIAGNOSIS — N138 Other obstructive and reflux uropathy: Secondary | ICD-10-CM

## 2020-10-13 DIAGNOSIS — N3281 Overactive bladder: Secondary | ICD-10-CM

## 2020-10-13 LAB — BLADDER SCAN AMB NON-IMAGING

## 2020-10-13 NOTE — Patient Instructions (Signed)
Cystoscopy Cystoscopy is a procedure that is used to help diagnose and sometimes treat conditions that affect the lower urinary tract. The lower urinary tract includes the bladder and the urethra. The urethra is the tube that drains urine from the bladder. Cystoscopy is done using a thin, tube-shaped instrument with a light and camera at the end (cystoscope). The cystoscope may be hard or flexible, depending on the goal of the procedure. The cystoscope is inserted through the urethra, into the bladder. Cystoscopy may be recommended if you have:  Urinary tract infections that keep coming back.  Blood in the urine (hematuria).  An inability to control when you urinate (urinary incontinence) or an overactive bladder.  Unusual cells found in a urine sample.  A blockage in the urethra, such as a urinary stone.  Painful urination.  An abnormality in the bladder found during an intravenous pyelogram (IVP) or CT scan. Cystoscopy may also be done to remove a sample of tissue to be examined under a microscope (biopsy). Tell a health care provider about:  Any allergies you have.  All medicines you are taking, including vitamins, herbs, eye drops, creams, and over-the-counter medicines.  Any problems you or family members have had with anesthetic medicines.  Any blood disorders you have.  Any surgeries you have had.  Any medical conditions you have.  Whether you are pregnant or may be pregnant. What are the risks? Generally, this is a safe procedure. However, problems may occur, including:  Infection.  Bleeding.  Allergic reactions to medicines.  Damage to other structures or organs. What happens before the procedure? Medicines Ask your health care provider about:  Changing or stopping your regular medicines. This is especially important if you are taking diabetes medicines or blood thinners.  Taking medicines such as aspirin and ibuprofen. These medicines can thin your blood. Do  not take these medicines unless your health care provider tells you to take them.  Taking over-the-counter medicines, vitamins, herbs, and supplements. Tests You may have an exam or testing, such as:  X-rays of the bladder, urethra, or kidneys.  CT scan of the abdomen or pelvis.  Urine tests to check for signs of infection. General instructions  Follow instructions from your health care provider about eating or drinking restrictions.  Ask your health care provider what steps will be taken to help prevent infection. These steps may include: ? Washing skin with a germ-killing soap. ? Taking antibiotic medicine.  Plan to have a responsible adult take you home from the hospital or clinic. What happens during the procedure?  You will be given one or more of the following: ? A medicine to help you relax (sedative). ? A medicine to numb the area (local anesthetic).  The area around the opening of your urethra will be cleaned.  The cystoscope will be passed through your urethra into your bladder.  Germ-free (sterile) fluid will flow through the cystoscope to fill your bladder. The fluid will stretch your bladder so that your health care provider can clearly examine your bladder walls.  Your doctor will look at the urethra and bladder. Your doctor may take a biopsy or remove stones.  The cystoscope will be removed, and your bladder will be emptied. The procedure may vary among health care providers and hospitals.   What can I expect after the procedure? After the procedure, it is common to have:  Some soreness or pain in your abdomen and urethra.  Urinary symptoms. These include: ? Mild pain or burning  when you urinate. Pain should stop within a few minutes after you urinate. This may last for up to 1 week. ? A small amount of blood in your urine for several days. ? Feeling like you need to urinate but producing only a small amount of urine. Follow these instructions at  home: Medicines  Take over-the-counter and prescription medicines only as told by your health care provider.  If you were prescribed an antibiotic medicine, take it as told by your health care provider. Do not stop taking the antibiotic even if you start to feel better. General instructions  Return to your normal activities as told by your health care provider. Ask your health care provider what activities are safe for you.  If you were given a sedative during the procedure, it can affect you for several hours. Do not drive or operate machinery until your health care provider says that it is safe.  Watch for any blood in your urine. If the amount of blood in your urine increases, call your health care provider.  Follow instructions from your health care provider about eating or drinking restrictions.  If a tissue sample was removed for testing (biopsy) during your procedure, it is up to you to get your test results. Ask your health care provider, or the department that is doing the test, when your results will be ready.  Drink enough fluid to keep your urine pale yellow.  Keep all follow-up visits. This is important. Contact a health care provider if:  You have pain that gets worse or does not get better with medicine, especially pain when you urinate.  You have trouble urinating.  You have more blood in your urine. Get help right away if:  You have blood clots in your urine.  You have abdominal pain.  You have a fever or chills.  You are unable to urinate. Summary  Cystoscopy is a procedure that is used to help diagnose and sometimes treat conditions that affect the lower urinary tract.  Cystoscopy is done using a thin, tube-shaped instrument with a light and camera at the end.  After the procedure, it is common to have some soreness or pain in your abdomen and urethra.  Watch for any blood in your urine. If the amount of blood in your urine increases, call your health  care provider.  If you were prescribed an antibiotic medicine, take it as told by your health care provider. Do not stop taking the antibiotic even if you start to feel better. This information is not intended to replace advice given to you by your health care provider. Make sure you discuss any questions you have with your health care provider. Document Revised: 01/22/2020 Document Reviewed: 01/22/2020 Elsevier Patient Education  [place].

## 2020-10-13 NOTE — Progress Notes (Signed)
   10/13/2020 2:07 PM   Thomas Mcdaniel [birth year] [id number]  Reason for visit: Follow up urinary symptoms, BPH/OAB  HPI: I saw Thomas Mcdaniel back in urology clinic for persistent urinary symptoms.  He was previously followed by [facility] urology.  He has been on maximal medical therapy with Flomax and finasteride long-term.  I have tried him on both Myrbetriq and trospium, as his primary urinary complaints are urinary frequency urgency, and occasional urge incontinence, as well as nocturia 4-5 times per night.  PVRs have always been normal.  He noticed upset stomach with Myrbetriq and discontinued this medication, and did not have any improvement in his urinary symptoms during the few days he was taking it.  Most recently, we trialed trospium 60 mg daily, and he denies any improvement in his symptoms on this medication.  He denies any gross hematuria, dysuria, or UTIs.  Urinalyses have all been benign.  He has significant problems with constipation, and may go 2 to 3 days without a bowel movement.  Colonoscopy last year showed diverticulosis, but was otherwise benign.  He drinks prune juice to help with his bowels.  He otherwise just drinks water during the day.  He does think that his constipation and urinary symptoms are associated.  IPSS score today is 19, with quality of life unhappy, and PVR is normal again at 22 mL.  I recommended further evaluation with cystoscopy and TRUS for better evaluation of his urinary symptoms and to evaluate any bladder lesions, prostate obstruction, or urethral strictures.   Billey Co, [facility] [address], [place], [place] [address] [phone]

## 2020-10-19 ENCOUNTER — Other Ambulatory Visit: Payer: Self-pay

## 2020-10-19 ENCOUNTER — Encounter: Payer: Self-pay | Admitting: Urology

## 2020-10-19 ENCOUNTER — Ambulatory Visit (INDEPENDENT_AMBULATORY_CARE_PROVIDER_SITE_OTHER): Payer: Medicare Other | Admitting: Urology

## 2020-10-19 VITALS — BP 122/73 | HR 73 | Ht 68.0 in | Wt 248.0 lb

## 2020-10-19 DIAGNOSIS — N138 Other obstructive and reflux uropathy: Secondary | ICD-10-CM | POA: Diagnosis not present

## 2020-10-19 DIAGNOSIS — R3129 Other microscopic hematuria: Secondary | ICD-10-CM

## 2020-10-19 DIAGNOSIS — N401 Enlarged prostate with lower urinary tract symptoms: Secondary | ICD-10-CM | POA: Diagnosis not present

## 2020-10-19 MED ORDER — LIDOCAINE HCL URETHRAL/MUCOSAL 2 % EX GEL
1.0000 "application " | Freq: Once | CUTANEOUS | Status: AC
  Administered 2020-10-19: 1 via URETHRAL

## 2020-10-19 NOTE — Progress Notes (Signed)
Cystoscopy Procedure Note:  Indication: Urinary symptoms-frequency/urgency/urge incontinence/nocturia  Urinalysis today shows new microscopic hematuria with greater than 30 RBCs, otherwise benign  Has been on Flomax and finasteride long-term.  No improvement on OAB medications Myrbetriq or trospium.  After informed consent and discussion of the procedure and its risks, Thomas Mcdaniel was positioned and prepped in the standard fashion. Cystoscopy was performed with a flexible cystoscope. The urethra, bladder neck and entire bladder was visualized in a standard fashion. The prostate was small, no median lobe. The ureteral orifices were visualized in their normal location and orientation.  Mild to moderate bladder trabeculations, no suspicious lesions problem.  No abnormalities on retroflexion  Imaging: None to review  Findings: Normal cystoscopy  Assessment and Plan: Normal cystoscopy today.  We discussed his new diagnosis of microscopic hematuria and need for further evaluation with CT.  CT urogram ordered, will call with results.  If CT urogram is normal, consider UroLift.  Nickolas Madrid, MD 10/19/2020

## 2020-10-21 LAB — URINALYSIS, COMPLETE
Bilirubin, UA: NEGATIVE
Glucose, UA: NEGATIVE
Ketones, UA: NEGATIVE
Leukocytes,UA: NEGATIVE
Nitrite, UA: NEGATIVE
Protein,UA: NEGATIVE
Specific Gravity, UA: 1.02 (ref 1.005–1.030)
Urobilinogen, Ur: 0.2 mg/dL (ref 0.2–1.0)
pH, UA: 5 (ref 5.0–7.5)

## 2020-10-21 LAB — MICROSCOPIC EXAMINATION
Bacteria, UA: NONE SEEN
RBC, Urine: >30 /hpf — AB (ref 0–2)

## 2020-10-26 ENCOUNTER — Other Ambulatory Visit: Payer: Self-pay | Admitting: Gastroenterology

## 2020-10-26 DIAGNOSIS — K5909 Other constipation: Secondary | ICD-10-CM

## 2020-10-26 DIAGNOSIS — R1084 Generalized abdominal pain: Secondary | ICD-10-CM

## 2020-11-04 ENCOUNTER — Ambulatory Visit: Admission: RE | Admit: 2020-11-04 | Payer: Medicare Other | Source: Ambulatory Visit

## 2020-11-11 ENCOUNTER — Ambulatory Visit: Payer: Medicare Other

## 2020-12-09 ENCOUNTER — Other Ambulatory Visit: Payer: Self-pay

## 2020-12-09 ENCOUNTER — Ambulatory Visit
Admission: RE | Admit: 2020-12-09 | Discharge: 2020-12-09 | Disposition: A | Discharge status: Home / Self Care | Payer: Medicare Other | Source: Ambulatory Visit | Attending: Urology | Admitting: Urology

## 2020-12-09 DIAGNOSIS — R3129 Other microscopic hematuria: Secondary | ICD-10-CM | POA: Diagnosis not present

## 2020-12-09 MED ORDER — IOHEXOL 300 MG/ML  SOLN
125.0000 mL | Freq: Once | INTRAMUSCULAR | Status: AC | PRN
  Administered 2020-12-09: 125 mL via INTRAVENOUS

## 2020-12-13 ENCOUNTER — Telehealth: Payer: Self-pay | Admitting: Urology

## 2020-12-13 ENCOUNTER — Other Ambulatory Visit: Payer: Self-pay | Admitting: Urology

## 2020-12-13 DIAGNOSIS — N201 Calculus of ureter: Secondary | ICD-10-CM

## 2020-12-13 NOTE — Telephone Encounter (Signed)
Urology telephone note  75 year old male with irritative urinary symptoms and abdominal discomfort-microscopic hematuria prompted CT urogram which shows a 6 mm right distal ureteral stone with upstream hydronephrosis, and this is the likely etiology of his symptoms.  I recommended right ureteroscopy, laser lithotripsy, stent placement. We specifically discussed the risks ureteroscopy including bleeding, infection/sepsis, stent related symptoms including flank pain/urgency/frequency/incontinence/dysuria, ureteral injury, inability to access stone, or need for staged or additional procedures.  Schedule right ureteroscopy, laser lithotripsy, stent placement   Nickolas Madrid, MD 12/13/2020

## 2020-12-15 ENCOUNTER — Encounter
Admission: RE | Admit: 2020-12-15 | Discharge: 2020-12-15 | Disposition: A | Discharge status: Home / Self Care | Payer: Medicare Other | Source: Ambulatory Visit | Attending: Urology | Admitting: Urology

## 2020-12-15 ENCOUNTER — Other Ambulatory Visit: Payer: Self-pay

## 2020-12-15 HISTORY — DX: Personal history of other diseases of the digestive system: Z87.19

## 2020-12-15 HISTORY — DX: Personal history of urinary calculi: Z87.442

## 2020-12-15 HISTORY — DX: Malignant (primary) neoplasm, unspecified: C80.1

## 2020-12-15 MED ORDER — APREPITANT 40 MG PO CAPS
40.0000 mg | ORAL_CAPSULE | Freq: Once | ORAL | Status: DC
  Filled 2020-12-15: qty 1

## 2020-12-15 MED ORDER — FAMOTIDINE 20 MG PO TABS: 20.0000 mg | ORAL_TABLET | Freq: Once | ORAL | Status: AC

## 2020-12-15 MED ORDER — LACTATED RINGERS IV SOLN: INTRAVENOUS | Status: DC

## 2020-12-15 MED ORDER — ORAL CARE MOUTH RINSE: 15.0000 mL | Freq: Once | OROMUCOSAL | Status: AC

## 2020-12-15 MED ORDER — CHLORHEXIDINE GLUCONATE 0.12 % MT SOLN: 15.0000 mL | Freq: Once | OROMUCOSAL | Status: AC

## 2020-12-15 MED ORDER — CEFAZOLIN SODIUM-DEXTROSE 2-4 GM/100ML-% IV SOLN
2.0000 g | INTRAVENOUS | Status: AC
  Administered 2020-12-16: 2 g via INTRAVENOUS

## 2020-12-15 NOTE — Patient Instructions (Signed)
Your procedure is scheduled on:12-16-20 Friday Report to the Registration Desk on the 1st floor of the [facility]-Then proceed to the 2nd floor Surgery Desk in the [facility] To find out your arrival time, please call [phone] between 1PM - 3PM on:12-15-20 Thursday  REMEMBER: Instructions that are not followed completely may result in serious medical risk, up to and including death; or upon the discretion of your surgeon and anesthesiologist your surgery may need to be rescheduled.  Do not eat food or drink any liquids after midnight the night before surgery.  No gum chewing, lozengers or hard candies.  Do not take any medication the morning of surgery One week prior to surgery: Stop Anti-inflammatories (NSAIDS) such as Advil, Aleve, Ibuprofen, Motrin, Naproxen, Naprosyn and Aspirin based products such as Excedrin, Goodys Powder, BC Powder. You may however, continue to take Tylenol if needed for pain up until the day of surgery.  No Alcohol for 24 hours before or after surgery.  No Smoking including e-cigarettes for 24 hours prior to surgery.  No chewable tobacco products for at least 6 hours prior to surgery.  No nicotine patches on the day of surgery.  Do not use any "recreational" drugs for at least a week prior to your surgery.  Please be advised that the combination of cocaine and anesthesia may have negative outcomes, up to and including death. If you test positive for cocaine, your surgery will be cancelled.  On the morning of surgery brush your teeth with toothpaste and water, you may rinse your mouth with mouthwash if you wish. Do not swallow any toothpaste or mouthwash.  Do not wear jewelry, make-up, hairpins, clips or nail polish.  Do not wear lotions, powders, or perfumes.   Do not shave body from the neck down 48 hours prior to surgery just in case you cut yourself which could leave a site for infection.  Also, freshly shaved skin may become irritated if using  the CHG soap.  Contact lenses, hearing aids and dentures may not be worn into surgery.  Do not bring valuables to the hospital. [facility] is not responsible for any missing/lost belongings or valuables.   Notify your doctor if there is any change in your medical condition (cold, fever, infection).  Wear comfortable clothing (specific to your surgery type) to the hospital.  After surgery, you can help prevent lung complications by doing breathing exercises.  Take deep breaths and cough every 1-2 hours. Your doctor may order a device called an Incentive Spirometer to help you take deep breaths. When coughing or sneezing, hold a pillow firmly against your incision with both hands. This is called "splinting." Doing this helps protect your incision. It also decreases belly discomfort.  If you are being admitted to the hospital overnight, leave your suitcase in the car. After surgery it may be brought to your room.  If you are being discharged the day of surgery, you will not be allowed to drive home. You will need a responsible adult (18 years or older) to drive you home and stay with you that night.   If you are taking public transportation, you will need to have a responsible adult (18 years or older) with you. Please confirm with your physician that it is acceptable to use public transportation.   Please call the [facility] at [phone] if you have any questions about these instructions.  Surgery Visitation Policy:  Patients undergoing a surgery or procedure may have one family member  or support person with them as long as that person is not COVID-19 positive or experiencing its symptoms.  That person may remain in the waiting area during the procedure.  Inpatient Visitation:    Visiting hours are 7 a.m. to 8 p.m. Inpatients will be allowed two visitors daily. The visitors may change each day during the patient's stay. No visitors under the age of 40. Any  visitor under the age of 59 must be accompanied by an adult. The visitor must pass COVID-19 screenings, use hand sanitizer when entering and exiting the patient's room and wear a mask at all times, including in the patient's room. Patients must also wear a mask when staff or their visitor are in the room. Masking is required regardless of vaccination status.

## 2020-12-16 ENCOUNTER — Ambulatory Visit: Payer: Medicare Other

## 2020-12-16 ENCOUNTER — Ambulatory Visit: Payer: Medicare Other | Admitting: Anesthesiology

## 2020-12-16 ENCOUNTER — Other Ambulatory Visit: Payer: Self-pay

## 2020-12-16 ENCOUNTER — Encounter
Admission: RE | Disposition: A | Discharge status: Home / Self Care | Payer: Self-pay | Source: Home / Self Care | Attending: Urology

## 2020-12-16 ENCOUNTER — Ambulatory Visit
Admission: RE | Admit: 2020-12-16 | Discharge: 2020-12-16 | Disposition: A | Discharge status: Home / Self Care | Payer: Medicare Other | Attending: Urology | Admitting: Urology

## 2020-12-16 ENCOUNTER — Encounter: Payer: Self-pay | Admitting: Urology

## 2020-12-16 DIAGNOSIS — Z87891 Personal history of nicotine dependence: Secondary | ICD-10-CM | POA: Insufficient documentation

## 2020-12-16 DIAGNOSIS — N201 Calculus of ureter: Secondary | ICD-10-CM

## 2020-12-16 DIAGNOSIS — N132 Hydronephrosis with renal and ureteral calculous obstruction: Secondary | ICD-10-CM | POA: Insufficient documentation

## 2020-12-16 DIAGNOSIS — N202 Calculus of kidney with calculus of ureter: Secondary | ICD-10-CM | POA: Diagnosis present

## 2020-12-16 DIAGNOSIS — N200 Calculus of kidney: Secondary | ICD-10-CM | POA: Diagnosis not present

## 2020-12-16 HISTORY — PX: CYSTOSCOPY/URETEROSCOPY/HOLMIUM LASER/STENT PLACEMENT: SHX6546

## 2020-12-16 SURGERY — CYSTOSCOPY/URETEROSCOPY/HOLMIUM LASER/STENT PLACEMENT
Anesthesia: General | Laterality: Right

## 2020-12-16 MED ORDER — CHLORHEXIDINE GLUCONATE 0.12 % MT SOLN
OROMUCOSAL | Status: AC
  Administered 2020-12-16: 15 mL via OROMUCOSAL
  Filled 2020-12-16: qty 15

## 2020-12-16 MED ORDER — FAMOTIDINE 20 MG PO TABS
ORAL_TABLET | ORAL | Status: AC
  Administered 2020-12-16: 20 mg via ORAL
  Filled 2020-12-16: qty 1

## 2020-12-16 MED ORDER — ONDANSETRON HCL 4 MG/2ML IJ SOLN: 4.0000 mg | Freq: Once | INTRAMUSCULAR | Status: DC | PRN

## 2020-12-16 MED ORDER — SUGAMMADEX SODIUM 200 MG/2ML IV SOLN
INTRAVENOUS | Status: DC | PRN
  Administered 2020-12-16: 200 mg via INTRAVENOUS

## 2020-12-16 MED ORDER — FENTANYL CITRATE (PF) 100 MCG/2ML IJ SOLN
INTRAMUSCULAR | Status: DC | PRN
  Administered 2020-12-16 (×2): 50 ug via INTRAVENOUS

## 2020-12-16 MED ORDER — LACTATED RINGERS IV SOLN: INTRAVENOUS | Status: DC | PRN

## 2020-12-16 MED ORDER — CEFAZOLIN SODIUM-DEXTROSE 2-4 GM/100ML-% IV SOLN
INTRAVENOUS | Status: AC
  Filled 2020-12-16: qty 100

## 2020-12-16 MED ORDER — EPHEDRINE SULFATE 50 MG/ML IJ SOLN
INTRAMUSCULAR | Status: DC | PRN
  Administered 2020-12-16 (×2): 10 mg via INTRAVENOUS

## 2020-12-16 MED ORDER — FENTANYL CITRATE (PF) 100 MCG/2ML IJ SOLN: 25.0000 ug | INTRAMUSCULAR | Status: DC | PRN

## 2020-12-16 MED ORDER — MEPERIDINE HCL 25 MG/ML IJ SOLN: 6.2500 mg | INTRAMUSCULAR | Status: DC | PRN

## 2020-12-16 MED ORDER — IOHEXOL 180 MG/ML  SOLN
INTRAMUSCULAR | Status: DC | PRN
  Administered 2020-12-16: 5 mL

## 2020-12-16 MED ORDER — BELLADONNA ALKALOIDS-OPIUM 16.2-60 MG RE SUPP
RECTAL | Status: DC | PRN
  Administered 2020-12-16: 1 via RECTAL

## 2020-12-16 MED ORDER — PROPOFOL 10 MG/ML IV BOLUS
INTRAVENOUS | Status: DC | PRN
  Administered 2020-12-16: 200 mg via INTRAVENOUS

## 2020-12-16 MED ORDER — PROPOFOL 10 MG/ML IV BOLUS
INTRAVENOUS | Status: AC
  Filled 2020-12-16: qty 20

## 2020-12-16 MED ORDER — LIDOCAINE HCL (CARDIAC) PF 100 MG/5ML IV SOSY
PREFILLED_SYRINGE | INTRAVENOUS | Status: DC | PRN
  Administered 2020-12-16: 100 mg via INTRAVENOUS

## 2020-12-16 MED ORDER — BELLADONNA ALKALOIDS-OPIUM 16.2-60 MG RE SUPP
RECTAL | Status: AC
  Filled 2020-12-16: qty 1

## 2020-12-16 MED ORDER — ONDANSETRON HCL 4 MG/2ML IJ SOLN
INTRAMUSCULAR | Status: DC | PRN
  Administered 2020-12-16: 4 mg via INTRAVENOUS

## 2020-12-16 MED ORDER — FENTANYL CITRATE (PF) 100 MCG/2ML IJ SOLN
INTRAMUSCULAR | Status: AC
  Filled 2020-12-16: qty 2

## 2020-12-16 MED ORDER — ROCURONIUM BROMIDE 100 MG/10ML IV SOLN
INTRAVENOUS | Status: DC | PRN
  Administered 2020-12-16: 50 mg via INTRAVENOUS

## 2020-12-16 MED ORDER — DEXAMETHASONE SODIUM PHOSPHATE 10 MG/ML IJ SOLN
INTRAMUSCULAR | Status: DC | PRN
  Administered 2020-12-16: 10 mg via INTRAVENOUS

## 2020-12-16 MED ORDER — HYDROCODONE-ACETAMINOPHEN 5-325 MG PO TABS: 1.0000 | ORAL_TABLET | Freq: Four times a day (QID) | ORAL | 0 refills | Status: AC | PRN

## 2020-12-16 MED ORDER — PHENYLEPHRINE HCL (PRESSORS) 10 MG/ML IV SOLN
INTRAVENOUS | Status: DC | PRN
  Administered 2020-12-16: 200 ug via INTRAVENOUS

## 2020-12-16 MED ORDER — SODIUM CHLORIDE 0.9 % IR SOLN
Status: DC | PRN
  Administered 2020-12-16: 1500 mL

## 2020-12-16 SURGICAL SUPPLY — 31 items
ADH LQ OCL WTPRF AMP STRL LF (MISCELLANEOUS) ×1
ADHESIVE MASTISOL STRL (MISCELLANEOUS) ×3 IMPLANT
BAG DRAIN CYSTO-URO LG1000N (MISCELLANEOUS) ×3 IMPLANT
BRUSH SCRUB EZ 1% IODOPHOR (MISCELLANEOUS) ×3 IMPLANT
CATH URET FLEX-TIP 2 LUMEN 10F (CATHETERS) IMPLANT
CATH URETL 5X70 OPEN END (CATHETERS) IMPLANT
CNTNR SPEC 2.5X3XGRAD LEK (MISCELLANEOUS)
CONT SPEC 4OZ STER OR WHT (MISCELLANEOUS)
CONT SPEC 4OZ STRL OR WHT (MISCELLANEOUS)
CONTAINER SPEC 2.5X3XGRAD LEK (MISCELLANEOUS) IMPLANT
DRAPE UTILITY 15X26 TOWEL STRL (DRAPES) ×3 IMPLANT
DRSG TEGADERM 2-3/8X2-3/4 SM (GAUZE/BANDAGES/DRESSINGS) ×3 IMPLANT
GLOVE SURG UNDER POLY LF SZ7.5 (GLOVE) ×3 IMPLANT
GOWN STRL REUS W/ TWL LRG LVL3 (GOWN DISPOSABLE) ×1 IMPLANT
GOWN STRL REUS W/ TWL XL LVL3 (GOWN DISPOSABLE) ×1 IMPLANT
GOWN STRL REUS W/TWL LRG LVL3 (GOWN DISPOSABLE) ×3
GOWN STRL REUS W/TWL XL LVL3 (GOWN DISPOSABLE) ×3
GUIDEWIRE STR DUAL SENSOR (WIRE) ×3 IMPLANT
INFUSOR MANOMETER BAG 3000ML (MISCELLANEOUS) ×3 IMPLANT
IV NS IRRIG 3000ML ARTHROMATIC (IV SOLUTION) ×3 IMPLANT
KIT TURNOVER CYSTO (KITS) ×3 IMPLANT
PACK CYSTO AR (MISCELLANEOUS) ×3 IMPLANT
SET CYSTO W/LG BORE CLAMP LF (SET/KITS/TRAYS/PACK) ×3 IMPLANT
SHEATH URETERAL 12FRX35CM (MISCELLANEOUS) IMPLANT
STENT URET 6FRX24 CONTOUR (STENTS) IMPLANT
STENT URET 6FRX26 CONTOUR (STENTS) ×3 IMPLANT
SURGILUBE 2OZ TUBE FLIPTOP (MISCELLANEOUS) ×3 IMPLANT
SYR 10ML LL (SYRINGE) ×3 IMPLANT
TRACTIP FLEXIVA PULSE ID 200 (Laser) ×3 IMPLANT
VALVE UROSEAL ADJ ENDO (VALVE) IMPLANT
WATER STERILE IRR 1000ML POUR (IV SOLUTION) ×3 IMPLANT

## 2020-12-16 NOTE — Op Note (Signed)
Date of procedure: 12/16/20  Preoperative diagnosis:  Right distal ureteral stone Right renal stones  Postoperative diagnosis:  Right renal stones  Procedure: Cystoscopy, right ureteroscopy, laser lithotripsy, right retrograde pyelogram with intraoperative interpretation, right ureteral stent placement  Surgeon: Nickolas Madrid, MD  Anesthesia: General  Complications: None  Intraoperative findings:  Small open prostate, normal cystoscopy Right distal ureteral stone had passed spontaneously, all right renal stones dusted Uncomplicated stent placement on Dangler  EBL: Minimal  Specimens: None  Drains: Right 6 French by 26 cm ureteral stent with Dangler  Indication: Thomas Mcdaniel is a 75 y.o. patient with 6 mm right distal ureteral stone and flank/abdominal pain, as well as multiple right renal stones.  After reviewing the management options for treatment, they elected to proceed with the above surgical procedure(s). We have discussed the potential benefits and risks of the procedure, side effects of the proposed treatment, the likelihood of the patient achieving the goals of the procedure, and any potential problems that might occur during the procedure or recuperation. Informed consent has been obtained.  Description of procedure:  The patient was taken to the operating room and general anesthesia was induced. SCDs were placed for DVT prophylaxis. The patient was placed in the dorsal lithotomy position, prepped and draped in the usual sterile fashion, and preoperative antibiotics(Ancef) were administered. A preoperative time-out was performed.   A 21 French rigid cystoscope was used to intubate the urethra and a normal-appearing urethra was followed proximally in the bladder.  The prostate was short and small and appeared widely open.  The bladder was grossly normal with no suspicious lesions, and the ureteral orifices were orthotopic bilaterally.  A sensor wire advanced easily  into the right ureteral orifice and passed up to the kidney under fluoroscopic vision.  A semirigid ureteroscope advanced easily into the ureter and thorough ureteroscopy showed no evidence of stone up to the proximal ureter.  I suspect the 6 mm stone had passed spontaneously.  I then advanced the single channel digital flexible ureteroscope and this passed easily up into the kidney under fluoroscopic vision.  There were at least four to five 3-7mm black stones in the renal calyces consistent with calcium oxalate.  A 242 m laser fiber on settings of 0.3 J and 60 Hz were used to methodically fragment the stones to dust.  Thorough pyeloscopy revealed no other fragments.  A retrograde pyelogram was performed from the proximal ureter and showed no hydronephrosis or filling defects.    The sensor wire was replaced through the scope, and pullback ureteroscopy showed no other stones.  The rigid cystoscope was backloaded over the wire and a 6 [place] by 26 cm stent was uneventfully placed with an excellent curl in the upper pole as well as under direct vision the bladder.  The bladder was drained.  The stent was secured to the phallus using Tegaderm, and a belladonna suppository was placed.  Disposition: Stable to PACU  Plan: Stent removal at home on Tuesday Consider nocdurna in the future if persistent nocturia  Nickolas Madrid, MD

## 2020-12-16 NOTE — Anesthesia Procedure Notes (Signed)
Procedure Name: Intubation Date/Time: 12/16/2020 2:12 PM Performed by: Philbert Riser, CRNA Pre-anesthesia Checklist: Patient identified, Emergency Drugs available, Suction available and Patient being monitored Patient Re-evaluated:Patient Re-evaluated prior to induction Oxygen Delivery Method: Circle system utilized Preoxygenation: Pre-oxygenation with 100% oxygen Induction Type: IV induction Ventilation: Mask ventilation without difficulty Laryngoscope Size: McGraph and 3 Grade View: Grade II Tube type: Oral Tube size: 7.5 mm Number of attempts: 1 Airway Equipment and Method: Stylet and Oral airway Placement Confirmation: ETT inserted through vocal cords under direct vision, positive ETCO2 and breath sounds checked- equal and bilateral Secured at: 22 cm Tube secured with: Tape Dental Injury: Teeth and Oropharynx as per pre-operative assessment

## 2020-12-16 NOTE — Discharge Instructions (Signed)
AMBULATORY SURGERY  ?DISCHARGE INSTRUCTIONS ? ? ?The drugs that you were given will stay in your system until tomorrow so for the next 24 hours you should not: ? ?Drive an automobile ?Make any legal decisions ?Drink any alcoholic beverage ? ? ?You may resume regular meals tomorrow.  Today it is better to start with liquids and gradually work up to solid foods. ? ?You may eat anything you prefer, but it is better to start with liquids, then soup and crackers, and gradually work up to solid foods. ? ? ?Please notify your doctor immediately if you have any unusual bleeding, trouble breathing, redness and pain at the surgery site, drainage, fever, or pain not relieved by medication. ? ? ? ?Additional Instructions: ? ? ? ?Please contact your physician with any problems or Same Day Surgery at [phone], Monday through Friday 6 am to 4 pm, or [place] [facility] number at [phone].  ?

## 2020-12-16 NOTE — Transfer of Care (Signed)
Immediate Anesthesia Transfer of Care Note  Patient: Thomas Mcdaniel  Procedure(s) Performed: CYSTOSCOPY/URETEROSCOPY/HOLMIUM LASER/STENT PLACEMENT (Right)  Patient Location: PACU  Anesthesia Type:General  Level of Consciousness: drowsy  Airway & Oxygen Therapy: Patient Spontanous Breathing and Patient connected to face mask oxygen  Post-op Assessment: Report given to RN  Post vital signs: stable  Last Vitals:  Vitals Value Taken Time  BP 180/89 12/16/20 1455  Temp 36.5 C 12/16/20 1455  Pulse 78 12/16/20 1458  Resp 18 12/16/20 1458  SpO2 99 % 12/16/20 1458  Vitals shown include unvalidated device data.  Last Pain:  Vitals:   12/16/20 1256  TempSrc: Temporal  PainSc: 0-No pain         Complications: No notable events documented.

## 2020-12-16 NOTE — H&P (Signed)
12/16/20 1:38 PM   Lurlean Horns Nov 24, 1945 [id number]  CC: Right ureteral stone  HPI: 75 year old male with long history of urinary symptoms on maximal medical therapy.  He had worsening lower abdominal pain, back pain, and urgency/frequency as well as microscopic hematuria and CT showed a 6 mm right distal ureteral stone with upstream hydronephrosis.   PMH: Past Medical History:  Diagnosis Date   Acquired spondylolisthesis    BPH (benign prostatic hyperplasia)    Bradycardia    Burn    Bursitis    right elbow    Cancer ([facility])    skin cancer   Complication of anesthesia    difficult to wake up after a lithotripsy   DJD (degenerative joint disease)    Foot drop    bil.   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    Nephrolithiasis    Obesity    Peripheral polyneuropathy    Personal history of PE (pulmonary embolism)    PONV (postoperative nausea and vomiting)    after cataract surgery   Pre-diabetes    Scoliosis    Sleep apnea    pt denies   SOB (shortness of breath)    Spinal stenosis, lumbar region without neurogenic claudication    Urine frequency    UTI (urinary tract infection) during pregnancy, unspecified trimester     Surgical History: Past Surgical History:  Procedure Laterality Date   CATARACT EXTRACTION     CATARACT EXTRACTION W/PHACO Right 08/25/2019   Procedure: CATARACT EXTRACTION PHACO AND INTRAOCULAR LENS PLACEMENT ([place]) MALYUGIN RIGHT 7.94  00:48.5;  Surgeon: Birder Robson, MD;  Location: [facility];  Service: Ophthalmology;  Laterality: Right;   COLONOSCOPY WITH PROPOFOL N/A  08/01/2015   Procedure: COLONOSCOPY WITH PROPOFOL;  Surgeon: Josefine Class, MD;  Location: [facility] END[facility]COPY;  Service: Endoscopy;  Laterality: N/A;   COLONOSCOPY WITH PROPOFOL N/A 09/25/2019   Procedure: COLONOSCOPY WITH PROPOFOL;  Surgeon: Robert Bellow, MD;  Location: [facility] ENDOSCOPY;  Service: Endoscopy;  Laterality: N/A;   DIAGNOSTIC LAPAROSCOPY     FEMORAL ARTERY REPAIR     HERNIA REPAIR     JOINT REPLACEMENT Right    knee   KNEE ARTHROPLASTY Right 12/05/2015   Procedure: COMPUTER ASSISTED TOTAL KNEE ARTHROPLASTY;  Surgeon: Dereck Leep, MD;  Location: [facility] ORS;  Service: Orthopedics;  Laterality: Right;   knee arthroscopy     LAMINECTOMY     LITHOTRIPSY     LITHOTRIPSY     SPINAL FUSION     TOE AMPUTATION     TONSILLECTOMY        Family History: Family History  Problem Relation Age of Onset   Prostate cancer Neg Hx    Kidney disease Neg Hx     Social History:  reports that he quit smoking about 28 years ago. His smoking use included cigarettes. He has a 51.00 pack-year smoking history. He has never used smokeless tobacco. He reports that he does not drink alcohol and does not use drugs.  Physical Exam: BP (!) 156/85  Pulse 70   Temp (!) 97.5 F (36.4 C) (Temporal)   Resp 16   Ht 5\' 9"  (1.753 m)   Wt 108.9 kg   SpO2 95%   BMI 35.44 kg/m    Constitutional:  Alert and oriented, No acute distress. Cardiovascular: Regular rate and rhythm Respiratory: Clear to auscultation bilaterally GI: Abdomen is soft, nontender, nondistended, no abdominal masses  Assessment & Plan:   75 year old male with long history of urinary symptoms including urgency, frequency, nocturia with new lower abdominal and flank pain and microscopic hematuria with CT showing right distal ureteral stone.  We discussed the possible relationship between the distal ureteral stone and urinary symptoms.  If persistently bothersome nocturia or urinary symptoms despite stone treatment consider  either No[place]urna or UroLift in the future.  We specifically discussed the risks ureteroscopy including bleeding, infection/sepsis, stent related symptoms including flank pain/urgency/frequency/incontinence/dysuria, ureteral injury, inability to access stone, or need for staged or additional procedures.  Right ureteroscopy, laser lithotripsy, stent placement today  Nickolas Madrid, MD 12/16/2020  [facility] [address], [place], [place] [address] [phone]

## 2020-12-16 NOTE — Anesthesia Preprocedure Evaluation (Addendum)
Anesthesia Evaluation  Patient identified by MRN, date of birth, ID band Patient awake    Reviewed: Allergy & Precautions, NPO status , Patient's Chart, lab work & pertinent test results  History of Anesthesia Complications (+) PONV and history of anesthetic complications  Airway Mallampati: III  TM Distance: >3 FB Neck ROM: Full    Dental  (+) Poor Dentition   Pulmonary sleep apnea , neg COPD, former smoker,    breath sounds clear to auscultation- rhonchi (-) wheezing      Cardiovascular hypertension, Pt. on medications (-) CAD, (-) Past MI, (-) Cardiac Stents and (-) CABG  Rhythm:Regular Rate:Normal - Systolic murmurs and - Diastolic murmurs    Neuro/Psych neg Seizures  Neuromuscular disease negative psych ROS   GI/Hepatic Neg liver ROS, hiatal hernia, GERD  ,  Endo/Other  negative endocrine ROSneg diabetes  Renal/GU Renal disease (hx of nephrolithiasis)     Musculoskeletal  (+) Arthritis ,   Abdominal (+) + obese,   Peds  Hematology negative hematology ROS (+)   Anesthesia Other Findings Acquired spondylolisthesis   BPH (benign prostatic hyperplasia)  Bradycardia    Burn    Bursitis  right elbow   Cancer ([facility])  skin cancer  Complication of anesthesia  difficult to wake up after a lithotripsy DJD (degenerative joint disease)  Foot drop  bil.  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    Nephrolithiasis    Obesity    Peripheral polyneuropathy    Personal history of PE (pulmonary embolism)    PONV (postoperative nausea and vomiting)  after cataract  surgery  Pre-diabetes    Scoliosis    Sleep apnea  pt denies  SOB (shortness of breath)    Spinal stenosis, lumbar region without neurogenic claudication    Urine frequency    UTI (urinary tract infection) during pregnancy, unspecified trimester       Reproductive/Obstetrics                             Anesthesia Physical  Anesthesia Plan  ASA: 3  Anesthesia Plan: General   Post-op Pain Management:    Induction: Intravenous  PONV Risk Score and Plan: 2 and Propofol infusion  Airway Management Planned: Oral ETT  Additional Equipment:   Intra-op Plan:   Post-operative Plan: Extubation in OR  Informed Consent: I have reviewed the patients History and Physical, chart, labs and discussed the procedure including the risks, benefits and alternatives for the proposed anesthesia with the patient or authorized representative who has indicated his/her understanding and acceptance.     Dental advisory given  Plan Discussed with: CRNA, Anesthesiologist and Surgeon  Anesthesia Plan Comments:         Anesthesia Quick Evaluation

## 2020-12-17 ENCOUNTER — Emergency Department
Admission: EM | Admit: 2020-12-17 | Discharge: 2020-12-17 | Disposition: A | Discharge status: Home / Self Care | Payer: Medicare Other | Attending: Emergency Medicine | Admitting: Emergency Medicine

## 2020-12-17 ENCOUNTER — Other Ambulatory Visit: Payer: Self-pay

## 2020-12-17 ENCOUNTER — Emergency Department: Payer: Medicare Other

## 2020-12-17 ENCOUNTER — Encounter: Payer: Self-pay | Admitting: Emergency Medicine

## 2020-12-17 DIAGNOSIS — Z79899 Other long term (current) drug therapy: Secondary | ICD-10-CM | POA: Insufficient documentation

## 2020-12-17 DIAGNOSIS — I10 Essential (primary) hypertension: Secondary | ICD-10-CM | POA: Insufficient documentation

## 2020-12-17 DIAGNOSIS — R32 Unspecified urinary incontinence: Secondary | ICD-10-CM | POA: Diagnosis not present

## 2020-12-17 DIAGNOSIS — R300 Dysuria: Secondary | ICD-10-CM | POA: Diagnosis not present

## 2020-12-17 DIAGNOSIS — Z4582 Encounter for adjustment or removal of myringotomy device (stent) (tube): Secondary | ICD-10-CM | POA: Diagnosis not present

## 2020-12-17 DIAGNOSIS — Z85828 Personal history of other malignant neoplasm of skin: Secondary | ICD-10-CM | POA: Insufficient documentation

## 2020-12-17 DIAGNOSIS — Z87891 Personal history of nicotine dependence: Secondary | ICD-10-CM | POA: Diagnosis not present

## 2020-12-17 DIAGNOSIS — Z466 Encounter for fitting and adjustment of urinary device: Secondary | ICD-10-CM

## 2020-12-17 LAB — URINALYSIS, COMPLETE (UACMP) WITH MICROSCOPIC
Bacteria, UA: NONE SEEN
Bilirubin Urine: NEGATIVE
Glucose, UA: NEGATIVE mg/dL
Ketones, ur: NEGATIVE mg/dL
Nitrite: NEGATIVE
Protein, ur: 100 mg/dL — AB
RBC / HPF: >50 RBC/hpf — ABNORMAL HIGH (ref 0–5)
Specific Gravity, Urine: 1.014 (ref 1.005–1.030)
pH: 6 (ref 5.0–8.0)

## 2020-12-17 LAB — BASIC METABOLIC PANEL
Anion gap: 7 (ref 5–15)
BUN: 17 mg/dL (ref 8–23)
CO2: 25 mmol/L (ref 22–32)
Calcium: 10 mg/dL (ref 8.9–10.3)
Chloride: 105 mmol/L (ref 98–111)
Creatinine, Ser: 0.95 mg/dL (ref 0.61–1.24)
GFR, Estimated: >60 mL/min (ref >60)
Glucose, Bld: 124 mg/dL — ABNORMAL HIGH (ref 70–99)
Potassium: 3.9 mmol/L (ref 3.5–5.1)
Sodium: 137 mmol/L (ref 135–145)

## 2020-12-17 MED ORDER — CEPHALEXIN 500 MG PO CAPS: 500.0000 mg | ORAL_CAPSULE | Freq: Four times a day (QID) | ORAL | 0 refills | Status: AC

## 2020-12-17 MED ORDER — OXYBUTYNIN CHLORIDE ER 10 MG PO TB24: 10.0000 mg | ORAL_TABLET | Freq: Every day | ORAL | 0 refills | Status: AC

## 2020-12-17 NOTE — ED Triage Notes (Signed)
Pt reports yesterday had a stent placed for kidney stones and he thinks one has slipped out of place because he is leaking urine everywhere. Pt reports called office and was told to come to the ED.

## 2020-12-17 NOTE — ED Provider Notes (Addendum)
[facility] Emergency Department Provider Note  ____________________________________________   Event Date/Time   First MD Initiated Contact with Patient 12/17/20 1210     (approximate)  I have reviewed the triage vital signs and the nursing notes.   HISTORY  Chief Complaint Post-op Problem   HPI Thomas Mcdaniel is a 75 y.o. male with past medical history of BPH, bradycardia, DJD, HDL and kidney stones status post right ureteral stent placed yesterday uncomplicated who presents for assessment of some urinary incontinence and burning with urination.  Patient states he has had burning with urination for couple days but urine incontinence is new he noticed today.  He states he does not have any abdominal or flank pain.  He denies any fevers, chest pain, cough, shortness of breath or other back pain.  States he is little constipated has not had any diarrhea or blood in his urine or stool.  No recent falls injuries rash or other acute complaints at this time.  States he called answering service for his urologist office who told him to come be assessed emergency room and he wants to know if his stent could possibly be displaced.         Past Medical History:  Diagnosis Date   Acquired spondylolisthesis    BPH (benign prostatic hyperplasia)    Bradycardia    Burn    Bursitis    right elbow    Cancer ([place])    skin cancer   Complication of anesthesia    difficult to wake up after a lithotripsy   DJD (degenerative joint disease)    Foot drop    bil.   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     Nephrolithiasis    Obesity    Peripheral polyneuropathy    Personal history of PE (pulmonary embolism)    PONV (postoperative nausea and vomiting)    after cataract surgery   Pre-diabetes    Scoliosis    Sleep apnea    pt denies   SOB (shortness of breath)    Spinal stenosis, lumbar region without neurogenic claudication    Urine frequency    UTI (urinary tract infection) during pregnancy, unspecified trimester     Patient Active Problem List   Diagnosis Date Noted   Altered mental status 12/14/2015   Altered mental state 12/14/2015   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    Past Surgical History:  Procedure Laterality Date   CATARACT EXTRACTION     CATARACT EXTRACTION W/PHACO Right 08/25/2019   Procedure: CATARACT EXTRACTION PHACO AND INTRAOCULAR LENS PLACEMENT ([place]) MALYUGIN RIGHT 7.94  00:48.5;  Surgeon: Birder Robson, MD;  Location: [place];  Service: Ophthalmology;  Laterality: Right;   COLONOSCOPY WITH PROPOFOL N/A 08/01/2015   Procedure: COLONOSCOPY WITH PROPOFOL;  Surgeon: Josefine Class, MD;  Location: [facility] ENDOSCOPY;  Service: Endoscopy;  Laterality: N/A;   COLONOSCOPY WITH PROPOFOL N/A 09/25/2019   Procedure: COLONOSCOPY WITH PROPOFOL;  Surgeon: Robert Bellow, MD;  Location: [facility] ENDOSCOPY;  Service: Endoscopy;  Laterality: N/A;  DIAGNOSTIC LAPAROSCOPY     FEMORAL ARTERY REPAIR     HERNIA REPAIR     JOINT REPLACEMENT Right    knee   KNEE ARTHROPLASTY Right 12/05/2015   Procedure: COMPUTER ASSISTED TOTAL KNEE ARTHROPLASTY;  Surgeon: Dereck Leep, MD;  Location: [facility] ORS;  Service: Orthopedics;  Laterality: Right;   knee arthroscopy     LAMINECTOMY     LITHOTRIPSY     LITHOTRIPSY     SPINAL FUSION     TOE AMPUTATION     TONSILLECTOMY      Prior to Admission medications   Medication Sig Start Date End Date Taking? Authorizing Provider  cephALEXin (KEFLEX) 500 MG capsule Take 1 capsule (500 mg total) by mouth 4 (four) times daily  for 3 days. 12/17/20 12/20/20 Yes Lucrezia Starch, MD  oxybutynin (DITROPAN XL) 10 MG 24 hr tablet Take 1 tablet (10 mg total) by mouth daily for 5 days. 12/17/20 12/22/20 Yes Lucrezia Starch, MD  acetaminophen (TYLENOL) 650 MG CR tablet Take 1,300 mg by mouth every 8 (eight) hours as needed for pain.    [provider]  allopurinol (ZYLOPRIM) 300 MG tablet Take 300 mg by mouth daily as needed (gout flare).    [provider]  amLODipine (NORVASC) 10 MG tablet Take 10 mg by mouth at bedtime.    [provider]  Calcium Polycarbophil (FIBER-CAPS PO) Take 2 capsules by mouth daily.    [provider]  finasteride (PROSCAR) 5 MG tablet Take 5 mg by mouth at bedtime.    [provider]  HYDROcodone-acetaminophen (NORCO/VICODIN) 5-325 MG tablet Take 1 tablet by mouth every 6 (six) hours as needed for up to 3 days for moderate pain. 12/16/20 12/19/20  Billey Co, MD  losartan (COZAAR) 100 MG tablet Take 100 mg by mouth at bedtime. 07/14/20   [provider]  senna-docusate (SENOKOT-S) 8.6-50 MG tablet Take 2 tablets by mouth at bedtime.    [provider]  spironolactone (ALDACTONE) 25 MG tablet Take 25 mg by mouth at bedtime. 09/05/20   [provider]  famotidine (PEPCID) 40 MG tablet Take 40 mg by mouth daily as needed.   01/12/20  [provider]    Allergies Patient has no known allergies.  Family History  Problem Relation Age of Onset   Prostate cancer Neg Hx    Kidney disease Neg Hx     Social History Social History   Tobacco Use   Smoking status: Former    Packs/day: 3.00    Years: 17.00    Pack years: 51.00    Types: Cigarettes    Quit date: 07/31/1992    Years since quitting: 28.4   Smokeless tobacco: Never  Vaping Use   Vaping Use: Never used  Substance Use Topics   Alcohol use: No   Drug use: No    Review of Systems  Review of Systems  Constitutional:  Negative for chills and fever.   HENT:  Negative for sore throat.   Eyes:  Negative for pain.  Respiratory:  Negative for cough and stridor.   Cardiovascular:  Negative for chest pain.  Gastrointestinal:  Negative for vomiting.  Genitourinary:  Positive for dysuria and frequency. Negative for flank pain.  Musculoskeletal:  Negative for myalgias.  Skin:  Negative for rash.  Neurological:  Negative for seizures, loss of consciousness and headaches.  Psychiatric/Behavioral:  Negative for suicidal ideas.   All other systems reviewed and are negative.  ____________________________________________   PHYSICAL EXAM:  VITAL SIGNS: ED Triage Vitals  Enc Vitals Group     BP 12/17/20 0941 130/81     Pulse Rate 12/17/20 0941 72     Resp 12/17/20 0941 18     Temp 12/17/20 0941 98 F (36.7 C)     Temp Source 12/17/20 0941 Oral     SpO2 12/17/20 0941 95 %     Weight 12/17/20 0936 248 lb (112.5 kg)     Height 12/17/20 0936 5\' 9"  (1.753 m)     Head Circumference --      Peak Flow --      Pain Score 12/17/20 0936 3     Pain Loc --      Pain Edu? --      Excl. in [place]? --    Vitals:   12/17/20 0941  BP: 130/81  Pulse: 72  Resp: 18  Temp: 98 F (36.7 C)  SpO2: 95%   Physical Exam Vitals and nursing note reviewed.  Constitutional:      Appearance: He is well-developed.  HENT:     Head: Normocephalic and atraumatic.     Right Ear: External ear normal.     Left Ear: External ear normal.     Nose: Nose normal.  Eyes:     Conjunctiva/sclera: Conjunctivae normal.  Cardiovascular:     Rate and Rhythm: Normal rate and regular rhythm.     Pulses: Normal pulses.     Heart sounds: No murmur heard. Pulmonary:     Effort: Pulmonary effort is normal. No respiratory distress.     Breath sounds: Normal breath sounds.  Abdominal:     Palpations: Abdomen is soft.     Tenderness: There is no abdominal tenderness.  Musculoskeletal:     Cervical back: Neck supple.  Skin:    General: Skin is warm and dry.     Capillary  Refill: Capillary refill takes less than 2 seconds.  Neurological:     Mental Status: He is alert and oriented to person, place, and time.  Psychiatric:        Mood and Affect: Mood normal.    Testicle scrotum and penile shaft are unremarkable.  Scant yellow urine noted at urethral meatus.  There is a very small piece of black string visible protruding from meatus as well. ____________________________________________   LABS (all labs ordered are listed, but only abnormal results are displayed)  Labs Reviewed  URINALYSIS, COMPLETE (UACMP) WITH MICROSCOPIC - Abnormal; Notable for the following components:      Result Value   Color, Urine YELLOW (*)    APPearance HAZY (*)    Hgb urine dipstick LARGE (*)    Protein, ur 100 (*)    Leukocytes,Ua MODERATE (*)    RBC / HPF >50 (*)    All other components within normal limits  BASIC METABOLIC PANEL - Abnormal; Notable for the following components:   Glucose, Bld 124 (*)    All other components within normal limits  URINE CULTURE   ____________________________________________  EKG   ____________________________________________  RADIOLOGY  ED MD interpretation: KUB shows ureteral stent distally dislodged in the urethra.  Bilateral kidney stones.  No other clear acute abnormality noted.  Official radiology report(s): DG Abdomen 1 View  Result Date: 12/17/2020 CLINICAL DATA:  Assess ureteral stent location EXAM: ABDOMEN - 1 VIEW COMPARISON:  None. FINDINGS: Multiple bilateral renal calculi. Ureteral stent is positioned with superior tip over the expected location of the distal  right ureter and the inferior tip not imaged, below the field of view. IMPRESSION: 1. Ureteral stent is positioned with superior tip over the expected location of the distal right ureter and the inferior tip not imaged, below the field of view, presumably within the penile urethra and external to the patient. 2.  Bilateral nephrolithiasis. Electronically Signed    By: Eddie Candle M.D.   On: 12/17/2020 13:06    ____________________________________________   PROCEDURES  Procedure(s) performed (including Critical Care):  .Foreign Body Removal  Date/Time: 12/17/2020 5:05 PM Performed by: Lucrezia Starch, MD Authorized by: Lucrezia Starch, MD  Consent: Verbal consent obtained. Risks and benefits: risks, benefits and alternatives were discussed Consent given by: patient Patient understanding: patient states understanding of the procedure being performed Patient identity confirmed: verbally with patient Time out: Immediately prior to procedure a "time out" was called to verify the correct patient, procedure, equipment, support staff and site/side marked as required.  Sedation: Patient sedated: no  Patient restrained: no Patient cooperative: yes Complexity: simple 1 objects recovered. Objects recovered: ureteral stent Post-procedure assessment: foreign body removed Patient tolerance: patient tolerated the procedure well with no immediate complications    ____________________________________________   INITIAL IMPRESSION / ASSESSMENT AND PLAN / ED COURSE        Patient presents with above to history exam for some persistent dysuria and urine incontinence after recent cystoscopy with stent placement for right-sided kidney stone causing some hydronephrosis.  On arrival he is afebrile and hemodynamic stable.  He denies any back or abdominal pain.  No CVA tenderness on exam.  Differential includes possible incontinence related to stent migration propping open patient's urethral sphincter, overactive bladder and some urethritis and cystitis from recent cystoscopy.  BMP shows normal electrolytes significant for electrode or metabolic derangements.  Kidney function is within normal limits.  KV shows distally displaced stent.  Discussed with on-call urologist Dr. Abner Greenspan who recommended removal is likely causing incontinence or propping up and stent  possibly exacerbated by some bladder spasm.  Recommended starting patient on oxybutynin.  Stent removed without difficulty.  UA with large hemoglobin and moderate protein with moderate leukocyte esterase.  50 RBCs and 21-50 WBCs.  Urine culture sent.  Per recommendation of Dr. Abner Greenspan will cover patient with 3 days of Keflex.  Advised to follow-up with Dr. Jeb Levering.  Discharged stable condition.  Strict return precautions advised and discussed.     ____________________________________________   FINAL CLINICAL IMPRESSION(S) / ED DIAGNOSES  Final diagnoses:  Urinary incontinence, unspecified type  Encounter for removal of ureteral stent  Dysuria    Medications - No data to display   ED Discharge Orders          Ordered    oxybutynin (DITROPAN XL) 10 MG 24 hr tablet  Daily        12/17/20 1300    cephALEXin (KEFLEX) 500 MG capsule  4 times daily        12/17/20 1300             Note:  This document was prepared using Dragon voice recognition software and may include unintentional dictation errors.    Lucrezia Starch, MD 12/17/20 1310    Lucrezia Starch, MD 12/17/20 [phone]

## 2020-12-19 ENCOUNTER — Encounter: Payer: Self-pay | Admitting: Urology

## 2020-12-19 NOTE — Anesthesia Postprocedure Evaluation (Signed)
Anesthesia Post Note  Patient: Thomas Mcdaniel  Procedure(s) Performed: CYSTOSCOPY/URETEROSCOPY/HOLMIUM LASER/STENT PLACEMENT (Right)  Patient location during evaluation: PACU Anesthesia Type: General Level of consciousness: awake and alert and oriented Pain management: pain level controlled Vital Signs Assessment: post-procedure vital signs reviewed and stable Respiratory status: spontaneous breathing, nonlabored ventilation and respiratory function stable Cardiovascular status: blood pressure returned to baseline and stable Postop Assessment: no signs of nausea or vomiting Anesthetic complications: no   No notable events documented.   Last Vitals:  Vitals:   12/16/20 1545 12/16/20 1607  BP: (!) 171/90 (!) 175/90  Pulse: 77 65  Resp: 20 20  Temp: (!) 36.1 C   SpO2: 94% 94%    Last Pain:  Vitals:   12/16/20 1545  TempSrc: Temporal  PainSc: 0-No pain                 Anjani Feuerborn

## 2021-02-01 ENCOUNTER — Ambulatory Visit: Payer: Medicare Other | Admitting: Urology

## 2021-02-02 ENCOUNTER — Encounter: Payer: Self-pay | Admitting: Urology

## 2021-02-22 ENCOUNTER — Encounter: Payer: Self-pay | Admitting: Urology

## 2021-02-22 ENCOUNTER — Ambulatory Visit (INDEPENDENT_AMBULATORY_CARE_PROVIDER_SITE_OTHER): Payer: Medicare Other | Admitting: Urology

## 2021-02-22 ENCOUNTER — Other Ambulatory Visit: Payer: Self-pay

## 2021-02-22 VITALS — BP 123/83 | HR 73 | Ht 69.0 in | Wt 245.0 lb

## 2021-02-22 DIAGNOSIS — N138 Other obstructive and reflux uropathy: Secondary | ICD-10-CM

## 2021-02-22 DIAGNOSIS — N401 Enlarged prostate with lower urinary tract symptoms: Secondary | ICD-10-CM

## 2021-02-22 DIAGNOSIS — N200 Calculus of kidney: Secondary | ICD-10-CM

## 2021-02-22 DIAGNOSIS — K5900 Constipation, unspecified: Secondary | ICD-10-CM | POA: Diagnosis not present

## 2021-02-22 MED ORDER — SENNOSIDES-DOCUSATE SODIUM 8.6-50 MG PO TABS: 1.0000 | ORAL_TABLET | Freq: Two times a day (BID) | ORAL | 11 refills | Status: AC

## 2021-02-22 NOTE — Patient Instructions (Signed)

## 2021-02-22 NOTE — Progress Notes (Signed)
   02/22/2021 9:40 AM   Thomas Mcdaniel 05-Dec-1945 [id number]  Reason for visit: Follow up nephrolithiasis, urinary symptoms  HPI: 75 year old male with 1 year history of urinary urgency, frequency, urge incontinence, and nocturia, recent microscopic hematuria and found to have 6 mm right distal ureteral stone.  He underwent ureteroscopy and stone removal on 12/16/2020 of all right-sided stones.  He had previously been tried on anticholinergics and Myrbetriq for his overactive symptoms, with no improvement.  He had been on Flomax and finasteride long-term.  On cystoscopy, he was found to have a very small and wide open prostate with no bladder abnormalities.  His urinary symptoms have improved since surgery for his kidney stone.  It sounds like there is a strong correlation between his episodes of constipation and his flares of urinary symptoms, and we discussed the close relationship between these 2 systems.  He has not seen gastroenterology.  He is taking laxatives when he gets constipated, and only having a bowel movement every 3 to 4 days.  I recommended trying the senna docusate that had previously been prescribed twice daily, and this can be back down to once a day if loose stools.  Extensive patient information regarding constipation provided today.  Suspect his urinary symptoms are secondary to constipation, his prostate is small and wide open, and bladder is grossly normal.  He has failed overactive bladder medications previously with no improvement.  RTC 6 months PVR, would recommend GI referral if no improvement in the next few weeks with constipation   Billey Co, MD  [facility] [address], [place], [place] [address] [phone]

## 2021-05-19 ENCOUNTER — Ambulatory Visit
Admission: EM | Admit: 2021-05-19 | Discharge: 2021-05-19 | Disposition: A | Discharge status: Home / Self Care | Payer: Medicare Other | Attending: Internal Medicine | Admitting: Internal Medicine

## 2021-05-19 ENCOUNTER — Other Ambulatory Visit: Payer: Self-pay

## 2021-05-19 DIAGNOSIS — M79675 Pain in left toe(s): Secondary | ICD-10-CM | POA: Diagnosis not present

## 2021-05-19 DIAGNOSIS — G8929 Other chronic pain: Secondary | ICD-10-CM

## 2021-05-19 MED ORDER — METHYLPREDNISOLONE SODIUM SUCC 40 MG IJ SOLR
40.0000 mg | Freq: Once | INTRAMUSCULAR | Status: AC
  Administered 2021-05-19: 40 mg via INTRAMUSCULAR

## 2021-05-19 NOTE — ED Triage Notes (Signed)
Patient presents to Urgent Care with complaints of intermittent left toe pain since yesterday evening. Pt describes the pain as shooting pain. Not taking anything for pain. He states he noticed two small spots on his left pinky toe.   Denies injury.

## 2021-05-19 NOTE — Discharge Instructions (Signed)
Follow up with your family Dr if the pain persists

## 2021-05-19 NOTE — ED Provider Notes (Signed)
[facility]    CSN: [id number] Arrival date & time: 05/19/21  1517      History   Chief Complaint Chief Complaint  Patient presents with   Foot Pain    HPI Thomas Mcdaniel is a 75 y.o. male who presents with L small toe pain off and on since yesterday pm. Pain is described as shooting pains. He saw 2 small spots on his L small toe. Has hx of chronic toe pains and states the middle one was amputated for that reason, not due to infection.  Has hx of gout. He states he usually responds to steroid shots into the toe area.    Past Medical History:  Diagnosis Date   Acquired spondylolisthesis    BPH (benign prostatic hyperplasia)    Bradycardia    Burn    Bursitis    right elbow    Cancer ([place])    skin cancer   Complication of anesthesia    difficult to wake up after a lithotripsy   DJD (degenerative joint disease)    Foot drop    bil.   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    Nephrolithiasis    Obesity    Peripheral polyneuropathy    Personal history of PE (pulmonary embolism)    PONV (postoperative nausea and vomiting)    after cataract surgery   Pre-diabetes    Scoliosis    Sleep apnea    pt denies   SOB (shortness of breath)    Spinal stenosis, lumbar region without neurogenic claudication    Urine frequency    UTI (urinary tract infection) during pregnancy, unspecified trimester     Patient Active Problem List   Diagnosis Date Noted   Altered mental status 12/14/2015   Altered mental state 12/14/2015   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    Past Surgical History:  Procedure  Laterality Date   CATARACT EXTRACTION     CATARACT EXTRACTION W/PHACO Right 08/25/2019   Procedure: CATARACT EXTRACTION PHACO AND INTRAOCULAR LENS PLACEMENT ([place]) MALYUGIN RIGHT 7.94  00:48.5;  Surgeon: Birder Robson, MD;  Location: [place];  Service: Ophthalmology;  Laterality: Right;   COLONOSCOPY WITH PROPOFOL N/A 08/01/2015   Procedure: COLONOSCOPY WITH PROPOFOL;  Surgeon: Josefine Class, MD;  Location: [facility] ENDOSCOPY;  Service: Endoscopy;  Laterality: N/A;   COLONOSCOPY WITH PROPOFOL N/A 09/25/2019   Procedure: COLONOSCOPY WITH PROPOFOL;  Surgeon: Robert Bellow, MD;  Location: [facility] ENDOSCOPY;  Service: Endoscopy;  Laterality: N/A;   CYSTOSCOPY/URETEROSCOPY/HOLMIUM LASER/STENT PLACEMENT Right 12/16/2020   Procedure: CYSTOSCOPY/URETEROSCOPY/HOLMIUM LASER/STENT PLACEMENT;  Surgeon: Billey Co, MD;  Location: [facility] ORS;  Service: Urology;  Laterality: Right;   DIAGNOSTIC LAPAROSCOPY     FEMORAL ARTERY REPAIR     HERNIA REPAIR     JOINT REPLACEMENT Right    knee   KNEE ARTHROPLASTY Right 12/05/2015   Procedure: COMPUTER ASSISTED TOTAL KNEE ARTHROPLASTY;  Surgeon: Dereck Leep, MD;  Location: [facility] ORS;  Service: Orthopedics;  Laterality: Right;   knee arthroscopy  LAMINECTOMY     LITHOTRIPSY     LITHOTRIPSY     SPINAL FUSION     TOE AMPUTATION     TONSILLECTOMY         Home Medications    Prior to Admission medications   Medication Sig Start Date End Date Taking? Authorizing Provider  acetaminophen (TYLENOL) 650 MG CR tablet Take 1,300 mg by mouth every 8 (eight) hours as needed for pain.    [provider]  allopurinol (ZYLOPRIM) 300 MG tablet Take 300 mg by mouth daily as needed (gout flare).    [provider]  amLODipine (NORVASC) 10 MG tablet Take 10 mg by mouth at bedtime.    [provider]  Calcium Polycarbophil (FIBER-CAPS PO) Take 2 capsules by mouth daily.    [provider]  finasteride (PROSCAR) 5  MG tablet Take 5 mg by mouth at bedtime.    [provider]  losartan (COZAAR) 100 MG tablet Take 100 mg by mouth at bedtime. 07/14/20   [provider]  senna-docusate (SENOKOT-S) 8.6-50 MG tablet Take 1 tablet by mouth 2 (two) times daily. Decrease to 1x daily or hold for diarrhea/loose stools 02/22/21   Billey Co, MD  spironolactone (ALDACTONE) 25 MG tablet Take 25 mg by mouth at bedtime. 09/05/20   [provider]  famotidine (PEPCID) 40 MG tablet Take 40 mg by mouth daily as needed.   01/12/20  [provider]    Family History Family History  Problem Relation Age of Onset   Prostate cancer Neg Hx    Kidney disease Neg Hx     Social History Social History   Tobacco Use   Smoking status: Former    Packs/day: 3.00    Years: 17.00    Pack years: 51.00    Types: Cigarettes    Quit date: 07/31/1992    Years since quitting: 28.8   Smokeless tobacco: Never  Vaping Use   Vaping Use: Never used  Substance Use Topics   Alcohol use: No   Drug use: No     Allergies   Patient has no known allergies.   Review of Systems Review of Systems  Constitutional:  Negative for chills, diaphoresis and fever.  Musculoskeletal:  Positive for arthralgias.  Skin:  Negative for color change, pallor, rash and wound.    Physical Exam Triage Vital Signs ED Triage Vitals  Enc Vitals Group     BP 05/19/21 0841 (!) 157/105     Pulse Rate 05/19/21 0841 75     Resp 05/19/21 0841 16     Temp 05/19/21 0841 97.7 F (36.5 C)     Temp Source 05/19/21 0841 Oral     SpO2 05/19/21 0841 97 %     Weight --      Height --      Head Circumference --      Peak Flow --      Pain Score 05/19/21 0839 9     Pain Loc --      Pain Edu? --      Excl. in [place]? --    No data found.  Updated Vital Signs BP (!) 157/105 (BP Location: Left Arm)   Pulse 75   Temp 97.7 F (36.5 C) (Oral)   Resp 16   SpO2 97%   Visual Acuity Right Eye Distance:   Left Eye Distance:    Bilateral Distance:    Right Eye Near:   Left Eye Near:  Bilateral Near:     Physical Exam Constitutional:      General: He is not in acute distress.    Appearance: He is not toxic-appearing.     Comments: Uses a cane When I walked in the room, pt had urinated on the floor saying the reason was because our bathroom was out of order. I explained this was not true  HENT:     Right Ear: External ear normal.     Left Ear: External ear normal.  Eyes:     General: No scleral icterus.    Conjunctiva/sclera: Conjunctivae normal.  Pulmonary:     Effort: Pulmonary effort is normal.  Musculoskeletal:     Cervical back: Neck supple.  Skin:    General: Skin is warm and dry.     Comments: L SMALL TOE- with mild swelling and erythema and no wounds. Is mildly tender.   Neurological:     Mental Status: He is alert and oriented to person, place, and time.     Gait: Gait normal.  Psychiatric:        Mood and Affect: Mood normal.        Behavior: Behavior normal.        Thought Content: Thought content normal.        Judgment: Judgment normal.     UC Treatments / Results  Labs (all labs ordered are listed, but only abnormal results are displayed) Labs Reviewed - No data to display  EKG   Radiology No results found.  Procedures Procedures (including critical care time)  Medications Ordered in UC Medications - No data to display  Initial Impression / Assessment and Plan / UC Course  I have reviewed the triage vital signs and the nursing notes. L small chronic pain, recurrent. I explained to him we don't do local toe steroid shots, but was wiling to take it via  IM.  He was given solumedrol IM as noted.  Final Clinical Impressions(s) / UC Diagnoses   Final diagnoses:  None   Discharge Instructions   None    ED Prescriptions   None    PDMP not reviewed this encounter.   Shelby Mattocks, PA-C 05/19/21 0915

## 2021-05-24 ENCOUNTER — Ambulatory Visit: Payer: Medicare Other | Admitting: Urology

## 2021-05-26 ENCOUNTER — Encounter: Payer: Self-pay | Admitting: Urology

## 2021-06-15 ENCOUNTER — Encounter: Payer: Self-pay | Admitting: Urology

## 2021-06-15 ENCOUNTER — Ambulatory Visit (INDEPENDENT_AMBULATORY_CARE_PROVIDER_SITE_OTHER): Payer: Medicare Other | Admitting: Urology

## 2021-06-15 ENCOUNTER — Other Ambulatory Visit: Payer: Self-pay

## 2021-06-15 VITALS — BP 164/93 | HR 68 | Ht 69.0 in | Wt 248.0 lb

## 2021-06-15 DIAGNOSIS — N401 Enlarged prostate with lower urinary tract symptoms: Secondary | ICD-10-CM | POA: Diagnosis not present

## 2021-06-15 DIAGNOSIS — R351 Nocturia: Secondary | ICD-10-CM

## 2021-06-15 DIAGNOSIS — N138 Other obstructive and reflux uropathy: Secondary | ICD-10-CM

## 2021-06-15 NOTE — Patient Instructions (Signed)
It is normal for patients to get up at least 1-2 times per night as they get into their 60s and 70s.  Cut back on fluid intake 3 to 4 hours before bedtime, and urinate right before going to bed.  Your constipation likely worsens your urinary symptoms, and continue to use the MiraLAX as needed for constipation

## 2021-06-15 NOTE — Progress Notes (Signed)
° °  06/15/2021 1:44 PM   Lurlean Horns October 03, 1945 [id number]  Reason for visit: Follow up BPH, nephrolithiasis, nocturia  HPI: 75 year old male with long history of urinary symptoms including frequency, urgency, urge incontinence, and nocturia.  He had been on Flomax and finasteride long-term, currently just on finasteride.  He was previously trialed on multiple OAB medications including anticholinergics and Myrbetriq with no improvement in his urgency/frequency.  He was found to have microscopic hematuria in June 2022, and CT showed a distal ureteral stone.  He underwent ureteroscopy on the right side, and after this had resolution of his urgency/frequency.  Suspect his overactive symptoms were secondary to the distal ureteral stone.  At the time of surgery he had a small wide open appearing prostate and normal-appearing bladder.  From a urinary perspective he has been doing very well over the last few weeks.  Really his only complaint is nocturia 2-4 times at night.  He does snore, and I do not see that he has been evaluated for sleep apnea previously.  He denies significant urinary symptoms during the day, specifically no urgency, frequency, or incontinence.  He is urinating with a good stream.  IPSS score today is 13 with quality of life delighted and PVR is normal at 0 mL.  We have previously discussed at length that his constipation likely plays a large role in his urinary symptoms, and he is able to correlate worsening urinary symptoms on the days that he is significantly constipated.  He has started taking MiraLAX as needed which he feels helps significantly.  -Okay to continue finasteride -Reassurance provided behavioral strategies discussed.  Could consider a sleep apnea study in the future as possible etiology for his nocturia. -RTC 1 year KUB PVR    Billey Co, MD  [facility] [address] [place], [place] [address] [phone]

## 2021-08-23 ENCOUNTER — Ambulatory Visit: Payer: Medicare Other | Admitting: Urology

## 2021-08-30 ENCOUNTER — Ambulatory Visit: Payer: Medicare Other | Admitting: Family Medicine

## 2021-09-19 DIAGNOSIS — R296 Repeated falls: Secondary | ICD-10-CM

## 2021-11-02 ENCOUNTER — Other Ambulatory Visit (HOSPITAL_COMMUNITY): Payer: Self-pay | Admitting: Emergency Medicine

## 2021-11-02 ENCOUNTER — Other Ambulatory Visit: Payer: Self-pay | Admitting: Physician Assistant

## 2021-11-02 DIAGNOSIS — E782 Mixed hyperlipidemia: Secondary | ICD-10-CM

## 2021-11-02 DIAGNOSIS — I10 Essential (primary) hypertension: Secondary | ICD-10-CM

## 2021-11-02 DIAGNOSIS — R079 Chest pain, unspecified: Secondary | ICD-10-CM

## 2021-11-02 DIAGNOSIS — R0789 Other chest pain: Secondary | ICD-10-CM

## 2021-11-02 DIAGNOSIS — I2510 Atherosclerotic heart disease of native coronary artery without angina pectoris: Secondary | ICD-10-CM

## 2021-11-02 DIAGNOSIS — R0602 Shortness of breath: Secondary | ICD-10-CM

## 2021-11-02 MED ORDER — IVABRADINE HCL 5 MG PO TABS: 15.0000 mg | ORAL_TABLET | Freq: Once | ORAL | 0 refills | Status: AC

## 2021-11-02 MED ORDER — METOPROLOL TARTRATE 100 MG PO TABS: 100.0000 mg | ORAL_TABLET | Freq: Once | ORAL | 0 refills | Status: DC

## 2021-11-03 ENCOUNTER — Telehealth (HOSPITAL_COMMUNITY): Payer: Self-pay | Admitting: Emergency Medicine

## 2021-11-03 NOTE — Telephone Encounter (Signed)
Attempted to call patient regarding upcoming cardiac CT appointment. °Left message on voicemail with name and callback number °Claribel Sachs RN Navigator Cardiac Imaging °[facility] °[phone] Office °[phone] Cell ° °

## 2021-11-03 NOTE — Telephone Encounter (Signed)
Attempted to call patient regarding upcoming cardiac CT appointment. °Left message on voicemail with name and callback number °Radiance Deady RN Navigator Cardiac Imaging °[facility] °[phone] Office °[phone] Cell ° °

## 2021-11-06 ENCOUNTER — Ambulatory Visit
Admission: RE | Admit: 2021-11-06 | Discharge: 2021-11-06 | Disposition: A | Discharge status: Home / Self Care | Payer: Medicare Other | Source: Ambulatory Visit | Attending: Physician Assistant | Admitting: Physician Assistant

## 2021-11-06 DIAGNOSIS — E782 Mixed hyperlipidemia: Secondary | ICD-10-CM | POA: Diagnosis present

## 2021-11-06 DIAGNOSIS — I2510 Atherosclerotic heart disease of native coronary artery without angina pectoris: Secondary | ICD-10-CM | POA: Diagnosis present

## 2021-11-06 DIAGNOSIS — I10 Essential (primary) hypertension: Secondary | ICD-10-CM | POA: Insufficient documentation

## 2021-11-06 DIAGNOSIS — R0602 Shortness of breath: Secondary | ICD-10-CM | POA: Insufficient documentation

## 2021-11-06 MED ORDER — NITROGLYCERIN 0.4 MG SL SUBL
0.8000 mg | SUBLINGUAL_TABLET | Freq: Once | SUBLINGUAL | Status: AC
  Administered 2021-11-06: 0.8 mg via SUBLINGUAL

## 2021-11-06 MED ORDER — IOHEXOL 350 MG/ML SOLN
100.0000 mL | Freq: Once | INTRAVENOUS | Status: AC | PRN
  Administered 2021-11-06: 100 mL via INTRAVENOUS

## 2021-11-06 NOTE — Progress Notes (Signed)
Patient tolerated procedure well. Ambulate w/o difficulty. Denies any lightheadedness or being dizzy. Pt denies any pain at this time. Sitting in chair, drinking water provided. P is encouraged to drink additional water throughout the day and reason explained to patient. Patient verbalized understanding and all questions answered. ABC intact. No further needs at this time. Discharge from procedure area w/o issues.  °

## 2021-12-07 ENCOUNTER — Ambulatory Visit
Admission: RE | Admit: 2021-12-07 | Discharge: 2021-12-07 | Disposition: A | Discharge status: Home / Self Care | Payer: Medicare Other | Source: Ambulatory Visit | Attending: Cardiology | Admitting: Cardiology

## 2021-12-07 ENCOUNTER — Encounter: Payer: Self-pay | Admitting: Cardiology

## 2021-12-07 ENCOUNTER — Encounter
Admission: RE | Disposition: A | Discharge status: Home / Self Care | Payer: Self-pay | Source: Ambulatory Visit | Attending: Cardiology

## 2021-12-07 ENCOUNTER — Other Ambulatory Visit: Payer: Self-pay

## 2021-12-07 DIAGNOSIS — R079 Chest pain, unspecified: Secondary | ICD-10-CM | POA: Diagnosis not present

## 2021-12-07 DIAGNOSIS — I2510 Atherosclerotic heart disease of native coronary artery without angina pectoris: Secondary | ICD-10-CM | POA: Insufficient documentation

## 2021-12-07 DIAGNOSIS — I2584 Coronary atherosclerosis due to calcified coronary lesion: Secondary | ICD-10-CM | POA: Diagnosis not present

## 2021-12-07 DIAGNOSIS — R931 Abnormal findings on diagnostic imaging of heart and coronary circulation: Secondary | ICD-10-CM

## 2021-12-07 DIAGNOSIS — Z01818 Encounter for other preprocedural examination: Secondary | ICD-10-CM

## 2021-12-07 HISTORY — PX: CORONARY PRESSURE WIRE/FFR WITH 3D MAPPING: CATH118309

## 2021-12-07 HISTORY — PX: LEFT HEART CATH AND CORONARY ANGIOGRAPHY: CATH118249

## 2021-12-07 LAB — BASIC METABOLIC PANEL
Anion gap: 7 (ref 5–15)
BUN: 20 mg/dL (ref 8–23)
CO2: 22 mmol/L (ref 22–32)
Calcium: 9.7 mg/dL (ref 8.9–10.3)
Chloride: 111 mmol/L (ref 98–111)
Creatinine, Ser: 0.79 mg/dL (ref 0.61–1.24)
GFR, Estimated: >60 mL/min (ref >60)
Glucose, Bld: 124 mg/dL — ABNORMAL HIGH (ref 70–99)
Potassium: 4 mmol/L (ref 3.5–5.1)
Sodium: 140 mmol/L (ref 135–145)

## 2021-12-07 LAB — CBC
HCT: 43.7 % (ref 39.0–52.0)
Hemoglobin: 14.8 g/dL (ref 13.0–17.0)
MCH: 31.5 pg (ref 26.0–34.0)
MCHC: 33.9 g/dL (ref 30.0–36.0)
MCV: 93 fL (ref 80.0–100.0)
Platelets: 143 10*3/uL — ABNORMAL LOW (ref 150–400)
RBC: 4.7 MIL/uL (ref 4.22–5.81)
RDW: 13.4 % (ref 11.5–15.5)
WBC: 6.3 10*3/uL (ref 4.0–10.5)
nRBC: 0 % (ref 0.0–0.2)

## 2021-12-07 SURGERY — LEFT HEART CATH AND CORONARY ANGIOGRAPHY
Anesthesia: Moderate Sedation

## 2021-12-07 MED ORDER — SODIUM CHLORIDE 0.9% FLUSH: 3.0000 mL | Freq: Two times a day (BID) | INTRAVENOUS | Status: DC

## 2021-12-07 MED ORDER — VERAPAMIL HCL 2.5 MG/ML IV SOLN
INTRAVENOUS | Status: AC
  Filled 2021-12-07: qty 2

## 2021-12-07 MED ORDER — MIDAZOLAM HCL 2 MG/2ML IJ SOLN
INTRAMUSCULAR | Status: AC
  Filled 2021-12-07: qty 2

## 2021-12-07 MED ORDER — FENTANYL CITRATE (PF) 100 MCG/2ML IJ SOLN
INTRAMUSCULAR | Status: DC | PRN
  Administered 2021-12-07: 50 ug via INTRAVENOUS

## 2021-12-07 MED ORDER — HEPARIN SODIUM (PORCINE) 1000 UNIT/ML IJ SOLN
INTRAMUSCULAR | Status: AC
  Filled 2021-12-07: qty 10

## 2021-12-07 MED ORDER — ASPIRIN 81 MG PO CHEW: 81.0000 mg | CHEWABLE_TABLET | ORAL | Status: DC

## 2021-12-07 MED ORDER — SODIUM CHLORIDE 0.9 % IV SOLN: 250.0000 mL | INTRAVENOUS | Status: DC | PRN

## 2021-12-07 MED ORDER — ACETAMINOPHEN 325 MG PO TABS: 650.0000 mg | ORAL_TABLET | ORAL | Status: DC | PRN

## 2021-12-07 MED ORDER — LIDOCAINE HCL 1 % IJ SOLN
INTRAMUSCULAR | Status: AC
  Filled 2021-12-07: qty 20

## 2021-12-07 MED ORDER — SODIUM CHLORIDE 0.9% FLUSH: 3.0000 mL | INTRAVENOUS | Status: DC | PRN

## 2021-12-07 MED ORDER — SODIUM CHLORIDE 0.9 % WEIGHT BASED INFUSION: 1.0000 mL/kg/h | INTRAVENOUS | Status: DC

## 2021-12-07 MED ORDER — HEPARIN (PORCINE) IN NACL 1000-0.9 UT/500ML-% IV SOLN
INTRAVENOUS | Status: AC
  Filled 2021-12-07: qty 1000

## 2021-12-07 MED ORDER — FENTANYL CITRATE (PF) 100 MCG/2ML IJ SOLN
INTRAMUSCULAR | Status: AC
  Filled 2021-12-07: qty 2

## 2021-12-07 MED ORDER — ASPIRIN 81 MG PO CHEW
CHEWABLE_TABLET | ORAL | Status: AC
  Filled 2021-12-07: qty 1

## 2021-12-07 MED ORDER — LABETALOL HCL 5 MG/ML IV SOLN: 10.0000 mg | INTRAVENOUS | Status: DC | PRN

## 2021-12-07 MED ORDER — HEPARIN SODIUM (PORCINE) 1000 UNIT/ML IJ SOLN
INTRAMUSCULAR | Status: DC | PRN
  Administered 2021-12-07: 5000 [IU] via INTRAVENOUS

## 2021-12-07 MED ORDER — ONDANSETRON HCL 4 MG/2ML IJ SOLN: 4.0000 mg | Freq: Four times a day (QID) | INTRAMUSCULAR | Status: DC | PRN

## 2021-12-07 MED ORDER — MIDAZOLAM HCL 2 MG/2ML IJ SOLN
INTRAMUSCULAR | Status: DC | PRN
  Administered 2021-12-07: 1 mg via INTRAVENOUS

## 2021-12-07 MED ORDER — SODIUM CHLORIDE 0.9 % WEIGHT BASED INFUSION
3.0000 mL/kg/h | INTRAVENOUS | Status: AC
  Administered 2021-12-07: 3 mL/kg/h via INTRAVENOUS

## 2021-12-07 MED ORDER — HEPARIN (PORCINE) IN NACL 1000-0.9 UT/500ML-% IV SOLN
INTRAVENOUS | Status: DC | PRN
  Administered 2021-12-07 (×2): 500 mL

## 2021-12-07 MED ORDER — ASPIRIN 81 MG PO CHEW
81.0000 mg | CHEWABLE_TABLET | ORAL | Status: AC
  Administered 2021-12-07: 81 mg via ORAL

## 2021-12-07 MED ORDER — SODIUM CHLORIDE 0.9 % IV SOLN: INTRAVENOUS | Status: DC

## 2021-12-07 MED ORDER — HYDRALAZINE HCL 20 MG/ML IJ SOLN: 10.0000 mg | INTRAMUSCULAR | Status: DC | PRN

## 2021-12-07 MED ORDER — LIDOCAINE HCL (PF) 1 % IJ SOLN
INTRAMUSCULAR | Status: DC | PRN
  Administered 2021-12-07: 2 mL
  Administered 2021-12-07: 16 mL

## 2021-12-07 MED ORDER — VERAPAMIL HCL 2.5 MG/ML IV SOLN
INTRAVENOUS | Status: DC | PRN
  Administered 2021-12-07: 2.5 mg via INTRA_ARTERIAL

## 2021-12-07 MED ORDER — IOHEXOL 300 MG/ML  SOLN
INTRAMUSCULAR | Status: DC | PRN
  Administered 2021-12-07: 150 mL

## 2021-12-07 SURGICAL SUPPLY — 23 items
BAND ZEPHYR COMPRESS 30 LONG (HEMOSTASIS) ×1 IMPLANT
CATH 5FR JL3.5 JR4 ANG PIG MP (CATHETERS) ×1 IMPLANT
CATH INFINITI 5FR JL4 (CATHETERS) ×1 IMPLANT
CATH INFINITI 5FR JL5 (CATHETERS) ×1 IMPLANT
CATH VISTA GUIDE 6FR XB4 (CATHETERS) ×1 IMPLANT
DEVICE CLOSURE MYNXGRIP 6/7F (Vascular Products) ×1 IMPLANT
DRAPE BRACHIAL (DRAPES) ×1 IMPLANT
GLIDESHEATH SLEND SS 6F .021 (SHEATH) ×1 IMPLANT
GUIDEWIRE INQWIRE 1.5J.035X260 (WIRE) IMPLANT
GUIDEWIRE PRESS OMNI 185 ST (WIRE) ×1 IMPLANT
INQWIRE 1.5J .035X260CM (WIRE) ×2
KIT ENCORE 26 ADVANTAGE (KITS) ×1 IMPLANT
KIT SYRINGE INJ CVI SPIKEX1 (MISCELLANEOUS) ×1 IMPLANT
NDL PERC 18GX7CM (NEEDLE) IMPLANT
NEEDLE PERC 18GX7CM (NEEDLE) ×2 IMPLANT
PACK CARDIAC CATH (CUSTOM PROCEDURE TRAY) ×2 IMPLANT
PROTECTION STATION PRESSURIZED (MISCELLANEOUS) ×2
SET ATX SIMPLICITY (MISCELLANEOUS) ×1 IMPLANT
SHEATH AVANTI 5FR X 11CM (SHEATH) ×1 IMPLANT
SHEATH AVANTI 6FR X 11CM (SHEATH) ×1 IMPLANT
STATION PROTECTION PRESSURIZED (MISCELLANEOUS) IMPLANT
WIRE GUIDERIGHT .035X150 (WIRE) ×1 IMPLANT
WIRE HITORQ VERSACORE ST 145CM (WIRE) ×1 IMPLANT

## 2021-12-08 ENCOUNTER — Encounter: Payer: Self-pay | Admitting: Cardiology

## 2022-01-16 ENCOUNTER — Ambulatory Visit: Payer: Medicare Other | Admitting: Family Medicine

## 2022-04-16 ENCOUNTER — Other Ambulatory Visit (HOSPITAL_COMMUNITY): Payer: Self-pay | Admitting: Orthopedic Surgery

## 2022-04-16 ENCOUNTER — Other Ambulatory Visit: Payer: Self-pay | Admitting: Orthopedic Surgery

## 2022-04-16 DIAGNOSIS — G8929 Other chronic pain: Secondary | ICD-10-CM

## 2022-04-16 DIAGNOSIS — M5116 Intervertebral disc disorders with radiculopathy, lumbar region: Secondary | ICD-10-CM

## 2022-04-17 ENCOUNTER — Ambulatory Visit: Payer: Medicare Other

## 2022-04-17 ENCOUNTER — Ambulatory Visit
Admission: RE | Admit: 2022-04-17 | Discharge: 2022-04-17 | Disposition: A | Discharge status: Home / Self Care | Payer: Medicare Other | Source: Ambulatory Visit | Attending: Orthopedic Surgery | Admitting: Orthopedic Surgery

## 2022-04-17 DIAGNOSIS — M5116 Intervertebral disc disorders with radiculopathy, lumbar region: Secondary | ICD-10-CM | POA: Diagnosis present

## 2022-04-17 DIAGNOSIS — M5442 Lumbago with sciatica, left side: Secondary | ICD-10-CM | POA: Insufficient documentation

## 2022-04-17 DIAGNOSIS — M5441 Lumbago with sciatica, right side: Secondary | ICD-10-CM | POA: Insufficient documentation

## 2022-04-17 DIAGNOSIS — G8929 Other chronic pain: Secondary | ICD-10-CM | POA: Insufficient documentation

## 2022-05-02 ENCOUNTER — Ambulatory Visit: Payer: Medicare Other | Admitting: Family Medicine

## 2022-05-04 ENCOUNTER — Other Ambulatory Visit: Payer: Self-pay

## 2022-05-04 ENCOUNTER — Ambulatory Visit
Admission: RE | Admit: 2022-05-04 | Discharge: 2022-05-04 | Disposition: A | Discharge status: Home / Self Care | Payer: Self-pay | Source: Ambulatory Visit | Attending: Neurosurgery | Admitting: Neurosurgery

## 2022-05-04 DIAGNOSIS — Z049 Encounter for examination and observation for unspecified reason: Secondary | ICD-10-CM

## 2022-05-07 NOTE — Progress Notes (Unsigned)
Referring Physician:  Valera Castle, [address] [place],  [place] [address]  Primary Physician:  Valera Castle, MD  History of Present Illness: 05/07/2022 [name] is here today with a chief complaint of back pain that radiates into the bilateral legs and stops at the knees. Also complains of weakness in the legs.   He has had problems in his right leg with a foot drop of several years duration.  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  He has pain around this that is his most pressing issue.  He also has some moderate back pain when he stands and walks.  He has pain with excessive bending, prolonged standing and sitting and walking.  He walks with a cane.  He has been prescribed an AFO but does not use it.  His wife reports that he was somewhat better with physical therapy but was not compliant with home exercises.    Conservative measures:  Physical therapy: has participated in at [facility] from 12/20/21 to 01/17/22 but wasn't discharged Multimodal medical therapy including regular antiinflammatories: ibuprofen, lyrica Injections: has not received any epidural steroid injections  Past Surgery:  Lumbar Surgery in 2012 by Dr. Velna Ochs  Lumbar Fusion in 2014 by Dr. Alcus Dad has no symptoms of cervical myelopathy.  The symptoms are causing a significant impact on the patient's life.   Review of Systems:  A 10 point review of systems is negative, except for the pertinent positives and negatives detailed in the HPI.  Past Medical History: Past Medical History:  Diagnosis Date   Acquired spondylolisthesis    BPH (benign prostatic hyperplasia)    Bradycardia    Burn    Bursitis    right elbow    Cancer ([place])    skin cancer   Complication of anesthesia    difficult to wake up after a lithotripsy   DJD (degenerative joint disease)    Foot drop    bil.   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    Nephrolithiasis    Obesity    Peripheral polyneuropathy    Personal history of PE (pulmonary embolism)    PONV (postoperative nausea and vomiting)    after cataract surgery   Pre-diabetes    Scoliosis    Sleep apnea    pt denies   SOB (shortness of breath)    Spinal stenosis, lumbar region without neurogenic claudication    Urine frequency    UTI (urinary tract infection) during pregnancy, unspecified trimester     Past Surgical History: Past Surgical History:  Procedure Laterality Date   CATARACT EXTRACTION     CATARACT EXTRACTION W/PHACO Right 08/25/2019   Procedure: CATARACT EXTRACTION PHACO AND INTRAOCULAR LENS PLACEMENT ([place]) MALYUGIN RIGHT 7.94  00:48.5;  Surgeon: Birder Robson, MD;  Location: [facility];  Service: Ophthalmology;  Laterality: Right;   COLONOSCOPY WITH PROPOFOL N/A 08/01/2015   Procedure: COLONOSCOPY WITH PROPOFOL;  Surgeon: Josefine Class, MD;  Location: [facility];  Service: Endoscopy;  Laterality: N/A;   COLONOSCOPY WITH PROPOFOL N/A 09/25/2019   Procedure: COLONOSCOPY WITH PROPOFOL;  Surgeon: Robert Bellow, MD;  Location: [facility];  Service: Endoscopy;  Laterality: N/A;   CORONARY PRESSURE WIRE/FFR WITH 3D MAPPING N/A 12/07/2021   Procedure: Coronary Pressure Wire/FFR w/3D Mapping;  Surgeon: Isaias Cowman, MD;  Location: [facility];  Service: Cardiovascular;  Laterality: N/A;   CYSTOSCOPY/URETEROSCOPY/HOLMIUM LASER/STENT PLACEMENT Right 12/16/2020   Procedure: CYSTOSCOPY/URETEROSCOPY/HOLMIUM LASER/STENT PLACEMENT;  Surgeon: Billey Co,  MD;  Location: [facility];  Service: Urology;  Laterality: Right;   DIAGNOSTIC LAPAROSCOPY     FEMORAL ARTERY REPAIR     HERNIA REPAIR     JOINT REPLACEMENT Right    knee   KNEE ARTHROPLASTY Right 12/05/2015   Procedure: COMPUTER ASSISTED TOTAL KNEE ARTHROPLASTY;  Surgeon: Dereck Leep, MD;  Location: [facility];  Service: Orthopedics;  Laterality: Right;   knee arthroscopy     LAMINECTOMY     LEFT HEART CATH AND CORONARY ANGIOGRAPHY N/A 12/07/2021   Procedure: LEFT HEART CATH AND CORONARY ANGIOGRAPHY;  Surgeon: Isaias Cowman, MD;  Location: [facility];  Service: Cardiovascular;  Laterality: N/A;   LITHOTRIPSY     LITHOTRIPSY     SPINAL FUSION     TOE AMPUTATION     TONSILLECTOMY      Allergies: Allergies as of 05/08/2022   (No Known Allergies)    Medications: No outpatient medications have been marked as taking for the 05/08/22 encounter (Appointment) with Meade Maw, MD.    Social History: Social History   Tobacco Use   Smoking status: Former    Packs/day: 3.00    Years: 17.00    Total pack years: 51.00    Types: Cigarettes    Quit date: 07/31/1992    Years since quitting: 29.7   Smokeless tobacco: Never  Vaping Use   Vaping Use: Never used  Substance Use Topics   Alcohol use: No   Drug use: No    Family Medical History: Family History  Problem Relation Age of Onset   Prostate cancer Neg Hx    Kidney disease Neg Hx     Physical Examination: There were no vitals filed for this visit.  General: Patient is well developed, well nourished, calm, collected, and in no apparent distress. Attention to examination is appropriate.  Neck:   Supple.  Full range of motion.  Respiratory: Patient is breathing without any difficulty.   NEUROLOGICAL:     Awake, alert, oriented to person, place, and time.  Speech is clear and fluent. Fund of knowledge is appropriate.   Cranial Nerves: Pupils equal round and reactive to light.  Facial tone is  symmetric.  Facial sensation is symmetric. Shoulder shrug is symmetric. Tongue protrusion is midline.  There is no pronator drift.  ROM of spine: full.    Strength: Side Biceps Triceps Deltoid Interossei Grip Wrist Ext. Wrist Flex.  R '5 5 5 5 5 5 5  '$ L '5 5 5 5 5 5 5   '$ Side Iliopsoas Quads Hamstring PF DF EHL  R '5 5 5 5 3 '$ 4-  L '5 5 5 5 5 5   '$ Reflexes are 1+ and symmetric at the biceps, triceps, brachioradialis, patella and achilles.   Hoffman's is absent.   Bilateral upper and lower extremity sensation is intact to light touch.    No evidence of dysmetria noted.  Gait is abnormal due to foot drop.  Medical Decision Making  Imaging: MRI L spine 04/17/2022 IMPRESSION: 1. L2-L3 mild spinal canal stenosis and mild bilateral neural foraminal narrowing. Narrowing of the lateral recesses at this level could affect the descending L3 nerve roots. 2. L5-S1 mild right neural foraminal narrowing. 3. Narrowing of the right lateral recess at T12-L1 and narrowing of the lateral recesses at L1-L2 could affect the descending right L1 and bilateral L2 nerve roots, respectively.     Electronically Signed   By: Merilyn Baba M.D.   On: 04/17/2022 20:31  I have personally reviewed the images and agree with the above interpretation.  Assessment and Plan: Mr. Blyden is a pleasant 76 y.o. male with right-sided foot drop.  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  He does not have any high-grade stenosis in his lumbar spine that could explain his symptoms.  At this point, I think his options are to do nothing, pursue physical therapy, consider consultation for injections, or consider a spinal cord stimulator.  I would not recommend spinal surgery.  He has elected for consultation for injections.  I will see him back on an as-needed basis.   I spent a total of 30 minutes in face-to-face and non-face-to-face activities related to this patient's care  today.  Thank you for involving me in the care of this patient.      Yaw Escoto K. Izora Ribas MD, [facility]

## 2022-05-08 ENCOUNTER — Ambulatory Visit (INDEPENDENT_AMBULATORY_CARE_PROVIDER_SITE_OTHER): Payer: Medicare Other | Admitting: Neurosurgery

## 2022-05-08 ENCOUNTER — Encounter: Payer: Self-pay | Admitting: Neurosurgery

## 2022-05-08 VITALS — BP 140/84 | Ht 69.0 in | Wt 238.4 lb

## 2022-05-08 DIAGNOSIS — G8929 Other chronic pain: Secondary | ICD-10-CM

## 2022-05-08 DIAGNOSIS — M5136 Other intervertebral disc degeneration, lumbar region: Secondary | ICD-10-CM

## 2022-05-08 DIAGNOSIS — M21371 Foot drop, right foot: Secondary | ICD-10-CM

## 2022-05-08 DIAGNOSIS — M4316 Spondylolisthesis, lumbar region: Secondary | ICD-10-CM

## 2022-05-08 DIAGNOSIS — M5441 Lumbago with sciatica, right side: Secondary | ICD-10-CM

## 2022-06-07 ENCOUNTER — Encounter: Payer: Self-pay | Admitting: Urology

## 2022-06-07 ENCOUNTER — Ambulatory Visit (INDEPENDENT_AMBULATORY_CARE_PROVIDER_SITE_OTHER): Payer: Medicare Other | Admitting: Urology

## 2022-06-07 ENCOUNTER — Ambulatory Visit
Admission: RE | Admit: 2022-06-07 | Discharge: 2022-06-07 | Disposition: A | Discharge status: Home / Self Care | Payer: Medicare Other | Attending: Urology | Admitting: Urology

## 2022-06-07 ENCOUNTER — Ambulatory Visit
Admission: RE | Admit: 2022-06-07 | Discharge: 2022-06-07 | Disposition: A | Discharge status: Home / Self Care | Payer: Medicare Other | Source: Ambulatory Visit | Attending: Urology | Admitting: Urology

## 2022-06-07 VITALS — BP 129/77 | HR 73 | Ht 69.0 in | Wt 240.0 lb

## 2022-06-07 DIAGNOSIS — N138 Other obstructive and reflux uropathy: Secondary | ICD-10-CM | POA: Diagnosis present

## 2022-06-07 DIAGNOSIS — N401 Enlarged prostate with lower urinary tract symptoms: Secondary | ICD-10-CM | POA: Diagnosis present

## 2022-06-07 DIAGNOSIS — N200 Calculus of kidney: Secondary | ICD-10-CM

## 2022-06-07 DIAGNOSIS — N3281 Overactive bladder: Secondary | ICD-10-CM

## 2022-06-07 LAB — BLADDER SCAN AMB NON-IMAGING

## 2022-06-07 NOTE — Patient Instructions (Signed)
Avoid sodas, diet drinks, and tea as these contribute to increased urinary urgency and frequency.  Avoid any fluid intake 4 hours prior to bedtime, and urinate right before going to bed.   Nocturia refers to the need to wake up during the night to urinate, which can disrupt your sleep and impact your overall well-being. Fortunately, there are several strategies you can employ to help prevent or manage nocturia. It's important to consult with your healthcare provider before making any significant changes to your routine. Here are some helpful strategies to consider:  Limit Fluid Intake Before Bed: Avoid drinking large amounts of fluids in the evening, especially within a few hours of bedtime. Consume most of your daily fluid intake earlier in the day to reduce the need to urinate at night.  Monitor Your Diet: Limit your intake of caffeine and alcohol, as these substances can increase urine production and irritate the bladder.  Avoid diet, "zero calorie," and artificially sweetened drinks, especially sodas, in the afternoon or evening. Be mindful of consuming foods and drinks with high water content before bedtime, such as watermelon and herbal teas.  Time Your Medications: If you're taking medications that contribute to increased urination, consult your healthcare provider about adjusting the timing of these medications to minimize their impact during the night.  Practice Double Voiding: Before going to bed, make an effort to empty your bladder twice within a short period. This can help reduce the amount of urine left in your bladder before sleep.  Bladder Training: Gradually increase the time between bathroom visits during the day to train your bladder to hold larger volumes of urine. Over time, this can help reduce the frequency of nighttime awakenings to urinate.  Elevate Your Legs During the Day: Elevating your legs during the day can help minimize fluid retention in your lower  extremities, which might reduce nighttime urination.  Pelvic Floor Exercises: Strengthening your pelvic floor muscles through Kegel exercises can help improve bladder control and potentially reduce the urge to urinate at night.  Create a Relaxing Bedtime Routine: Stress and anxiety can exacerbate nocturia. Engage in calming activities before bed, such as reading, listening to soothing music, or practicing relaxation techniques.  Stay Active: Engage in regular physical activity, but avoid intense exercise close to bedtime, as this can increase your body's demand for fluids.  Maintain a Healthy Weight: Excess weight can compress the bladder and contribute to bladder and urinary issues. Aim to achieve and maintain a healthy weight through a balanced diet and regular exercise.  Remember that every individual is unique, and the effectiveness of these strategies may vary. It's important to work with your healthcare provider to develop a plan that suits your specific needs and addresses any underlying causes of nocturia.

## 2022-06-07 NOTE — Progress Notes (Signed)
   06/07/2022 2:52 PM   [name] 1946-01-29 [id number]  Reason for visit: Follow up BPH, nephrolithiasis, nocturia  HPI: 76 year old male with long history of urinary symptoms including frequency, urgency, urge incontinence, and nocturia.  He had been on Flomax and finasteride long-term.  He was also trialed on multiple OAB medications previously including anticholinergics and Myrbetriq, though with reportedly no significant improvement in the urgency/frequency.  His wife is here with him today who helps provide much of the history and clarification of his urinary symptoms.  He also has problems with intermittent constipation which exacerbate his urinary symptoms.  He reports his urination is worsened over the last year with increased frequency and nocturia 5-6 times at night.  He does snore and has not been evaluated for sleep apnea, I previously recommended multiple times to be evaluated, as this could certainly be a cause of his persistent nocturia.  He also drinks only tea and soda during the day, and we discussed this likely is a contributor as well to his urinary frequency.  He also is taking his diuretic and blood pressure medications in the evening, I recommended changing that to the morning.  He reportedly has Myrbetriq on his medication list which he thinks has helped urination previously but he has run out of, and he is interested in trying this medication again.  PVR today is normal at 76m.  He also has a history of stone disease and required ureteroscopy on the right side in June 2022, cystoscopy at that time showed a small open prostate with normal cystoscopy.  I personally viewed and interpreted his KUB today that shows stable left lower pole and left upper pole stones, no evidence of ureteral stones.  -Behavioral strategies discussed extensively including avoiding bladder irritants -Again recommended sleep apnea evaluation -Will re-trial Myrbetriq 50 mg daily, samples  given -Can continue maximal medical therapy with Flomax/finasteride for BGratiot MD  [address] [place]  [phone]

## 2022-07-05 ENCOUNTER — Encounter: Payer: Self-pay | Admitting: Urology

## 2022-07-05 ENCOUNTER — Ambulatory Visit (INDEPENDENT_AMBULATORY_CARE_PROVIDER_SITE_OTHER): Payer: Medicare Other | Admitting: Urology

## 2022-07-05 VITALS — BP 130/72 | HR 74 | Ht 69.0 in

## 2022-07-05 DIAGNOSIS — N3281 Overactive bladder: Secondary | ICD-10-CM

## 2022-07-05 DIAGNOSIS — N138 Other obstructive and reflux uropathy: Secondary | ICD-10-CM | POA: Diagnosis not present

## 2022-07-05 DIAGNOSIS — N401 Enlarged prostate with lower urinary tract symptoms: Secondary | ICD-10-CM | POA: Diagnosis not present

## 2022-07-05 LAB — BLADDER SCAN AMB NON-IMAGING

## 2022-07-05 NOTE — Progress Notes (Signed)
   07/05/2022 1:59 PM   Thomas Mcdaniel 05/12/1946 [id number]  Reason for visit: Follow up BPH, nephrolithiasis, nocturia  HPI: 77 year old male with long history of urinary symptoms including frequency, urgency, urge incontinence, and nocturia.  He had been on Flomax and finasteride long-term.  He was also trialed on multiple OAB medications previously including anticholinergics and Myrbetriq, though with reportedly no significant improvement in the urgency/frequency.    At our last visit in December 2023 his wife was with him who helped clarified much of the history.  She reported he was consuming primarily tea and soda during the day and they would work to cut back.  He also was taking his diuretic and blood pressure medications in the evening contributing to his nocturia.  PVR has been normal, including 49m today.  At that visit I had recommended trying the Myrbetriq again as he thought he may have had some improvement on that 1 prior.  50 mg Myrbetriq samples were given x 1 month.  He reports he only took this a few weeks, but really denies any urinary problems at all today and reports he is doing very well.  He has not been on the Myrbetriq for around 2 weeks he thinks.  I also previously recommended a sleep apnea evaluation as possible etiology of his nocturia, but this was never completed,  He also has a history of stone disease and required ureteroscopy on the right side in June 2022, cystoscopy at that time showed a small open prostate with normal cystoscopy.  -Can continue Flomax and finasteride for BPH -Behavioral strategies again discussed -RTC 1 year symptom check, PVR, KUB -If recurrent OAB symptoms, recommend going back on the Myrbetriq 50 mg daily    BBilley Co MD  [address] [place] [phone]

## 2022-09-10 ENCOUNTER — Ambulatory Visit (INDEPENDENT_AMBULATORY_CARE_PROVIDER_SITE_OTHER): Payer: Medicare Other | Admitting: Physician Assistant

## 2022-09-10 VITALS — BP 127/72 | HR 81 | Ht 69.0 in | Wt 240.0 lb

## 2022-09-10 DIAGNOSIS — R350 Frequency of micturition: Secondary | ICD-10-CM

## 2022-09-10 LAB — URINALYSIS, COMPLETE
Bilirubin, UA: NEGATIVE
Glucose, UA: NEGATIVE
Ketones, UA: NEGATIVE
Leukocytes,UA: NEGATIVE
Nitrite, UA: NEGATIVE
Protein,UA: NEGATIVE
Specific Gravity, UA: 1.02 (ref 1.005–1.030)
Urobilinogen, Ur: 0.2 mg/dL (ref 0.2–1.0)
pH, UA: 5.5 (ref 5.0–7.5)

## 2022-09-10 LAB — MICROSCOPIC EXAMINATION

## 2022-09-10 LAB — BLADDER SCAN AMB NON-IMAGING: Scan Result: 12

## 2022-09-10 MED ORDER — MIRABEGRON ER 50 MG PO TB24: 50.0000 mg | ORAL_TABLET | Freq: Every day | ORAL | 0 refills | Status: DC

## 2022-09-10 NOTE — Progress Notes (Signed)
09/10/2022 12:13 PM   Thomas Mcdaniel 27-Oct-1945 [id number]  CC: Chief Complaint  Patient presents with   Follow-up   HPI: Thomas Mcdaniel is a 77 y.o. 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   Today he reports an approximate 1 week history of worsening urinary frequency, urgency, and nocturia.  He denies dysuria, gross hematuria, flank pain, edema, medication changes, dietary changes, or bowel changes.  Dr. Diamantina Providence has previously recommended sleep apnea evaluation, but this has not been performed.  Patient provided a urine sample over the lunch hour, results not available during appointment. PVR 36mL.  PMH: Past Medical History:  Diagnosis Date   Acquired spondylolisthesis    BPH (benign prostatic hyperplasia)    Bradycardia    Burn    Bursitis    right elbow    Cancer ([place])    skin cancer   Complication of anesthesia    difficult to wake up after a lithotripsy   DJD (degenerative joint disease)    Foot drop    bil.   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    Nephrolithiasis    Obesity    Peripheral polyneuropathy    Personal history of PE (pulmonary embolism)    PONV (postoperative nausea and vomiting)    after cataract surgery   Pre-diabetes    Scoliosis    Sleep apnea    pt denies   SOB (shortness of breath)    Spinal stenosis, lumbar region without neurogenic claudication    Urine frequency    UTI (urinary tract infection) during pregnancy, unspecified trimester      Surgical History: Past Surgical History:  Procedure Laterality Date   CATARACT EXTRACTION     CATARACT EXTRACTION W/PHACO Right 08/25/2019   Procedure: CATARACT EXTRACTION PHACO AND INTRAOCULAR LENS PLACEMENT ([place]) MALYUGIN RIGHT 7.94  00:48.5;  Surgeon: Birder Robson, MD;  Location: [facility];  Service: Ophthalmology;  Laterality: Right;   COLONOSCOPY WITH PROPOFOL N/A 08/01/2015   Procedure: COLONOSCOPY WITH PROPOFOL;  Surgeon: Josefine Class, MD;  Location: [facility] ENDOSCOPY;  Service: Endoscopy;  Laterality: N/A;   COLONOSCOPY WITH PROPOFOL N/A 09/25/2019   Procedure: COLONOSCOPY WITH PROPOFOL;  Surgeon: Robert Bellow, MD;  Location: [facility] ENDOSCOPY;  Service: Endoscopy;  Laterality: N/A;   CORONARY PRESSURE WIRE/FFR WITH 3D MAPPING N/A 12/07/2021   Procedure: Coronary Pressure Wire/FFR w/3D Mapping;  Surgeon: Isaias Cowman, MD;  Location: [facility];  Service: Cardiovascular;  Laterality: N/A;   CYSTOSCOPY/URETEROSCOPY/HOLMIUM LASER/STENT PLACEMENT Right 12/16/2020   Procedure: CYSTOSCOPY/URETEROSCOPY/HOLMIUM LASER/STENT PLACEMENT;  Surgeon: Billey Co, MD;  Location: [facility] ORS;  Service: Urology;  Laterality: Right;   DIAGNOSTIC LAPAROSCOPY     FEMORAL ARTERY REPAIR     HERNIA REPAIR     JOINT REPLACEMENT Right    knee   KNEE ARTHROPLASTY Right 12/05/2015   Procedure: COMPUTER ASSISTED TOTAL KNEE ARTHROPLASTY;  Surgeon: Dereck Leep, MD;  Location: [facility]  ORS;  Service: Orthopedics;  Laterality: Right;   knee arthroscopy     LAMINECTOMY     LEFT HEART CATH AND CORONARY ANGIOGRAPHY N/A 12/07/2021   Procedure: LEFT HEART CATH AND CORONARY ANGIOGRAPHY;  Surgeon: Isaias Cowman, MD;  Location: [facility];  Service: Cardiovascular;  Laterality: N/A;   LITHOTRIPSY     LITHOTRIPSY     SPINAL FUSION     TOE AMPUTATION     TONSILLECTOMY      Home Medications:  Allergies as of 09/10/2022   No Known Allergies      Medication  List        Accurate as of September 10, 2022 12:13 PM. If you have any questions, ask your nurse or doctor.          allopurinol 300 MG tablet Commonly known as: ZYLOPRIM Take 300 mg by mouth daily as needed (gout flare).   finasteride 5 MG tablet Commonly known as: PROSCAR Take 5 mg by mouth daily.   losartan 100 MG tablet Commonly known as: COZAAR Take 100 mg by mouth at bedtime.   memantine 5 MG tablet Commonly known as: NAMENDA Take 5 mg by mouth 2 (two) times daily.   metroNIDAZOLE 250 MG tablet Commonly known as: FLAGYL Take by mouth.   mirtazapine 15 MG tablet Commonly known as: REMERON Take 15 mg by mouth at bedtime.   pregabalin 25 MG capsule Commonly known as: LYRICA Take 25 mg by mouth daily as needed (1 to 2 capsules as needed).   rosuvastatin 5 MG tablet Commonly known as: CRESTOR Take 5 mg by mouth daily.   senna-docusate 8.6-50 MG tablet Commonly known as: Senokot-S Take 1 tablet by mouth 2 (two) times daily. Decrease to 1x daily or hold for diarrhea/loose stools   spironolactone 25 MG tablet Commonly known as: ALDACTONE Take 25 mg by mouth at bedtime.   tamsulosin 0.4 MG Caps capsule Commonly known as: FLOMAX Take 0.4 mg by mouth daily.        Allergies:  No Known Allergies  Family History: Family History  Problem Relation Age of Onset   Prostate cancer Neg Hx    Kidney disease Neg Hx     Social History:   reports that he quit smoking about 30 years ago. His smoking use included cigarettes. He has a 51.00 pack-year smoking history. He has been exposed to tobacco smoke. He has never used smokeless tobacco. He reports that he does not drink alcohol and does not use drugs.  Physical Exam: BP 127/72   Pulse 81   Ht 5\' 9"  (1.753 m)   Wt 240 lb (108.9 kg)   BMI 35.44 kg/m   Constitutional:  Alert and oriented, no acute distress, nontoxic appearing HEENT: [place], AT Cardiovascular: No clubbing, cyanosis, or edema Respiratory: Normal  respiratory effort, no increased work of breathing Skin: No rashes, bruises or suspicious lesions Neurologic: Grossly intact, no focal deficits, moving all 4 extremities Psychiatric: Normal mood and affect  Laboratory Data: Results for orders placed or performed in visit on 09/10/22  BLADDER SCAN AMB NON-IMAGING  Result Value Ref Range   Scan Result 12 ml    Assessment & Plan:   1. Urinary frequency PVR WNL.  Acute worsening in frequency, urgency, and nocturia with no apparent other changes.  He denies infective symptoms, we will call him with his UA results when available and treat as indicated.  He denies flank pain, low suspicion for acute stone episode, though will consider  further workup for this if he is noted to have hematuria.  We discussed resuming Myrbetriq and he agreed.  I provided him with samples today.  He can keep follow-up as planned. - BLADDER SCAN AMB NON-IMAGING - Urinalysis, Complete - mirabegron ER (MYRBETRIQ) 50 MG TB24 tablet; Take 1 tablet (50 mg total) by mouth daily.  Dispense: 28 tablet; Refill: 0   Return for Will call with results.  Debroah Loop, PA-C  [facility] [address], [place], [place] [address] [phone]

## 2022-09-11 ENCOUNTER — Telehealth: Payer: Self-pay

## 2022-09-11 NOTE — Telephone Encounter (Signed)
Pt is requesting u/a results.   Pls advise.

## 2022-09-20 ENCOUNTER — Encounter: Payer: Self-pay | Admitting: Urology

## 2022-09-20 ENCOUNTER — Ambulatory Visit (INDEPENDENT_AMBULATORY_CARE_PROVIDER_SITE_OTHER): Payer: Medicare Other | Admitting: Urology

## 2022-09-20 VITALS — BP 156/84 | HR 68 | Ht 69.0 in | Wt 245.7 lb

## 2022-09-20 DIAGNOSIS — N3281 Overactive bladder: Secondary | ICD-10-CM

## 2022-09-20 DIAGNOSIS — R350 Frequency of micturition: Secondary | ICD-10-CM

## 2022-09-20 DIAGNOSIS — R351 Nocturia: Secondary | ICD-10-CM | POA: Diagnosis not present

## 2022-09-20 LAB — URINALYSIS, COMPLETE
Bilirubin, UA: NEGATIVE
Glucose, UA: NEGATIVE
Ketones, UA: NEGATIVE
Nitrite, UA: NEGATIVE
Protein,UA: NEGATIVE
RBC, UA: NEGATIVE
Specific Gravity, UA: 1.02 (ref 1.005–1.030)
Urobilinogen, Ur: 0.2 mg/dL (ref 0.2–1.0)
pH, UA: 5.5 (ref 5.0–7.5)

## 2022-09-20 LAB — MICROSCOPIC EXAMINATION

## 2022-09-20 LAB — BLADDER SCAN AMB NON-IMAGING

## 2022-09-20 MED ORDER — MIRABEGRON ER 50 MG PO TB24: 50.0000 mg | ORAL_TABLET | Freq: Every day | ORAL | 11 refills | Status: DC

## 2022-09-20 NOTE — Patient Instructions (Signed)
You need to cut back on sodas, tea, and any diet drinks.  This is worsening your urinary symptoms.  Do not drink any fluids 3 to 4 hours before bedtime, and urinate right before going to bed.  Continue taking the Myrbetriq.  This can help with urgency and frequency of urination, but takes at least 2 weeks to start to work.  This is a medication I expect you will need to continue taking long-term.  I recommend wearing depends overnight until things improve with the above strategies.

## 2022-09-20 NOTE — Progress Notes (Signed)
   09/20/2022 10:11 AM   Thomas Mcdaniel [birth year] [id number]  Reason for visit: Follow up BPH, nephrolithiasis, nocturia, OAB/urgency frequency  HPI: 77 year old male with long history of urinary symptoms including frequency, urgency, urge incontinence, and nocturia.  He had been on Flomax and finasteride long-term.  He was also trialed on multiple OAB medications previously including anticholinergics and Myrbetriq, though with reportedly no significant improvement in the urgency/frequency.    At our last visit in January 2024 he was doing very well with really no significant urinary complaints aside from nocturia 1-2 times overnight.  I have recommended sleep apnea evaluations numerous times and this has never been completed.  PVRs have always been normal.  He had ureteroscopy with me in June 2022 for kidney stones and prostate was small and wide open with no evidence of obstruction.  He saw our PA [facility] on 09/10/2022 for increased urgency and frequency during the day, as well as a few episodes of urge incontinence and nocturnal enuresis overnight.  He also was having some bowel problems and constipation at that time.  PVR was again normal, UA just showed some microscopic hematuria but no evidence of infection.  She appropriately recommended a trial of 50 mg Myrbetriq.  He made an appointment with me today to discuss other options and he wanted another opinion.  He has only been on the Myrbetriq for about 1 week.  We discussed this typically takes at least 2 weeks to see an improvement.  He continues to drink primarily soda and tea during the day, including tea right before bed while he watches TV.  I had a very frank conversation with the patient and his wife today that a lot of his urinary symptoms are likely behavioral related with his high intake of bladder irritants.  I again recommended a sleep apnea evaluation.  We discussed that Myrbetriq typically takes at least 2 weeks to  see improvement, and I recommended he continue on that medication, as there are some notes previously that documented he had improvement on that medication.  He is a very challenging historian which makes his care very difficult.  Urinalysis today is completely benign, will send for culture for thoroughness.  -Continue baseline Flomax and finasteride -Continue recently prescribed Myrbetriq 50 mg daily -Again recommended sleep apnea evaluation -Bed alarm or overnight condom catheter would be another option for his nocturnal enuresis if this continues -RTC 4 to 6 weeks PA symptom check, could consider PTNS at that time if persistent overactive symptoms   Billey Co, MD  [facility] [address] [place], [place] [address] [phone]

## 2022-09-23 ENCOUNTER — Other Ambulatory Visit: Payer: Self-pay

## 2022-09-23 ENCOUNTER — Observation Stay: Payer: Medicare Other

## 2022-09-23 ENCOUNTER — Inpatient Hospital Stay
Admission: EM | Admit: 2022-09-23 | Discharge: 2022-09-27 | DRG: 493 | Disposition: A | Discharge status: Home Health | Payer: Medicare Other | Attending: Internal Medicine | Admitting: Internal Medicine

## 2022-09-23 ENCOUNTER — Encounter: Payer: Self-pay | Admitting: Emergency Medicine

## 2022-09-23 ENCOUNTER — Emergency Department: Payer: Medicare Other

## 2022-09-23 DIAGNOSIS — Z79899 Other long term (current) drug therapy: Secondary | ICD-10-CM

## 2022-09-23 DIAGNOSIS — I10 Essential (primary) hypertension: Secondary | ICD-10-CM | POA: Diagnosis not present

## 2022-09-23 DIAGNOSIS — S82852A Displaced trimalleolar fracture of left lower leg, initial encounter for closed fracture: Principal | ICD-10-CM | POA: Diagnosis present

## 2022-09-23 DIAGNOSIS — F05 Delirium due to known physiological condition: Secondary | ICD-10-CM | POA: Diagnosis present

## 2022-09-23 DIAGNOSIS — Z9181 History of falling: Secondary | ICD-10-CM

## 2022-09-23 DIAGNOSIS — M25572 Pain in left ankle and joints of left foot: Secondary | ICD-10-CM | POA: Diagnosis not present

## 2022-09-23 DIAGNOSIS — N400 Enlarged prostate without lower urinary tract symptoms: Secondary | ICD-10-CM | POA: Diagnosis not present

## 2022-09-23 DIAGNOSIS — Y92017 Garden or yard in single-family (private) house as the place of occurrence of the external cause: Secondary | ICD-10-CM

## 2022-09-23 DIAGNOSIS — D696 Thrombocytopenia, unspecified: Secondary | ICD-10-CM | POA: Diagnosis not present

## 2022-09-23 DIAGNOSIS — W19XXXA Unspecified fall, initial encounter: Secondary | ICD-10-CM

## 2022-09-23 DIAGNOSIS — N179 Acute kidney failure, unspecified: Secondary | ICD-10-CM | POA: Diagnosis not present

## 2022-09-23 DIAGNOSIS — R7303 Prediabetes: Secondary | ICD-10-CM | POA: Diagnosis present

## 2022-09-23 DIAGNOSIS — Z86711 Personal history of pulmonary embolism: Secondary | ICD-10-CM

## 2022-09-23 DIAGNOSIS — E876 Hypokalemia: Secondary | ICD-10-CM | POA: Diagnosis present

## 2022-09-23 DIAGNOSIS — M109 Gout, unspecified: Secondary | ICD-10-CM | POA: Diagnosis present

## 2022-09-23 DIAGNOSIS — Z87891 Personal history of nicotine dependence: Secondary | ICD-10-CM

## 2022-09-23 DIAGNOSIS — R296 Repeated falls: Secondary | ICD-10-CM | POA: Diagnosis present

## 2022-09-23 DIAGNOSIS — E669 Obesity, unspecified: Secondary | ICD-10-CM | POA: Diagnosis present

## 2022-09-23 DIAGNOSIS — R410 Disorientation, unspecified: Secondary | ICD-10-CM | POA: Diagnosis not present

## 2022-09-23 DIAGNOSIS — M419 Scoliosis, unspecified: Secondary | ICD-10-CM | POA: Diagnosis present

## 2022-09-23 DIAGNOSIS — Z87442 Personal history of urinary calculi: Secondary | ICD-10-CM

## 2022-09-23 DIAGNOSIS — G4733 Obstructive sleep apnea (adult) (pediatric): Secondary | ICD-10-CM | POA: Diagnosis present

## 2022-09-23 DIAGNOSIS — G629 Polyneuropathy, unspecified: Secondary | ICD-10-CM | POA: Diagnosis present

## 2022-09-23 DIAGNOSIS — Z6836 Body mass index (BMI) 36.0-36.9, adult: Secondary | ICD-10-CM

## 2022-09-23 DIAGNOSIS — F03A11 Unspecified dementia, mild, with agitation: Secondary | ICD-10-CM | POA: Diagnosis present

## 2022-09-23 DIAGNOSIS — E785 Hyperlipidemia, unspecified: Secondary | ICD-10-CM | POA: Diagnosis present

## 2022-09-23 DIAGNOSIS — Z781 Physical restraint status: Secondary | ICD-10-CM

## 2022-09-23 DIAGNOSIS — Z85828 Personal history of other malignant neoplasm of skin: Secondary | ICD-10-CM

## 2022-09-23 DIAGNOSIS — R7302 Impaired glucose tolerance (oral): Secondary | ICD-10-CM | POA: Insufficient documentation

## 2022-09-23 DIAGNOSIS — S93432A Sprain of tibiofibular ligament of left ankle, initial encounter: Secondary | ICD-10-CM | POA: Diagnosis present

## 2022-09-23 DIAGNOSIS — S82892A Other fracture of left lower leg, initial encounter for closed fracture: Secondary | ICD-10-CM | POA: Diagnosis not present

## 2022-09-23 DIAGNOSIS — Z981 Arthrodesis status: Secondary | ICD-10-CM

## 2022-09-23 DIAGNOSIS — E538 Deficiency of other specified B group vitamins: Secondary | ICD-10-CM | POA: Diagnosis present

## 2022-09-23 DIAGNOSIS — S82899A Other fracture of unspecified lower leg, initial encounter for closed fracture: Secondary | ICD-10-CM | POA: Diagnosis present

## 2022-09-23 DIAGNOSIS — H9190 Unspecified hearing loss, unspecified ear: Secondary | ICD-10-CM | POA: Diagnosis present

## 2022-09-23 DIAGNOSIS — N3281 Overactive bladder: Secondary | ICD-10-CM | POA: Diagnosis present

## 2022-09-23 DIAGNOSIS — W010XXA Fall on same level from slipping, tripping and stumbling without subsequent striking against object, initial encounter: Secondary | ICD-10-CM | POA: Diagnosis present

## 2022-09-23 DIAGNOSIS — Z96651 Presence of right artificial knee joint: Secondary | ICD-10-CM | POA: Diagnosis present

## 2022-09-23 LAB — CBC WITH DIFFERENTIAL/PLATELET
Abs Immature Granulocytes: 0.03 10*3/uL (ref 0.00–0.07)
Basophils Absolute: 0 10*3/uL (ref 0.0–0.1)
Basophils Relative: 1 %
Eosinophils Absolute: 0.2 10*3/uL (ref 0.0–0.5)
Eosinophils Relative: 2 %
HCT: 42 % (ref 39.0–52.0)
Hemoglobin: 14.3 g/dL (ref 13.0–17.0)
Immature Granulocytes: 0 %
Lymphocytes Relative: 22 %
Lymphs Abs: 1.7 10*3/uL (ref 0.7–4.0)
MCH: 31 pg (ref 26.0–34.0)
MCHC: 34 g/dL (ref 30.0–36.0)
MCV: 90.9 fL (ref 80.0–100.0)
Monocytes Absolute: 0.6 10*3/uL (ref 0.1–1.0)
Monocytes Relative: 8 %
Neutro Abs: 5.1 10*3/uL (ref 1.7–7.7)
Neutrophils Relative %: 67 %
Platelets: 138 10*3/uL — ABNORMAL LOW (ref 150–400)
RBC: 4.62 MIL/uL (ref 4.22–5.81)
RDW: 13.7 % (ref 11.5–15.5)
WBC: 7.5 10*3/uL (ref 4.0–10.5)
nRBC: 0 % (ref 0.0–0.2)

## 2022-09-23 LAB — COMPREHENSIVE METABOLIC PANEL
ALT: 24 U/L (ref 0–44)
AST: 35 U/L (ref 15–41)
Albumin: 4 g/dL (ref 3.5–5.0)
Alkaline Phosphatase: 54 U/L (ref 38–126)
Anion gap: 7 (ref 5–15)
BUN: 22 mg/dL (ref 8–23)
CO2: 23 mmol/L (ref 22–32)
Calcium: 9.4 mg/dL (ref 8.9–10.3)
Chloride: 107 mmol/L (ref 98–111)
Creatinine, Ser: 1.08 mg/dL (ref 0.61–1.24)
GFR, Estimated: >60 mL/min (ref >60)
Glucose, Bld: 129 mg/dL — ABNORMAL HIGH (ref 70–99)
Potassium: 3.9 mmol/L (ref 3.5–5.1)
Sodium: 137 mmol/L (ref 135–145)
Total Bilirubin: 0.8 mg/dL (ref 0.3–1.2)
Total Protein: 7 g/dL (ref 6.5–8.1)

## 2022-09-23 LAB — TROPONIN I (HIGH SENSITIVITY)
Troponin I (High Sensitivity): 4 ng/L (ref <18)
Troponin I (High Sensitivity): 4 ng/L (ref <18)

## 2022-09-23 MED ORDER — ROSUVASTATIN CALCIUM 5 MG PO TABS
5.0000 mg | ORAL_TABLET | Freq: Every day | ORAL | Status: DC
  Administered 2022-09-23 – 2022-09-27 (×4): 5 mg via ORAL
  Filled 2022-09-23 (×6): qty 1

## 2022-09-23 MED ORDER — CHLORHEXIDINE GLUCONATE 4 % EX LIQD: 60.0000 mL | Freq: Once | CUTANEOUS | Status: DC

## 2022-09-23 MED ORDER — ACETAMINOPHEN 325 MG PO TABS
650.0000 mg | ORAL_TABLET | Freq: Four times a day (QID) | ORAL | Status: DC | PRN
  Administered 2022-09-23: 650 mg via ORAL
  Filled 2022-09-23: qty 2

## 2022-09-23 MED ORDER — MIRTAZAPINE 15 MG PO TABS
15.0000 mg | ORAL_TABLET | Freq: Every day | ORAL | Status: DC
  Administered 2022-09-23 – 2022-09-26 (×3): 15 mg via ORAL
  Filled 2022-09-23 (×3): qty 1

## 2022-09-23 MED ORDER — HYDROMORPHONE HCL 1 MG/ML IJ SOLN: 1.0000 mg | INTRAMUSCULAR | Status: DC | PRN

## 2022-09-23 MED ORDER — HYDROCODONE-ACETAMINOPHEN 5-325 MG PO TABS
1.0000 | ORAL_TABLET | Freq: Four times a day (QID) | ORAL | Status: DC | PRN
  Administered 2022-09-23 – 2022-09-24 (×2): 2 via ORAL
  Administered 2022-09-25 – 2022-09-27 (×2): 1 via ORAL
  Filled 2022-09-23 (×2): qty 1
  Filled 2022-09-23: qty 2
  Filled 2022-09-23: qty 1
  Filled 2022-09-23: qty 2

## 2022-09-23 MED ORDER — ORAL CARE MOUTH RINSE: 15.0000 mL | OROMUCOSAL | Status: DC | PRN

## 2022-09-23 MED ORDER — ONDANSETRON HCL 4 MG/2ML IJ SOLN
4.0000 mg | Freq: Four times a day (QID) | INTRAMUSCULAR | Status: DC | PRN
  Administered 2022-09-23: 4 mg via INTRAVENOUS
  Filled 2022-09-23 (×2): qty 2

## 2022-09-23 MED ORDER — POVIDONE-IODINE 10 % EX SWAB: 2.0000 | Freq: Once | CUTANEOUS | Status: DC

## 2022-09-23 MED ORDER — ACETAMINOPHEN 650 MG RE SUPP: 650.0000 mg | Freq: Four times a day (QID) | RECTAL | Status: DC | PRN

## 2022-09-23 MED ORDER — SPIRONOLACTONE 25 MG PO TABS: 25.0000 mg | ORAL_TABLET | Freq: Every day | ORAL | Status: DC

## 2022-09-23 MED ORDER — CEFAZOLIN IN SODIUM CHLORIDE 3-0.9 GM/100ML-% IV SOLN
3.0000 g | INTRAVENOUS | Status: DC
  Filled 2022-09-23 (×2): qty 100

## 2022-09-23 MED ORDER — FENTANYL CITRATE PF 50 MCG/ML IJ SOSY
50.0000 ug | PREFILLED_SYRINGE | INTRAMUSCULAR | Status: DC | PRN
  Administered 2022-09-23: 50 ug via INTRAVENOUS
  Filled 2022-09-23: qty 1

## 2022-09-23 MED ORDER — MEMANTINE HCL 5 MG PO TABS
5.0000 mg | ORAL_TABLET | Freq: Two times a day (BID) | ORAL | Status: DC
  Administered 2022-09-23 – 2022-09-27 (×6): 5 mg via ORAL
  Filled 2022-09-23 (×7): qty 1

## 2022-09-23 MED ORDER — HYDROMORPHONE HCL 1 MG/ML IJ SOLN
1.0000 mg | INTRAMUSCULAR | Status: DC | PRN
  Administered 2022-09-23 – 2022-09-24 (×2): 1 mg via INTRAVENOUS
  Filled 2022-09-23 (×2): qty 1

## 2022-09-23 MED ORDER — FINASTERIDE 5 MG PO TABS
5.0000 mg | ORAL_TABLET | Freq: Every day | ORAL | Status: DC
  Administered 2022-09-23 – 2022-09-27 (×4): 5 mg via ORAL
  Filled 2022-09-23 (×6): qty 1

## 2022-09-23 MED ORDER — LOSARTAN POTASSIUM 50 MG PO TABS: 100.0000 mg | ORAL_TABLET | Freq: Every day | ORAL | Status: DC

## 2022-09-23 MED ORDER — PROPOFOL 10 MG/ML IV BOLUS
1.0000 mg/kg | Freq: Once | INTRAVENOUS | Status: AC
  Administered 2022-09-23: 156.9 mg via INTRAVENOUS
  Filled 2022-09-23: qty 20

## 2022-09-23 MED ORDER — SODIUM CHLORIDE 0.9% FLUSH
3.0000 mL | Freq: Two times a day (BID) | INTRAVENOUS | Status: DC
  Administered 2022-09-23 (×2): 3 mL via INTRAVENOUS

## 2022-09-23 MED ORDER — MIRABEGRON ER 50 MG PO TB24
50.0000 mg | ORAL_TABLET | Freq: Every day | ORAL | Status: DC
  Administered 2022-09-26 – 2022-09-27 (×2): 50 mg via ORAL
  Filled 2022-09-23 (×6): qty 1

## 2022-09-23 MED ORDER — TAMSULOSIN HCL 0.4 MG PO CAPS
0.4000 mg | ORAL_CAPSULE | Freq: Every day | ORAL | Status: DC
  Administered 2022-09-23 – 2022-09-27 (×4): 0.4 mg via ORAL
  Filled 2022-09-23 (×5): qty 1

## 2022-09-23 NOTE — ED Triage Notes (Signed)
Pt via [facility] from home. 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 Denies LOC. Denies head injuries. Denies blood thinners. EMS reports obvious deformity to the L ankle that is currently splinted. Pt also has laceration to the L elbow. EMS started 18G in L forearm and reports 100mg  of Fentanyl. Pt is A&Ox4 and NAD

## 2022-09-23 NOTE — Assessment & Plan Note (Signed)
Per chart review, patient does not wear CPAP.

## 2022-09-23 NOTE — Assessment & Plan Note (Signed)
-   Continue home losartan and spironolactone

## 2022-09-23 NOTE — ED Notes (Signed)
Advised nurse that patient ready bed

## 2022-09-23 NOTE — Assessment & Plan Note (Addendum)
Per chart review, patient has a history of frequent falls in the setting of polyneuropathy.  Last B12 check approximately 3 months ago with significantly decreased B12 at 156.  This may be contributing to his frequency of falls.  - PT/OT when cleared by podiatry - Vitamin B12 pending

## 2022-09-23 NOTE — H&P (Addendum)
History and Physical    Patient: Thomas Mcdaniel [id number] DOB: 04/18/1946 DOA: 09/23/2022 DOS: the patient was seen and examined on 09/23/2022 PCP: Valera Castle, MD  Patient coming from: Home  Chief Complaint:  Chief Complaint  Patient presents with   Fall   Ankle Pain   HPI: Thomas Mcdaniel is a 77 y.o. male with medical history significant of hypertension, overactive bladder, B12 deficiency, neuropathy with frequent falls, who presents to the ED after a ground-level fall.  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  When he tripped, he fell over and heard a loud pop from his left ankle.  He denies hitting his head or any loss of consciousness.  He has been unable to bear weight on the left ankle since the fall.  He denies any dizziness, headache, palpitations, chest pain or shortness of breath before or after the fall.  ED course: On arrival to the ED, patient was normotensive at 136/82 with heart rate of 70.  He was saturating at 100% on room air.  He was afebrile at 98.3.  Initial workup notable for WBC of 7.5, hemoglobin 14.3, platelets 138, potassium 3.9, bicarb 23, glucose 129, creatinine 1.08 and GFR above 60. Left ankle x-ray was obtained that demonstrated trimalleolar fracture with dislocation.  Under conscious sedation, left ankle was reapproximated by EDP.  Podiatry and Ortho surgery consulted.  TRH contacted for admission.  Review of Systems: As mentioned in the history of present illness. All other systems reviewed and are negative.  Past Medical History:  Diagnosis Date   Acquired spondylolisthesis    BPH (benign prostatic hyperplasia)    Bradycardia    Burn    Bursitis    right elbow    Cancer ([place])    skin cancer   Complication of anesthesia    difficult to wake up after a lithotripsy   DJD (degenerative joint disease)    Foot drop    bil.   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    Nephrolithiasis    Obesity    Peripheral polyneuropathy    Personal history of PE (pulmonary embolism)    PONV (postoperative nausea and vomiting)    after cataract surgery   Pre-diabetes    Scoliosis    Sleep apnea    pt denies   SOB (shortness of breath)    Spinal stenosis, lumbar region without neurogenic claudication    Urine frequency    UTI (urinary tract infection) during pregnancy, unspecified trimester    Past Surgical History:  Procedure Laterality Date   CATARACT EXTRACTION     CATARACT EXTRACTION W/PHACO Right 08/25/2019   Procedure: CATARACT EXTRACTION PHACO AND INTRAOCULAR LENS PLACEMENT ([place]) MALYUGIN RIGHT 7.94  00:48.5;  Surgeon: Birder Robson, MD;  Location: [facility];  Service: Ophthalmology;  Laterality: Right;   COLONOSCOPY WITH PROPOFOL N/A 08/01/2015   Procedure: COLONOSCOPY WITH PROPOFOL;  Surgeon: Josefine Class, MD;  Location:  [facility];  Service: Endoscopy;  Laterality: N/A;   COLONOSCOPY WITH PROPOFOL N/A 09/25/2019   Procedure: COLONOSCOPY WITH PROPOFOL;  Surgeon: Robert Bellow, MD;  Location: [facility] ENDOSCOPY;  Service: Endoscopy;  Laterality: N/A;   CORONARY PRESSURE WIRE/FFR WITH 3D MAPPING N/A 12/07/2021   Procedure: Coronary Pressure Wire/FFR w/3D Mapping;  Surgeon: Isaias Cowman, MD;  Location: [facility];  Service: Cardiovascular;  Laterality: N/A;   CYSTOSCOPY/URETEROSCOPY/HOLMIUM LASER/STENT PLACEMENT Right 12/16/2020   Procedure: CYSTOSCOPY/URETEROSCOPY/HOLMIUM LASER/STENT PLACEMENT;  Surgeon: Billey Co, MD;   Location: [facility] ORS;  Service: Urology;  Laterality: Right;   DIAGNOSTIC LAPAROSCOPY     FEMORAL ARTERY REPAIR     HERNIA REPAIR     JOINT REPLACEMENT Right    knee   KNEE ARTHROPLASTY Right 12/05/2015   Procedure: COMPUTER ASSISTED TOTAL KNEE ARTHROPLASTY;  Surgeon: Dereck Leep, MD;  Location: [facility] ORS;  Service: Orthopedics;  Laterality: Right;   knee arthroscopy     LAMINECTOMY     LEFT HEART CATH AND CORONARY ANGIOGRAPHY N/A 12/07/2021   Procedure: LEFT HEART CATH AND CORONARY ANGIOGRAPHY;  Surgeon: Isaias Cowman, MD;  Location: [facility];  Service: Cardiovascular;  Laterality: N/A;   LITHOTRIPSY     LITHOTRIPSY     SPINAL FUSION     TOE AMPUTATION     TONSILLECTOMY     Social History:  reports that he quit smoking about 30 years ago. His smoking use included cigarettes. He has a 51.00 pack-year smoking history. He has been exposed to tobacco smoke. He has never used smokeless tobacco. He reports that he does not drink alcohol and does not use drugs.  No Known Allergies  Family History  Problem Relation Age of Onset   Prostate cancer Neg Hx    Kidney disease Neg Hx     Prior to Admission medications   Medication Sig Start Date End Date Taking? Authorizing Provider  allopurinol (ZYLOPRIM) 300 MG tablet Take 300 mg by mouth daily as needed (gout flare).    [provider]  diclofenac Sodium (VOLTAREN) 1 % GEL Apply 2 g topically 2 (two) times daily. 09/01/22   [provider]  finasteride (PROSCAR) 5 MG tablet Take 5 mg by mouth daily. 08/05/22   [provider]  losartan (COZAAR) 100 MG tablet Take 100 mg by mouth at bedtime. 07/14/20   [provider]  memantine (NAMENDA) 5 MG tablet Take 5 mg by mouth 2 (two) times daily. 08/27/22   [provider]  mirabegron ER (MYRBETRIQ) 50 MG TB24 tablet Take 1 tablet (50 mg total) by mouth daily. 09/20/22   Billey Co, MD  mirtazapine (REMERON) 15 MG tablet Take 15 mg by  mouth at bedtime. 10/26/21   [provider]  pregabalin (LYRICA) 25 MG capsule Take 25 mg by mouth daily as needed (1 to 2 capsules as needed).    [provider]  rosuvastatin (CRESTOR) 5 MG tablet Take 5 mg by mouth daily.    [provider]  senna-docusate (SENOKOT-S) 8.6-50 MG tablet Take 1 tablet by mouth 2 (two) times daily. Decrease to 1x daily or hold for diarrhea/loose stools 02/22/21   Billey Co, MD  spironolactone (ALDACTONE) 25 MG tablet Take 25 mg by mouth at bedtime. 09/05/20   [provider]  tamsulosin (FLOMAX) 0.4 MG CAPS capsule Take 0.4 mg by mouth daily.    [provider]  famotidine (PEPCID) 40 MG tablet Take 40 mg by mouth  daily as needed.   01/12/20  [provider]    Physical Exam: Vitals:   09/23/22 1517 09/23/22 1520 09/23/22 1522 09/23/22 1535  BP:   120/63 107/70  Pulse: 81 78 79 66  Resp:  17 15 (!) 21  Temp:      SpO2:  91% 94% 96%  Weight:      Height:       Physical Exam Vitals and nursing note reviewed.  Constitutional:      General: He is not in acute distress.    Appearance: He is obese. He is not toxic-appearing.  HENT:     Head: Normocephalic and atraumatic.     Nose:     Comments: Rhinophyma noted    Mouth/Throat:     Mouth: Mucous membranes are moist.     Pharynx: Oropharynx is clear.  Cardiovascular:     Rate and Rhythm: Normal rate and regular rhythm.     Heart sounds: No murmur heard.    No gallop.     Comments: No peripheral edema Pulmonary:     Effort: Pulmonary effort is normal. No respiratory distress.     Breath sounds: Normal breath sounds. No wheezing, rhonchi or rales.  Abdominal:     General: Bowel sounds are normal.     Palpations: Abdomen is soft.  Skin:    General: Skin is warm and dry.  Neurological:     Mental Status: He is alert and oriented to person, place, and time. Mental status is at baseline.  Psychiatric:        Mood and Affect: Mood normal.         Behavior: Behavior normal.    Data Reviewed: CBC with WBC of 7.5, hemoglobin of 14.3, platelets of 138 CMP with sodium of 137, potassium 3.9, bicarb 23, glucose 129, BUN 22, creatinine 1.08, AST 35, ALT 24 and GFR above 60 Troponin negative at 4  DG Ankle 2 Views Left  Result Date: 09/23/2022 CLINICAL DATA:  Postreduction. EXAM: LEFT ANKLE - 2 VIEW COMPARISON:  Ankle and lower leg radiographs earlier the same date. FINDINGS: 1525 hours. Interval improved alignment of the previously demonstrated trimalleolar fracture with near anatomic reduction. The fracture of the distal fibular diaphysis is mildly comminuted. There is a possible small fracture fragment between the medial aspect of the tibial plafond and talar dome. No significant residual subluxation of the talus. Associated soft tissue injury. IMPRESSION: Improved alignment of the trimalleolar fracture post reduction as described. Electronically Signed   By: Richardean Sale M.D.   On: 09/23/2022 15:40   DG Tibia/Fibula Left  Result Date: 09/23/2022 CLINICAL DATA:  [place].  Pain and deformity. EXAM: LEFT TIBIA AND FIBULA - 2 VIEW COMPARISON:  Ankle images same day FINDINGS: Proximal tibia and fibula are negative. No evidence of knee injury. See ankle report regarding trimalleolar fracture dislocation. IMPRESSION: Proximal tibia and fibula are negative. See ankle report regarding trimalleolar fracture dislocation. Electronically Signed   By: Nelson Chimes M.D.   On: 09/23/2022 14:30   DG Ankle Complete Left  Result Date: 09/23/2022 CLINICAL DATA:  Fall with pain and deformity EXAM: LEFT ANKLE COMPLETE - 3+ VIEW COMPARISON:  None Available. FINDINGS: Trimalleolar fracture dislocation. Oblique fracture of the distal fibular diaphysis with anterior angulation. Transverse fracture of the medial malleolus. Marked widening of the ankle mortise. Coronal fracture of the posterior tibial lip. Perching of the posterior tibia on the talar dome. IMPRESSION:  Trimalleolar fracture dislocation of the left ankle. Electronically  Signed   By: Nelson Chimes M.D.   On: 09/23/2022 14:29    Results are pending, will review when available.  Assessment and Plan:  * Ankle fracture Patient is presenting with a trimalleolar fracture with dislocation in the setting of a ground-level fall that was mechanical in nature.  - Podiatry consulted; appreciate their recommendations - Plan for the OR tomorrow - N.p.o. at midnight - CT of the left ankle pending - Norco and Dilaudid for pain control - Zofran for nausea - Continuous pulse oximetry while receiving opioid therapy  Falls frequently Per chart review, patient has a history of frequent falls in the setting of polyneuropathy.  Last B12 check approximately 3 months ago with significantly decreased B12 at 156.  This may be contributing to his frequency of falls.  - PT/OT when cleared by podiatry - Vitamin B12 pending  Prediabetes Currently diet controlled only.  Not currently on any medications.  No indication for SSI.  Obstructive sleep apnea syndrome Per chart review, patient does not wear CPAP.  Essential hypertension - Continue home losartan and spironolactone  Benign prostatic hyperplasia without lower urinary tract symptoms - Continue home finasteride, Myrbetriq and Flomax  Advance Care Planning:   Code Status: Full Code verified by patient  Consults: Podiatry  Family Communication: No family at bedside  Severity of Illness: The appropriate patient status for this patient is OBSERVATION. Observation status is judged to be reasonable and necessary in order to provide the required intensity of service to ensure the patient's safety. The patient's presenting symptoms, physical exam findings, and initial radiographic and laboratory data in the context of their medical condition is felt to place them at decreased risk for further clinical deterioration. Furthermore, it is anticipated that the  patient will be medically stable for discharge from the hospital within 2 midnights of admission.   Author: Jose Persia, MD 09/23/2022 3:55 PM  For on call review [email or web address].

## 2022-09-23 NOTE — ED Notes (Signed)
This RN and Karena Addison, RN wasted 4.28mL of Propofol in Pyxis.

## 2022-09-23 NOTE — Assessment & Plan Note (Signed)
Currently diet controlled only.  Not currently on any medications.  No indication for SSI.

## 2022-09-23 NOTE — ED Notes (Signed)
Splint applied to left ankle by EDP Bradler and Les, NT. Pt asleep, vitals WNL.

## 2022-09-23 NOTE — Assessment & Plan Note (Signed)
-   Continue home finasteride, Myrbetriq and Flomax

## 2022-09-23 NOTE — Assessment & Plan Note (Addendum)
Patient is presenting with a trimalleolar fracture with dislocation in the setting of a ground-level fall that was mechanical in nature.  - Podiatry consulted; appreciate their recommendations - Plan for the OR tomorrow - N.p.o. at midnight - CT of the left ankle pending - Norco and Dilaudid for pain control - Zofran for nausea - Continuous pulse oximetry while receiving opioid therapy

## 2022-09-23 NOTE — ED Provider Notes (Signed)
[facility] Provider Note   Event Date/Time   First MD Initiated Contact with Patient 09/23/22 1354     (approximate) History  Fall and Ankle Pain  HPI KORD DEMLOW is a 77 y.o. 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  Patient was unable to bear weight on this ankle since the injury.  Patient denies any head trauma or loss of consciousness.  Patient denies any blood thinner use.  EMS states that they had good DP pulse in this left foot but has not been lost throughout transport.  Patient states he still has good sensation in this left foot and is able to move his toes without difficulty ROS: Patient currently denies any vision changes, tinnitus, difficulty speaking, facial droop, sore throat, chest pain, shortness of breath, abdominal pain, nausea/vomiting/diarrhea, dysuria   Physical Exam  Triage Vital Signs: ED Triage Vitals  Enc Vitals Group     BP      Pulse      Resp      Temp      Temp src      SpO2      Weight      Height      Head Circumference      Peak Flow      Pain Score      Pain Loc      Pain Edu?      Excl. in [place]?    Most recent vital signs: Vitals:   09/23/22 1522 09/23/22 1535  BP: 120/63 107/70  Pulse: 79 66  Resp: 15 (!) 21  Temp:    SpO2: 94% 96%   General: Awake, oriented x4. CV:  Good peripheral perfusion.  Resp:  Normal effort.  Abd:  No distention.  Other:  Elderly overweight Caucasian male laying in bed in no acute distress.  Ankle in hard splint and Curlex gauze with positive DP pulse, intact sensation ED Results / Procedures / Treatments  Labs (all labs ordered are listed, but only abnormal results are displayed) Labs Reviewed  COMPREHENSIVE METABOLIC PANEL - Abnormal; Notable for the following components:      Result Value   Glucose, Bld 129 (*)    All other components within normal limits  CBC WITH  DIFFERENTIAL/PLATELET - Abnormal; Notable for the following components:   Platelets 138 (*)    All other components within normal limits  VITAMIN B12  TROPONIN I (HIGH SENSITIVITY)  TROPONIN I (HIGH SENSITIVITY)   RADIOLOGY ED MD interpretation: X-ray of the left tib-fib and ankle and pendantly interpreted by me shows a proximal tibia and fibula that are nonfracture to nondisplaced however there is a trimalleolar fracture with dislocation of the left ankle -Agree with radiology assessment Official radiology report(s): DG Ankle 2 Views Left  Result Date: 09/23/2022 CLINICAL DATA:  Postreduction. EXAM: LEFT ANKLE - 2 VIEW COMPARISON:  Ankle and lower leg radiographs earlier the same date. FINDINGS: 1525 hours. Interval improved alignment of the previously demonstrated trimalleolar fracture with near anatomic reduction. The fracture of the distal fibular diaphysis is mildly comminuted. There is a possible small fracture fragment between the medial aspect of the tibial plafond and talar dome. No significant residual subluxation of the talus. Associated soft tissue injury. IMPRESSION: Improved alignment of the trimalleolar fracture post reduction as described. Electronically Signed   By: Richardean Sale M.D.   On:  09/23/2022 15:40   DG Tibia/Fibula Left  Result Date: 09/23/2022 CLINICAL DATA:  [place].  Pain and deformity. EXAM: LEFT TIBIA AND FIBULA - 2 VIEW COMPARISON:  Ankle images same day FINDINGS: Proximal tibia and fibula are negative. No evidence of knee injury. See ankle report regarding trimalleolar fracture dislocation. IMPRESSION: Proximal tibia and fibula are negative. See ankle report regarding trimalleolar fracture dislocation. Electronically Signed   By: Nelson Chimes M.D.   On: 09/23/2022 14:30   DG Ankle Complete Left  Result Date: 09/23/2022 CLINICAL DATA:  Fall with pain and deformity EXAM: LEFT ANKLE COMPLETE - 3+ VIEW COMPARISON:  None Available. FINDINGS: Trimalleolar fracture  dislocation. Oblique fracture of the distal fibular diaphysis with anterior angulation. Transverse fracture of the medial malleolus. Marked widening of the ankle mortise. Coronal fracture of the posterior tibial lip. Perching of the posterior tibia on the talar dome. IMPRESSION: Trimalleolar fracture dislocation of the left ankle. Electronically Signed   By: Nelson Chimes M.D.   On: 09/23/2022 14:29   PROCEDURES: Critical Care performed: No .1-3 Lead EKG Interpretation  Performed by: Naaman Plummer, MD Authorized by: Naaman Plummer, MD     Interpretation: normal     ECG rate:  71   ECG rate assessment: normal     Rhythm: sinus rhythm     Ectopy: none     Conduction: normal    MEDICATIONS ORDERED IN ED: Medications  ondansetron (ZOFRAN) injection 4 mg (4 mg Intravenous Given 09/23/22 1404)  sodium chloride flush (NS) 0.9 % injection 3 mL (3 mLs Intravenous Given 09/23/22 1521)  acetaminophen (TYLENOL) tablet 650 mg (has no administration in time range)    Or  acetaminophen (TYLENOL) suppository 650 mg (has no administration in time range)  HYDROcodone-acetaminophen (NORCO/VICODIN) 5-325 MG per tablet 1-2 tablet (has no administration in time range)  HYDROmorphone (DILAUDID) injection 1 mg (has no administration in time range)  propofol (DIPRIVAN) 10 mg/mL bolus/IV push 156.9 mg (156.9 mg Intravenous Given 09/23/22 1512)   IMPRESSION / MDM / ASSESSMENT AND PLAN / ED COURSE  I reviewed the triage vital signs and the nursing notes.                             The patient is on the cardiac monitor to evaluate for evidence of arrhythmia and/or significant heart rate changes. Patient's presentation is most consistent with acute presentation with potential threat to life or bodily function. The Pt was found to have a closed left trimalleolar fracture fracture on XR. The Pt is otherwise well appearing, hemodynamically stable, and shows no evidence of neurovascular injury or compartment syndrome.   I have low suspicion for dislocation, significant ligamentous injury, septic arthritis, gout flare, new autoimmune arthropathy, or gonococcal arthropathy. Patient was placed in a posterior short leg and sugar-tong splint and will be did for the OR with podiatry  Consults: I spoke to Dr. Luana Shu in podiatry who agrees to accept patient for surgery tomorrow. I spoke to Dr. Charleen Kirks on the hospitalist service who agrees to accept this patient onto her service for further management  Dispo: Discharge home with orthopedic follow-up   FINAL CLINICAL IMPRESSION(S) / ED DIAGNOSES   Final diagnoses:  Fall, initial encounter  Closed trimalleolar fracture of left ankle, initial encounter   Rx / DC Orders   ED Discharge Orders     None      Note:  This document was prepared using Dragon  voice recognition software and may include unintentional dictation errors.   Naaman Plummer, MD 09/23/22 1600

## 2022-09-24 ENCOUNTER — Observation Stay: Payer: Medicare Other

## 2022-09-24 ENCOUNTER — Encounter: Payer: Self-pay | Admitting: Internal Medicine

## 2022-09-24 ENCOUNTER — Observation Stay: Payer: Medicare Other | Admitting: Anesthesiology

## 2022-09-24 ENCOUNTER — Other Ambulatory Visit: Payer: Self-pay

## 2022-09-24 ENCOUNTER — Encounter
Admission: EM | Disposition: A | Discharge status: Home Health | Payer: Self-pay | Source: Home / Self Care | Attending: Internal Medicine

## 2022-09-24 DIAGNOSIS — F03918 Unspecified dementia, unspecified severity, with other behavioral disturbance: Secondary | ICD-10-CM | POA: Diagnosis not present

## 2022-09-24 DIAGNOSIS — Z87891 Personal history of nicotine dependence: Secondary | ICD-10-CM | POA: Diagnosis not present

## 2022-09-24 DIAGNOSIS — S82892A Other fracture of left lower leg, initial encounter for closed fracture: Secondary | ICD-10-CM | POA: Diagnosis not present

## 2022-09-24 DIAGNOSIS — G4733 Obstructive sleep apnea (adult) (pediatric): Secondary | ICD-10-CM | POA: Diagnosis present

## 2022-09-24 DIAGNOSIS — Y92017 Garden or yard in single-family (private) house as the place of occurrence of the external cause: Secondary | ICD-10-CM | POA: Diagnosis not present

## 2022-09-24 DIAGNOSIS — Z85828 Personal history of other malignant neoplasm of skin: Secondary | ICD-10-CM | POA: Diagnosis not present

## 2022-09-24 DIAGNOSIS — E876 Hypokalemia: Secondary | ICD-10-CM | POA: Diagnosis present

## 2022-09-24 DIAGNOSIS — N400 Enlarged prostate without lower urinary tract symptoms: Secondary | ICD-10-CM | POA: Diagnosis present

## 2022-09-24 DIAGNOSIS — S93432A Sprain of tibiofibular ligament of left ankle, initial encounter: Secondary | ICD-10-CM | POA: Diagnosis present

## 2022-09-24 DIAGNOSIS — Z96651 Presence of right artificial knee joint: Secondary | ICD-10-CM | POA: Diagnosis present

## 2022-09-24 DIAGNOSIS — Z87442 Personal history of urinary calculi: Secondary | ICD-10-CM | POA: Diagnosis not present

## 2022-09-24 DIAGNOSIS — W010XXA Fall on same level from slipping, tripping and stumbling without subsequent striking against object, initial encounter: Secondary | ICD-10-CM | POA: Diagnosis present

## 2022-09-24 DIAGNOSIS — G629 Polyneuropathy, unspecified: Secondary | ICD-10-CM | POA: Diagnosis present

## 2022-09-24 DIAGNOSIS — R296 Repeated falls: Secondary | ICD-10-CM | POA: Diagnosis present

## 2022-09-24 DIAGNOSIS — M25572 Pain in left ankle and joints of left foot: Secondary | ICD-10-CM | POA: Diagnosis present

## 2022-09-24 DIAGNOSIS — N179 Acute kidney failure, unspecified: Secondary | ICD-10-CM | POA: Diagnosis not present

## 2022-09-24 DIAGNOSIS — S82852A Displaced trimalleolar fracture of left lower leg, initial encounter for closed fracture: Secondary | ICD-10-CM | POA: Diagnosis present

## 2022-09-24 DIAGNOSIS — I10 Essential (primary) hypertension: Secondary | ICD-10-CM | POA: Diagnosis present

## 2022-09-24 DIAGNOSIS — M419 Scoliosis, unspecified: Secondary | ICD-10-CM | POA: Diagnosis present

## 2022-09-24 DIAGNOSIS — F03A11 Unspecified dementia, mild, with agitation: Secondary | ICD-10-CM | POA: Diagnosis present

## 2022-09-24 DIAGNOSIS — Z6836 Body mass index (BMI) 36.0-36.9, adult: Secondary | ICD-10-CM | POA: Diagnosis not present

## 2022-09-24 DIAGNOSIS — Z86711 Personal history of pulmonary embolism: Secondary | ICD-10-CM | POA: Diagnosis not present

## 2022-09-24 DIAGNOSIS — E538 Deficiency of other specified B group vitamins: Secondary | ICD-10-CM | POA: Diagnosis present

## 2022-09-24 DIAGNOSIS — R7303 Prediabetes: Secondary | ICD-10-CM | POA: Diagnosis present

## 2022-09-24 DIAGNOSIS — Z781 Physical restraint status: Secondary | ICD-10-CM | POA: Diagnosis not present

## 2022-09-24 DIAGNOSIS — E669 Obesity, unspecified: Secondary | ICD-10-CM | POA: Diagnosis present

## 2022-09-24 DIAGNOSIS — D696 Thrombocytopenia, unspecified: Secondary | ICD-10-CM | POA: Diagnosis not present

## 2022-09-24 DIAGNOSIS — F05 Delirium due to known physiological condition: Secondary | ICD-10-CM | POA: Diagnosis present

## 2022-09-24 DIAGNOSIS — E785 Hyperlipidemia, unspecified: Secondary | ICD-10-CM | POA: Diagnosis present

## 2022-09-24 HISTORY — PX: ORIF ANKLE FRACTURE: SHX5408

## 2022-09-24 LAB — BASIC METABOLIC PANEL
Anion gap: 7 (ref 5–15)
BUN: 22 mg/dL (ref 8–23)
CO2: 25 mmol/L (ref 22–32)
Calcium: 9.1 mg/dL (ref 8.9–10.3)
Chloride: 106 mmol/L (ref 98–111)
Creatinine, Ser: 1.25 mg/dL — ABNORMAL HIGH (ref 0.61–1.24)
GFR, Estimated: >60 mL/min (ref >60)
Glucose, Bld: 116 mg/dL — ABNORMAL HIGH (ref 70–99)
Potassium: 3.5 mmol/L (ref 3.5–5.1)
Sodium: 138 mmol/L (ref 135–145)

## 2022-09-24 LAB — VITAMIN B12: Vitamin B-12: 343 pg/mL (ref 180–914)

## 2022-09-24 LAB — CBC
HCT: 39.7 % (ref 39.0–52.0)
Hemoglobin: 13.4 g/dL (ref 13.0–17.0)
MCH: 31 pg (ref 26.0–34.0)
MCHC: 33.8 g/dL (ref 30.0–36.0)
MCV: 91.9 fL (ref 80.0–100.0)
Platelets: 128 10*3/uL — ABNORMAL LOW (ref 150–400)
RBC: 4.32 MIL/uL (ref 4.22–5.81)
RDW: 13.7 % (ref 11.5–15.5)
WBC: 7 10*3/uL (ref 4.0–10.5)
nRBC: 0 % (ref 0.0–0.2)

## 2022-09-24 SURGERY — OPEN REDUCTION INTERNAL FIXATION (ORIF) ANKLE FRACTURE
Anesthesia: General | Site: Ankle | Laterality: Left

## 2022-09-24 MED ORDER — ONDANSETRON HCL 4 MG/2ML IJ SOLN
INTRAMUSCULAR | Status: AC
  Filled 2022-09-24: qty 2

## 2022-09-24 MED ORDER — ACETAMINOPHEN 10 MG/ML IV SOLN
INTRAVENOUS | Status: DC | PRN
  Administered 2022-09-24: 1000 mg via INTRAVENOUS

## 2022-09-24 MED ORDER — PROPOFOL 1000 MG/100ML IV EMUL
INTRAVENOUS | Status: AC
  Filled 2022-09-24: qty 100

## 2022-09-24 MED ORDER — HEPARIN SODIUM (PORCINE) 1000 UNIT/ML IJ SOLN
INTRAMUSCULAR | Status: DC | PRN
  Administered 2022-09-24: 5000 [IU]

## 2022-09-24 MED ORDER — SODIUM CHLORIDE FLUSH 0.9 % IV SOLN
INTRAVENOUS | Status: AC
  Filled 2022-09-24: qty 20

## 2022-09-24 MED ORDER — LIDOCAINE HCL (PF) 1 % IJ SOLN
INTRAMUSCULAR | Status: AC
  Filled 2022-09-24: qty 2

## 2022-09-24 MED ORDER — FENTANYL CITRATE (PF) 100 MCG/2ML IJ SOLN
INTRAMUSCULAR | Status: DC | PRN
  Administered 2022-09-24 (×2): 25 ug via INTRAVENOUS

## 2022-09-24 MED ORDER — PROPOFOL 10 MG/ML IV BOLUS
INTRAVENOUS | Status: AC
  Filled 2022-09-24: qty 40

## 2022-09-24 MED ORDER — MIDAZOLAM HCL 2 MG/2ML IJ SOLN
INTRAMUSCULAR | Status: AC
  Filled 2022-09-24: qty 2

## 2022-09-24 MED ORDER — SUCCINYLCHOLINE CHLORIDE 200 MG/10ML IV SOSY
PREFILLED_SYRINGE | INTRAVENOUS | Status: AC
  Filled 2022-09-24: qty 10

## 2022-09-24 MED ORDER — BUPIVACAINE LIPOSOME 1.3 % IJ SUSP
INTRAMUSCULAR | Status: DC | PRN
  Administered 2022-09-24 (×2): 10 mL via PERINEURAL

## 2022-09-24 MED ORDER — SODIUM CHLORIDE (PF) 0.9 % IJ SOLN
INTRAMUSCULAR | Status: AC
  Filled 2022-09-24: qty 10

## 2022-09-24 MED ORDER — BUPIVACAINE HCL (PF) 0.5 % IJ SOLN
INTRAMUSCULAR | Status: AC
  Filled 2022-09-24: qty 10

## 2022-09-24 MED ORDER — LABETALOL HCL 5 MG/ML IV SOLN
INTRAVENOUS | Status: AC
  Filled 2022-09-24: qty 4

## 2022-09-24 MED ORDER — 0.9 % SODIUM CHLORIDE (POUR BTL) OPTIME
TOPICAL | Status: DC | PRN
  Administered 2022-09-24: 750 mL

## 2022-09-24 MED ORDER — ACETAMINOPHEN 10 MG/ML IV SOLN
INTRAVENOUS | Status: AC
  Filled 2022-09-24: qty 100

## 2022-09-24 MED ORDER — BUPIVACAINE LIPOSOME 1.3 % IJ SUSP
INTRAMUSCULAR | Status: AC
  Filled 2022-09-24: qty 20

## 2022-09-24 MED ORDER — HEPARIN SODIUM (PORCINE) 1000 UNIT/ML IJ SOLN
INTRAMUSCULAR | Status: AC
  Filled 2022-09-24: qty 10

## 2022-09-24 MED ORDER — DEXTROSE 5 % IV SOLN
INTRAVENOUS | Status: DC | PRN
  Administered 2022-09-24: 3 g via INTRAVENOUS

## 2022-09-24 MED ORDER — PHENYLEPHRINE 80 MCG/ML (10ML) SYRINGE FOR IV PUSH (FOR BLOOD PRESSURE SUPPORT)
PREFILLED_SYRINGE | INTRAVENOUS | Status: DC | PRN
  Administered 2022-09-24: 160 ug via INTRAVENOUS
  Administered 2022-09-24 (×4): 80 ug via INTRAVENOUS

## 2022-09-24 MED ORDER — FENTANYL CITRATE (PF) 100 MCG/2ML IJ SOLN
INTRAMUSCULAR | Status: AC
  Filled 2022-09-24: qty 2

## 2022-09-24 MED ORDER — LIDOCAINE HCL (PF) 2 % IJ SOLN
INTRAMUSCULAR | Status: AC
  Filled 2022-09-24: qty 5

## 2022-09-24 MED ORDER — LABETALOL HCL 5 MG/ML IV SOLN
INTRAVENOUS | Status: DC | PRN
  Administered 2022-09-24: 2.5 mg via INTRAVENOUS

## 2022-09-24 MED ORDER — ROCURONIUM BROMIDE 10 MG/ML (PF) SYRINGE
PREFILLED_SYRINGE | INTRAVENOUS | Status: AC
  Filled 2022-09-24: qty 10

## 2022-09-24 MED ORDER — FENTANYL CITRATE PF 50 MCG/ML IJ SOSY
PREFILLED_SYRINGE | INTRAMUSCULAR | Status: AC
  Filled 2022-09-24: qty 1

## 2022-09-24 MED ORDER — SODIUM CHLORIDE FLUSH 0.9 % IV SOLN
INTRAVENOUS | Status: AC
  Filled 2022-09-24: qty 10

## 2022-09-24 MED ORDER — BUPIVACAINE HCL (PF) 0.5 % IJ SOLN
INTRAMUSCULAR | Status: DC | PRN
  Administered 2022-09-24 (×2): 10 mL via PERINEURAL

## 2022-09-24 MED ORDER — PHENYLEPHRINE 80 MCG/ML (10ML) SYRINGE FOR IV PUSH (FOR BLOOD PRESSURE SUPPORT)
PREFILLED_SYRINGE | INTRAVENOUS | Status: AC
  Filled 2022-09-24: qty 10

## 2022-09-24 MED ORDER — PROPOFOL 500 MG/50ML IV EMUL
INTRAVENOUS | Status: DC | PRN
  Administered 2022-09-24: 75 ug/kg/min via INTRAVENOUS

## 2022-09-24 MED ORDER — LORAZEPAM 2 MG/ML IJ SOLN: 1.0000 mg | Freq: Once | INTRAMUSCULAR | Status: AC

## 2022-09-24 MED ORDER — VITAMIN B-12 1000 MCG PO TABS
1000.0000 ug | ORAL_TABLET | Freq: Every day | ORAL | Status: DC
  Administered 2022-09-26 – 2022-09-27 (×2): 1000 ug via ORAL
  Filled 2022-09-24 (×3): qty 1

## 2022-09-24 MED ORDER — DEXAMETHASONE SODIUM PHOSPHATE 10 MG/ML IJ SOLN
INTRAMUSCULAR | Status: AC
  Filled 2022-09-24: qty 1

## 2022-09-24 MED ORDER — HALOPERIDOL LACTATE 5 MG/ML IJ SOLN
5.0000 mg | Freq: Four times a day (QID) | INTRAMUSCULAR | Status: DC | PRN
  Administered 2022-09-24 – 2022-09-26 (×4): 5 mg via INTRAMUSCULAR
  Filled 2022-09-24 (×4): qty 1

## 2022-09-24 MED ORDER — LIDOCAINE HCL (CARDIAC) PF 100 MG/5ML IV SOSY
PREFILLED_SYRINGE | INTRAVENOUS | Status: DC | PRN
  Administered 2022-09-24: 50 mg via INTRAVENOUS

## 2022-09-24 MED ORDER — ONDANSETRON HCL 4 MG/2ML IJ SOLN
INTRAMUSCULAR | Status: DC | PRN
  Administered 2022-09-24: 4 mg via INTRAVENOUS

## 2022-09-24 MED ORDER — LACTATED RINGERS IV SOLN: INTRAVENOUS | Status: DC | PRN

## 2022-09-24 MED ORDER — LIDOCAINE HCL (PF) 1 % IJ SOLN
INTRAMUSCULAR | Status: DC | PRN
  Administered 2022-09-24 (×2): 1 mL via SUBCUTANEOUS

## 2022-09-24 MED ORDER — LORAZEPAM 2 MG/ML IJ SOLN
INTRAMUSCULAR | Status: AC
  Administered 2022-09-24: 1 mg via INTRAMUSCULAR
  Filled 2022-09-24: qty 1

## 2022-09-24 MED ORDER — PROPOFOL 10 MG/ML IV BOLUS
INTRAVENOUS | Status: DC | PRN
  Administered 2022-09-24: 10 mg via INTRAVENOUS
  Administered 2022-09-24: 20 mg via INTRAVENOUS

## 2022-09-24 SURGICAL SUPPLY — 73 items
BIT DRILL 2.0X130 SLD AO (BIT) IMPLANT
BIT DRILL CANN LONG 4.6X220 (DRILL) IMPLANT
BIT DRILL CANNULTD 2.6 X 130MM (DRILL) IMPLANT
BLADE SURG 15 STRL LF DISP TIS (BLADE) IMPLANT
BLADE SURG 15 STRL SS (BLADE)
BNDG COHESIVE 4X5 TAN STRL LF (GAUZE/BANDAGES/DRESSINGS) ×1 IMPLANT
BNDG ELASTIC 4X5.8 VLCR NS LF (GAUZE/BANDAGES/DRESSINGS) ×1 IMPLANT
BNDG ESMARCH 4 X 12 STRL LF (GAUZE/BANDAGES/DRESSINGS) ×1
BNDG ESMARCH 4X12 STRL LF (GAUZE/BANDAGES/DRESSINGS) ×1 IMPLANT
BNDG GAUZE DERMACEA FLUFF 4 (GAUZE/BANDAGES/DRESSINGS) ×1 IMPLANT
BNDG STRETCH GAUZE 3IN X12FT (GAUZE/BANDAGES/DRESSINGS) ×1 IMPLANT
CUFF TOURN SGL QUICK 18X4 (TOURNIQUET CUFF) IMPLANT
CUFF TOURN SGL QUICK 24 (TOURNIQUET CUFF)
CUFF TRNQT CYL 24X4X16.5-23 (TOURNIQUET CUFF) IMPLANT
DRAPE C-ARM XRAY 36X54 (DRAPES) ×1 IMPLANT
DRAPE C-ARMOR (DRAPES) ×1 IMPLANT
DRILL CANN LONG 4.6X220 (DRILL) ×1
DRILL CANNULATED 2.6 X 130MM (DRILL) ×1
DURAPREP 26ML APPLICATOR (WOUND CARE) ×1 IMPLANT
ELECT REM PT RETURN 9FT ADLT (ELECTROSURGICAL) ×1
ELECTRODE REM PT RTRN 9FT ADLT (ELECTROSURGICAL) ×1 IMPLANT
GAUZE SPONGE 4X4 12PLY STRL (GAUZE/BANDAGES/DRESSINGS) ×1 IMPLANT
GAUZE STRETCH 2X75IN STRL (MISCELLANEOUS) ×1 IMPLANT
GAUZE XEROFORM 1X8 LF (GAUZE/BANDAGES/DRESSINGS) ×1 IMPLANT
GLOVE BIO SURGEON STRL SZ7 (GLOVE) ×1 IMPLANT
GLOVE INDICATOR 7.0 STRL GRN (GLOVE) ×1 IMPLANT
GOWN STRL REUS W/ TWL LRG LVL3 (GOWN DISPOSABLE) ×3 IMPLANT
GOWN STRL REUS W/TWL LRG LVL3 (GOWN DISPOSABLE) ×3
K-WIRE SNGL END 1.2X150 (MISCELLANEOUS) ×2
KIT BONE MRW ASP ANGEL CPRP (KITS) IMPLANT
KIT TURNOVER KIT A (KITS) ×1 IMPLANT
KWIRE SNGL END 1.2X150 (MISCELLANEOUS) IMPLANT
LABEL OR SOLS (LABEL) ×1 IMPLANT
MANIFOLD NEPTUNE II (INSTRUMENTS) ×1 IMPLANT
NDL HYPO 22X1.5 SAFETY MO (MISCELLANEOUS) ×1 IMPLANT
NEEDLE HYPO 22X1.5 SAFETY MO (MISCELLANEOUS) ×1 IMPLANT
NS IRRIG 1000ML POUR BTL (IV SOLUTION) IMPLANT
NS IRRIG 500ML POUR BTL (IV SOLUTION) ×1 IMPLANT
PACK EXTREMITY ARMC (MISCELLANEOUS) ×1 IMPLANT
PAD PREP 24X41 OB/GYN DISP (PERSONAL CARE ITEMS) ×1 IMPLANT
PLATE FIB GORILLA 13H LT (Plate) IMPLANT
PUTTY DBM ALLOSYNC PURE 5CC (Putty) IMPLANT
SCREW 4.0 X 40 LT (Screw) IMPLANT
SCREW LOCK PLATE R3 2.7X12 (Screw) IMPLANT
SCREW LOCK PLATE R3 2.7X13 (Screw) IMPLANT
SCREW LOCK PLATE R3 2.7X14 (Screw) IMPLANT
SCREW LOCK PLATE R3 2.7X15 (Screw) IMPLANT
SCREW LOCK PLATE R3 2.7X16 (Screw) IMPLANT
SCREW LOCK PLATE R3 3.5X16 (Screw) IMPLANT
SCREW NONLOCK PLATE 3.5X58 (Screw) IMPLANT
SCREW NONLOCK PLATE 3.5X65 (Screw) IMPLANT
SPLINT CAST 1 STEP 4X30 (MISCELLANEOUS) ×1 IMPLANT
SPLINT PLASTER CAST FAST 5X30 (CAST SUPPLIES) ×1 IMPLANT
SPONGE T-LAP 18X18 ~~LOC~~+RFID (SPONGE) ×1 IMPLANT
STAPLER SKIN PROX 35W (STAPLE) ×1 IMPLANT
STIMULATOR BONE (ORTHOPEDIC SUPPLIES) ×1
STIMULATOR BONE GROWTH EMG EXT (ORTHOPEDIC SUPPLIES) IMPLANT
STOCKINETTE M/LG 89821 (MISCELLANEOUS) ×1 IMPLANT
STRAP SAFETY 5IN WIDE (MISCELLANEOUS) ×1 IMPLANT
STRIP CLOSURE SKIN 1/2X4 (GAUZE/BANDAGES/DRESSINGS) IMPLANT
SUT VIC AB 2-0 CT1 27 (SUTURE)
SUT VIC AB 2-0 CT1 TAPERPNT 27 (SUTURE) ×1 IMPLANT
SUT VIC AB 2-0 SH 27 (SUTURE) ×1
SUT VIC AB 2-0 SH 27XBRD (SUTURE) IMPLANT
SUT VIC AB 3-0 SH 27 (SUTURE) ×1
SUT VIC AB 3-0 SH 27X BRD (SUTURE) ×1 IMPLANT
SWABSTK COMLB BENZOIN TINCTURE (MISCELLANEOUS) IMPLANT
SYR 10ML LL (SYRINGE) ×1 IMPLANT
SYR 50ML LL SCALE MARK (SYRINGE) ×1 IMPLANT
TRAP FLUID SMOKE EVACUATOR (MISCELLANEOUS) ×1 IMPLANT
WATER STERILE IRR 500ML POUR (IV SOLUTION) ×1 IMPLANT
WIRE OLIVE SMOOTH 1.4MMX60MM (WIRE) IMPLANT
intraosseous bioplasty core decompression and delivery system IMPLANT

## 2022-09-24 NOTE — Progress Notes (Addendum)
Progress Note    Thomas Mcdaniel  [id number] DOB: [birth year]  DOA: 09/23/2022 PCP: Valera Castle, MD      Brief Narrative:    Medical records reviewed and are as summarized below:  Thomas Mcdaniel is a 77 y.o. male medical history significant for hypertension, overactive bladder, vitamin B12 deficiency, peripheral neuropathy, frequent falls, who presented to the emergency department after a ground-level fall.  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       Assessment/Plan:   Principal Problem:   Ankle fracture Active Problems:   Falls frequently   Benign prostatic hyperplasia without lower urinary tract symptoms   Essential hypertension   Obstructive sleep apnea syndrome   Prediabetes    Body mass index is 36.14 kg/m.  (Obesity)   Left trimalleolar left ankle fracture with dislocation s/p mechanical fal with history of frequent falls l: Analgesics as needed for pain.  Plan for surgical repair of left foot fracture today.  Follow-up with podiatrist.  PT and OT evaluation.  Acute confusional state, delirium with agitation: Patient was placed in soft wrist restraints.  Haldol as needed for agitation.   Vitamin B12 deficiency: Vitamin B12 was 156 on 06/07/2022.  Vitamin B12 improved to 343 on his admission.  Start vitamin B12 supplement   Thrombocytopenia: Monitor CBC.  This may be chronic.  Platelet count was  143,000 in June 2023   The comorbidities include prediabetes, OSA (not on CPAP), hypertension, BPH   Diet Order             Diet NPO time specified  Diet effective now                            Consultants: Podiatrist  Procedures: Plan for left foot surgery today    Medications:    bupivacaine liposome       bupivacaine(PF)       bupivacaine(PF)       chlorhexidine  60 mL Topical Once   [MAR Hold] finasteride  5 mg Oral Daily   lidocaine (PF)       [MAR Hold]  losartan  100 mg Oral QHS   [MAR Hold] memantine  5 mg Oral BID   [MAR Hold] mirabegron ER  50 mg Oral Daily   [MAR Hold] mirtazapine  15 mg Oral QHS   povidone-iodine  2 Application Topical Once   [MAR Hold] rosuvastatin  5 mg Oral Daily   [MAR Hold] sodium chloride flush  3 mL Intravenous Q12H   [MAR Hold] spironolactone  25 mg Oral QHS   [MAR Hold] tamsulosin  0.4 mg Oral Daily   Continuous Infusions:   ceFAZolin (ANCEF) IV       Anti-infectives (From admission, onward)    Start     Dose/Rate Route Frequency Ordered Stop   09/24/22 0600  ceFAZolin (ANCEF) IVPB 3g/100 mL premix        3 g 200 mL/hr over 30 Minutes Intravenous On call to O.R. 09/23/22 1621 09/25/22 0559              Family Communication/Anticipated D/C date and plan/Code Status   DVT prophylaxis: SCDs Start: 09/23/22 1504     Code Status: Full Code  Family Communication: None Disposition Plan: Plan to discharge home in 2 to 3 days   Status is: Observation The patient will require care spanning > 2  midnights and should be moved to inpatient because: Left ankle fracture       Subjective:   Interval events noted.  He complains of pain in the left foot.  Objective:    Vitals:   09/24/22 1204 09/24/22 1229 09/24/22 1232 09/24/22 1246  BP: (!) 153/77 (!) 131/90 129/74 116/77  Pulse: 100  95   Resp: 20     Temp: 98.2 F (36.8 C)     TempSrc: Temporal     SpO2: 92%  95% 94%  Weight:   111 kg   Height:   5\' 9"  (1.753 m)    No data found.   Intake/Output Summary (Last 24 hours) at 09/24/2022 1253 Last data filed at 09/24/2022 0300 Gross per 24 hour  Intake 0 ml  Output 200 ml  Net -200 ml   Filed Weights   09/23/22 1408 09/24/22 1232  Weight: (!) 156.9 kg 111 kg    Exam:  GEN: NAD SKIN: Warm and dry EYES: No pallor or icterus ENT: MMM CV: RRR PULM: CTA B ABD: soft, obese, NT, +BS CNS: AAO x 3, non focal EXT: Splint on left leg and left foot.  Peripheral IV on right  forearm was leaking.        Data Reviewed:   I have personally reviewed following labs and imaging studies:  Labs: Labs show the following:   Basic Metabolic Panel: Recent Labs  Lab 09/23/22 1407 09/24/22 0637  NA 137 138  K 3.9 3.5  CL 107 106  CO2 23 25  GLUCOSE 129* 116*  BUN 22 22  CREATININE 1.08 1.25*  CALCIUM 9.4 9.1   GFR Estimated Creatinine Clearance: 62.7 mL/min (A) (by C-G formula based on SCr of 1.25 mg/dL (H)). Liver Function Tests: Recent Labs  Lab 09/23/22 1407  AST 35  ALT 24  ALKPHOS 54  BILITOT 0.8  PROT 7.0  ALBUMIN 4.0   No results for input(s): "LIPASE", "AMYLASE" in the last 168 hours. No results for input(s): "AMMONIA" in the last 168 hours. Coagulation profile No results for input(s): "INR", "PROTIME" in the last 168 hours.  CBC: Recent Labs  Lab 09/23/22 1407 09/24/22 0637  WBC 7.5 7.0  NEUTROABS 5.1  --   HGB 14.3 13.4  HCT 42.0 39.7  MCV 90.9 91.9  PLT 138* 128*   Cardiac Enzymes: No results for input(s): "CKTOTAL", "CKMB", "CKMBINDEX", "TROPONINI" in the last 168 hours. BNP (last 3 results) No results for input(s): "PROBNP" in the last 8760 hours. CBG: No results for input(s): "GLUCAP" in the last 168 hours. D-Dimer: No results for input(s): "DDIMER" in the last 72 hours. Hgb A1c: No results for input(s): "HGBA1C" in the last 72 hours. Lipid Profile: No results for input(s): "CHOL", "HDL", "LDLCALC", "TRIG", "CHOLHDL", "LDLDIRECT" in the last 72 hours. Thyroid function studies: No results for input(s): "TSH", "T4TOTAL", "T3FREE", "THYROIDAB" in the last 72 hours.  Invalid input(s): "FREET3" Anemia work up: Recent Labs    09/23/22 1706  VITAMINB12 343   Sepsis Labs: Recent Labs  Lab 09/23/22 1407 09/24/22 0637  WBC 7.5 7.0    Microbiology Recent Results (from the past 240 hour(s))  Microscopic Examination     Status: None   Collection Time: 09/20/22  9:23 AM   Urine  Result Value Ref Range Status    WBC, UA 0-5 0 - 5 /hpf Final   RBC, Urine 0-2 0 - 2 /hpf Final   Epithelial Cells (non renal) 0-10 0 - 10 /hpf Final  Bacteria, UA Few None seen/Few Final    Procedures and diagnostic studies:  [place] OR NERVE BLOCK-IMAGE ONLY [facility])  Result Date: 09/24/2022 There is no interpretation for this exam.  This order is for images obtained during a surgical procedure.  Please See "Surgeries" Tab for more information regarding the procedure.   [place] OR NERVE BLOCK-IMAGE ONLY [facility])  Result Date: 09/24/2022 There is no interpretation for this exam.  This order is for images obtained during a surgical procedure.  Please See "Surgeries" Tab for more information regarding the procedure.   CT Ankle Left Wo Contrast  Result Date: 09/23/2022 CLINICAL DATA:  Trimalleolar ankle fracture post reduction. EXAM: CT OF THE LEFT ANKLE WITHOUT CONTRAST TECHNIQUE: Multidetector CT imaging of the left ankle was performed according to the standard protocol. Multiplanar CT image reconstructions were also generated. RADIATION DOSE REDUCTION: This exam was performed according to the departmental dose-optimization program which includes automated exposure control, adjustment of the mA and/or kV according to patient size and/or use of iterative reconstruction technique. COMPARISON:  Radiographs same day. FINDINGS: Bones/Joint/Cartilage The ankle is splinted. There is near anatomic reduction of the comminuted fracture of the distal fibular diaphysis, centered approximately 5 cm proximal to the tibial plafond. The distal fibula is intact. There is near anatomic reduction of the oblique fracture through the base of the medial malleolus. The fracture of the posterior malleolus also demonstrates near anatomic reduction. Intra-articular avulsion fracture involving the anterolateral aspect of the distal tibia mediated by the anterior inferior tibiofibular ligament demonstrates 10 mm of lateral displacement (image 142/5). No residual  subluxation of the talar dome. There is a small ankle joint effusion with probable small intra-articular fracture fragments both anteriorly and posteriorly. The talar dome appears intact. There is suspicion of a nondisplaced intra-articular fracture involving the base of the 3rd metatarsal, best seen on the axial images. No apparent significant subluxation at the Lisfranc joint. The additional tarsal bones appear intact. Ligaments Suboptimally assessed by CT. As above, probable avulsion fracture of the anterolateral distal tibia by the anterior inferior tibiofibular ligament. Muscles and Tendons Diffuse fatty atrophy of the visualized distal gastrocnemius musculature. There are calcifications associated with the peroneal tendons. The ankle tendons appear intact without entrapment in the fractures. There are prominent calcifications within the plantar fascia which appear chronic. Soft tissues Soft tissue swelling in the lower leg without foreign body or soft tissue emphysema. No significant focal hematoma. IMPRESSION: 1. Near anatomic reduction of the trimalleolar fracture status post reduction and splinting. 2. Intra-articular avulsion fracture of the anterolateral aspect of the distal tibia mediated by the anterior inferior tibiofibular ligament. 3. Suspected nondisplaced intra-articular fracture involving the base of the 3rd metatarsal. 4. No residual subluxation of the talar dome. Small joint effusion with probable small intra-articular fracture fragments. 5. Diffuse fatty atrophy of the visualized distal gastrocnemius musculature. Electronically Signed   By: Richardean Sale M.D.   On: 09/23/2022 16:17   DG Ankle 2 Views Left  Result Date: 09/23/2022 CLINICAL DATA:  Postreduction. EXAM: LEFT ANKLE - 2 VIEW COMPARISON:  Ankle and lower leg radiographs earlier the same date. FINDINGS: 1525 hours. Interval improved alignment of the previously demonstrated trimalleolar fracture with near anatomic reduction. The  fracture of the distal fibular diaphysis is mildly comminuted. There is a possible small fracture fragment between the medial aspect of the tibial plafond and talar dome. No significant residual subluxation of the talus. Associated soft tissue injury. IMPRESSION: Improved alignment of the trimalleolar fracture post reduction as  described. Electronically Signed   By: Richardean Sale M.D.   On: 09/23/2022 15:40   DG Tibia/Fibula Left  Result Date: 09/23/2022 CLINICAL DATA:  [place].  Pain and deformity. EXAM: LEFT TIBIA AND FIBULA - 2 VIEW COMPARISON:  Ankle images same day FINDINGS: Proximal tibia and fibula are negative. No evidence of knee injury. See ankle report regarding trimalleolar fracture dislocation. IMPRESSION: Proximal tibia and fibula are negative. See ankle report regarding trimalleolar fracture dislocation. Electronically Signed   By: Nelson Chimes M.D.   On: 09/23/2022 14:30   DG Ankle Complete Left  Result Date: 09/23/2022 CLINICAL DATA:  Fall with pain and deformity EXAM: LEFT ANKLE COMPLETE - 3+ VIEW COMPARISON:  None Available. FINDINGS: Trimalleolar fracture dislocation. Oblique fracture of the distal fibular diaphysis with anterior angulation. Transverse fracture of the medial malleolus. Marked widening of the ankle mortise. Coronal fracture of the posterior tibial lip. Perching of the posterior tibia on the talar dome. IMPRESSION: Trimalleolar fracture dislocation of the left ankle. Electronically Signed   By: Nelson Chimes M.D.   On: 09/23/2022 14:29               LOS: 0 days   Nanako Stopher  [facility]   Pager on [email or web address]. If 7PM-7AM, please contact night-coverage at [email or web address]     09/24/2022, 12:53 PM

## 2022-09-24 NOTE — Anesthesia Postprocedure Evaluation (Signed)
Anesthesia Post Note  Patient: Thomas Mcdaniel  Procedure(s) Performed: OPEN REDUCTION INTERNAL FIXATION (ORIF) ANKLE FRACTURE, SYNDESMOTIC FIXATION, TALUS RETROGRADE FILLING CYSTIC CHANGES WITHIN THE TALUS WITH BONE GRAFT (Left: Ankle)  Patient location during evaluation: PACU Anesthesia Type: General Level of consciousness: awake and alert Pain management: pain level controlled Vital Signs Assessment: post-procedure vital signs reviewed and stable Respiratory status: spontaneous breathing, nonlabored ventilation, respiratory function stable and patient connected to nasal cannula oxygen Cardiovascular status: blood pressure returned to baseline and stable Postop Assessment: no apparent nausea or vomiting Anesthetic complications: no   No notable events documented.   Last Vitals:  Vitals:   09/24/22 1615 09/24/22 1630  BP: (!) 123/92 109/75  Pulse: 94 85  Resp: 14 11  Temp:  (!) 36.2 C  SpO2: 93% 97%    Last Pain:  Vitals:   09/24/22 1630  TempSrc:   PainSc: 0-No pain                 Precious Haws Delonda Coley

## 2022-09-24 NOTE — Anesthesia Procedure Notes (Signed)
Anesthesia Regional Block: Popliteal block   Pre-Anesthetic Checklist: , timeout performed,  Correct Patient, Correct Site, Correct Laterality,  Correct Procedure, Correct Position, site marked,  Risks and benefits discussed,  Surgical consent,  Pre-op evaluation,  At surgeon's request and post-op pain management  Laterality: Left  Prep: chloraprep       Needles:  Injection technique: Single-shot  Needle Type: Stimiplex     Needle Length: 9cm  Needle Gauge: 22     Additional Needles:   Procedures:,,,, ultrasound used (permanent image in chart),,    Narrative:  Start time: 09/24/2022 12:29 PM End time: 09/24/2022 12:33 PM Injection made incrementally with aspirations every 5 mL.  Performed by: Personally  Anesthesiologist: Iran Ouch, MD  Additional Notes: Patient consented for risk and benefits of nerve block including but not limited to nerve damage, failed block, bleeding and infection.  Patient voiced understanding.  Functioning IV was confirmed and monitors were applied.  Timeout done prior to procedure and prior to any sedation being given to the patient.  Patient confirmed procedure site prior to any sedation given to the patient. Sterile prep,hand hygiene and sterile gloves were used.  Minimal sedation used for procedure.  No paresthesia endorsed by patient during the procedure.  Negative aspiration and negative test dose prior to incremental administration of local anesthetic. The patient tolerated the procedure well with no immediate complications.

## 2022-09-24 NOTE — Plan of Care (Signed)

## 2022-09-24 NOTE — Op Note (Signed)
PODIATRY / FOOT AND ANKLE SURGERY OPERATIVE REPORT    SURGEON: Caroline More, DPM  PRE-OPERATIVE DIAGNOSIS:  1.  Left trimalleolar ankle fracture, displaced, closed 2.  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  Disruption of left ankle syndesmosis 4.  Neuropathy  POST-OPERATIVE DIAGNOSIS: Same  PROCEDURE(S): Left trimalleolar ankle fracture open reduction with internal fixation Left syndesmotic fixation ankle Left talus retrograde filling cystic changes within the talus with bone graft  HEMOSTASIS: Left thigh tourniquet  ANESTHESIA: MAC  ESTIMATED BLOOD LOSS: 30 cc  FINDING(S): 1.  Comminuted left fibular fracture with butterfly fragment 2.  Disruption of left syndesmosis ankle ligament 3.  Displaced medial malleolar fracture left 4.  Cystic changes within the left talus, no evidence of osteochondral defect  PATHOLOGY/SPECIMEN(S): None  INDICATIONS:   Thomas Mcdaniel is a 77 y.o. 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  Patient tripped and felt a pop in his ankle.  Patient was taken to the hospital due to inability to bear weight.  Patient on x-ray imaging taken which showed a left ankle trimalleolar closed fracture.  Patient had closed reduction performed and was placed into a posterior splint and was not noted to have any blistering and pulses and skin appeared to be intact and viable according to the emergency room.  All treatment options were discussed with the patient and patient's family both conservative and surgical attempts at correction including potential risks and complications at this time patient and family have elected for surgical intervention consisting of left ankle trimalleolar fracture open reduction with internal fixation with syndesmotic repair and retrograde drilling with bone graft placement within the talus for cystic changes that were present on CT scan.  No  guarantees given.  Consent obtained prior to procedure.  Prior to procedure patient had alteration in mental status due to Dilaudid that was given so family signed consent.  Discussed same risks and complications with them and they were agreeable to procedure as well.  DESCRIPTION: After obtaining full informed written consent, the patient was brought back to the operating room and placed supine upon the operating table.  The patient received IV antibiotics prior to induction.  A popliteal and saphenous nerve block was performed by anesthesia preoperatively.  After obtaining adequate anesthesia, the patient was prepped and draped in the standard fashion.  Attention was directed to the left heel where a small stab incision was made over the area.  At this time an obturator and cannula was then placed into the heel at the calcaneus at the body and driven into the calcaneal body.  The obturator was removed and the cannula was left intact.  Approximately 30 cc of bone marrow aspirate was obtained from the calcaneus and passed off the operative site.  Attention was then directed to the left lateral ankle where x-ray imaging was used with C arm to guide incision placement over the lateral malleolus and distal fibula at the area of the fracture as well as medial malleolus.  The incision was marked.  An Esmarch bandage was used to exsanguinate the left lower extremity and the pneumatic thigh tourniquet was inflated.  Attention was directed to the left lateral ankle where a linear longitudinal incision was made over the distal fibula and fibular shaft.  The incision was deepened to the subcutaneous tissues utilizing sharp blunt dissection and care was taken to identify and retract all vital neurovascular structures and  all venous contributories were cauterized necessary.  The superficial peroneal nerve was identified and retracted anteriorly throughout the remainder of the case in this area.  A periosteal incision  was made into the fibula and the periosteum was reflected anteriorly and posteriorly thereby exposing the fibula at the operative site.  There appeared to be a comminuted slightly displaced fracture of the fibula with a large butterfly fragment posteriorly as well as multiple small bony fragments that were present to the area.  The fibula appeared to be slightly shortened and also posteriorly rotated.  At this time the fracture was mobilized and the distal portion was distracted and the fracture was held in a reduced position with a reduction clamp.  No lag screws could be placed across this area due to the severe comminuted nature.  This was checked under fluoroscopic guidance and the reduction appeared to be excellent overall.  At this time a Paragon 28 13 hole distal fibular locking plate was spanned over the area and placed in such a way that it avoided the reduction clamps.  The plate was held in the appropriate position and all of wires were placed in the plate to hold the plate temporarily and in place.  It was slightly difficult to fit the plate on proximally at the distal fibula shaft.  Due to the triangular type nature of the lateral cortex of the distal fibular shaft.  This was accommodated for by bending the plate and into the appropriate position prior to placing onto the foot.  At this time 3 2.7 mm locking screws were placed proximal to the fracture line and one 3.5 mm locking screw was placed as well proximal to the fracture line.  Four 2.7 mm locking screws were placed into the distal fibula and lateral malleolus area distal to the fracture site.  At this time a reduction clamp was then used to reduce the syndesmosis in the appropriate orientation.  Once it appeared to be reduced to 3.5 mm nonlocking syndesmotic screws were placed across the fibula and tibia with the appropriate orientation.  This appeared to stabilize the syndesmosis very well.  The syndesmosis was stressed further and no  instability was noted.  The posterior malleolar fracture fragment appeared to be in a near anatomic position at this time and appeared to be very small so no fixation was deemed to be needed to this area.  Attention was then directed to the medial malleolus.  An incision was made over the medial malleolus.  The incision was deepened through the subcutaneous tissues utilizing sharp and blunt dissection and care was taken to identify and retract all vital neural and vascular structures and all venous contributories were cauterized as necessary.  At this time the medial malleolar fracture fragment was able to be visualized.  Some periosteum appeared to be within the fracture site itself which was resected and passed off the operative site.  Some of the periosteum was reflected off of the fracture site.  To better visualize the fracture reduction.  The ankle joint was also inspected and then the hematoma type tissue was removed.  No osteochondral defect was present to the area.  Attention was then directed to the sinus tarsi where a small stab incision was made over this area.  At this time an obturator and cannula was then drilled through the talus from plantar lateral to dorsal medial into the area of the previously seen subchondral bone cyst.  Once in the appropriate orientation on the AP and  lateral views under fluoroscopic guidance the obturator was removed and the cannula was left intact.  Then utilizing [facility] set with demineralized bone matrix, the cannula was filled with 2 cc of this type material and insufflated within the talus itself filling the cystic changes.  This was visualized under fluoroscopic guidance and the cyst appeared to fill pretty well with the bone graft/BMAC solution.  The cannula was removed.  Attention was then directed back to the medial ankle where the ankle joint was visualized again looking through the fracture area.  There did not appear to be any bone graft within the  ankle joint itself indicating that was just in the talus and did not come out into the ankle joint.  The medial malleolar fracture fragment was then held into a reduced position and 2 guidewires for Paragon 28 cannulated screws were placed across with the appropriate orientation perpendicular to the fracture and across the fracture into the distal tibia.  Once in the appropriate orientation on AP and lateral views utilizing standard AO principles and techniques to 3.5 x 40 mm partially-threaded cannulated Paragon 28 screws were placed across the fracture site with excellent compression.  The fracture appeared to be reduced in near anatomic alignment both clinically and radiographically.  The ankle joint was then stressed further with valgus and varus stress testing as well as syndesmotic stressing which did not appear to have any gapping or instability present.  The posterior malleolar fracture fragment appeared to be well reduced at this time and did not appear to be displaced.  The surgical sites were flushed with copious amounts normal sterile saline.  The periosteal and capsular structures were reapproximated well coapted with 3-0 Vicryl on both the medial and lateral incisions that were made.  The subcutaneous tissues were reapproximated well coapted with 3-0 Vicryl.  The skin at all incision sites was then reapproximated well coapted with skin staples.  During closure the pneumatic thigh tourniquet was deflated and a prompt hyperemic response was noted to all digits left foot.  Hemostasis appeared to be well achieved overall.  A postoperative dressing was then applied consisting of Xeroform to the incisional areas followed by 4 x 4 gauze, ABD, Kerlix, Webril, posterior splint, Ace wrap.  The patient tolerated the procedure and anesthesia well was transferred to recovery room with vital signs stable and vascular status intact to all toes left foot.  A bone stimulator was also applied from [employer]agon 28 and  family was instructed on how to use this daily.  Patient is to remain nonweightbearing.  PT/OT orders placed to begin tomorrow.  May need skilled rehab.  COMPLICATIONS: None  CONDITION: Good, stable  Caroline More, DPM

## 2022-09-24 NOTE — Anesthesia Procedure Notes (Signed)
Anesthesia Regional Block: Adductor canal block   Pre-Anesthetic Checklist: , timeout performed,  Correct Patient, Correct Site, Correct Laterality,  Correct Procedure, Correct Position, site marked,  Risks and benefits discussed,  Surgical consent,  Pre-op evaluation,  At surgeon's request and post-op pain management  Laterality: Left  Prep: chloraprep       Needles:  Injection technique: Single-shot  Needle Type: Stimiplex     Needle Length: 9cm  Needle Gauge: 22     Additional Needles:   Procedures:,,,, ultrasound used (permanent image in chart),,    Narrative:  Start time: 09/24/2022 12:27 PM End time: 09/24/2022 11:28 AM Injection made incrementally with aspirations every 5 mL.  Performed by: Personally  Anesthesiologist: Iran Ouch, MD  Additional Notes: Patient consented for risk and benefits of nerve block including but not limited to nerve damage, failed block, bleeding and infection.  Patient voiced understanding.  Functioning IV was confirmed and monitors were applied.  Timeout done prior to procedure and prior to any sedation being given to the patient.  Patient confirmed procedure site prior to any sedation given to the patient. Sterile prep,hand hygiene and sterile gloves were used.  Minimal sedation used for procedure.  No paresthesia endorsed by patient during the procedure.  Negative aspiration and negative test dose prior to incremental administration of local anesthetic. The patient tolerated the procedure well with no immediate complications.

## 2022-09-24 NOTE — Progress Notes (Signed)
Upon shift start, patient was very combative. 2 sons in room trying to calm patient but he is confused and not able to be redirected. Began cursing at staff. Lost IV access and day nurse notified MD because he will not keep an IV in. NP notified by charge nurse and an order for ativan was given. Administered dose. Son will stay overnight with patient. Night po meds not given due to combativeness and in an effort to decrease stimulation until cognitive status improves.
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
## 2022-09-24 NOTE — Consult Note (Signed)
PODIATRY / FOOT AND ANKLE SURGERY CONSULTATION NOTE  Requesting Physician: Dr. Leory Plowman  Reason for consult: Left ankle fracture  Chief Complaint: Left ankle pain   HPI: Thomas Mcdaniel is a 77 y.o. 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  Patient came to [facility] and was diagnosed with a displaced trimalleolar ankle fracture.  Patient had conscious sedation and reduction performed to the ankle and was placed into a posterior splint around 3 to 4 PM in the afternoon.  Podiatry team and orthopedics was consulted for possible open reduction/internal fixation.  Patient presents today laying in bed comfortably.  Patient does have some mild pain to the left ankle.  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  He also has a previous third toe amputation that was performed by Dr. Cleda Mccreedy in the past and does have history of bilateral foot drop.  PMHx:  Past Medical History:  Diagnosis Date   Acquired spondylolisthesis    BPH (benign prostatic hyperplasia)    Bradycardia    Burn    Bursitis    right elbow    Cancer    skin cancer   Complication of anesthesia    difficult to wake up after a lithotripsy   DJD (degenerative joint disease)    Foot drop    bil.   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    Nephrolithiasis    Obesity    Peripheral polyneuropathy    Personal history of PE (pulmonary embolism)    PONV (postoperative nausea and vomiting)    after cataract surgery   Pre-diabetes    Scoliosis     Sleep apnea    pt denies   SOB (shortness of breath)    Spinal stenosis, lumbar region without neurogenic claudication    Urine frequency    UTI (urinary tract infection) during pregnancy, unspecified trimester     Surgical Hx:  Past Surgical History:  Procedure Laterality Date   CATARACT EXTRACTION     CATARACT EXTRACTION W/PHACO Right 08/25/2019   Procedure: CATARACT EXTRACTION PHACO AND INTRAOCULAR LENS PLACEMENT ([place]) MALYUGIN RIGHT 7.94  00:48.5;  Surgeon: Birder Robson, MD;  Location: [place];  Service: Ophthalmology;  Laterality: Right;   COLONOSCOPY WITH PROPOFOL N/A 08/01/2015   Procedure: COLONOSCOPY WITH PROPOFOL;  Surgeon: Josefine Class, MD;  Location: [facility] ENDOSCOPY;  Service: Endoscopy;  Laterality: N/A;   COLONOSCOPY WITH PROPOFOL N/A 09/25/2019   Procedure: COLONOSCOPY WITH PROPOFOL;  Surgeon: Robert Bellow, MD;  Location: [facility] ENDOSCOPY;  Service: Endoscopy;  Laterality: N/A;   CORONARY PRESSURE WIRE/FFR WITH 3D MAPPING N/A 12/07/2021   Procedure: Coronary Pressure Wire/FFR w/3D Mapping;  Surgeon: Isaias Cowman, MD;  Location: [place] [facility];  Service: Cardiovascular;  Laterality: N/A;   CYSTOSCOPY/URETEROSCOPY/HOLMIUM LASER/STENT PLACEMENT Right 12/16/2020   Procedure: CYSTOSCOPY/URETEROSCOPY/HOLMIUM LASER/STENT PLACEMENT;  Surgeon: Billey Co, MD;  Location: [facility] ORS;  Service: Urology;  Laterality: Right;   DIAGNOSTIC LAPAROSCOPY  FEMORAL ARTERY REPAIR     HERNIA REPAIR     JOINT REPLACEMENT Right    knee   KNEE ARTHROPLASTY Right 12/05/2015   Procedure: COMPUTER ASSISTED TOTAL KNEE ARTHROPLASTY;  Surgeon: Dereck Leep, MD;  Location: [facility] ORS;  Service: Orthopedics;  Laterality: Right;   knee arthroscopy     LAMINECTOMY     LEFT HEART CATH AND CORONARY ANGIOGRAPHY N/A 12/07/2021   Procedure: LEFT HEART CATH AND CORONARY ANGIOGRAPHY;  Surgeon: Isaias Cowman, MD;  Location: [place] [facility];  Service:  Cardiovascular;  Laterality: N/A;   LITHOTRIPSY     LITHOTRIPSY     SPINAL FUSION     TOE AMPUTATION     TONSILLECTOMY      FHx:  Family History  Problem Relation Age of Onset   Prostate cancer Neg Hx    Kidney disease Neg Hx     Social History:  reports that he quit smoking about 30 years ago. His smoking use included cigarettes. He has a 51.00 pack-year smoking history. He has been exposed to tobacco smoke. He has never used smokeless tobacco. He reports that he does not drink alcohol and does not use drugs.  Allergies: No Known Allergies  Medications Prior to Admission  Medication Sig Dispense Refill   allopurinol (ZYLOPRIM) 300 MG tablet Take 300 mg by mouth daily as needed (gout flare).     diclofenac Sodium (VOLTAREN) 1 % GEL Apply 2 g topically 2 (two) times daily.     finasteride (PROSCAR) 5 MG tablet Take 5 mg by mouth daily.     losartan (COZAAR) 100 MG tablet Take 100 mg by mouth at bedtime.     memantine (NAMENDA) 5 MG tablet Take 5 mg by mouth 2 (two) times daily.     mirabegron ER (MYRBETRIQ) 50 MG TB24 tablet Take 1 tablet (50 mg total) by mouth daily. 28 tablet 11   mirtazapine (REMERON) 15 MG tablet Take 15 mg by mouth at bedtime.     pregabalin (LYRICA) 25 MG capsule Take 25 mg by mouth daily as needed (1 to 2 capsules as needed).     rosuvastatin (CRESTOR) 5 MG tablet Take 5 mg by mouth daily.     senna-docusate (SENOKOT-S) 8.6-50 MG tablet Take 1 tablet by mouth 2 (two) times daily. Decrease to 1x daily or hold for diarrhea/loose stools 30 tablet 11   spironolactone (ALDACTONE) 25 MG tablet Take 25 mg by mouth at bedtime.     tamsulosin (FLOMAX) 0.4 MG CAPS capsule Take 0.4 mg by mouth daily.      Physical Exam: General: Alert and oriented.  No apparent distress. Per emergency room, patient had palpable DP/PT pulses left foot with no skin openings and mild edema.  Splinted intact today to the left lower extremity.  Capillary fill time intact to digits left  foot.  Previous left third toe amputation.  Patient able to dorsiflex and plantarflex digits left foot.  Results for orders placed or performed during the hospital encounter of 09/23/22 (from the past 48 hour(s))  Comprehensive metabolic panel     Status: Abnormal   Collection Time: 09/23/22  2:07 PM  Result Value Ref Range   Sodium 137 135 - 145 mmol/L   Potassium 3.9 3.5 - 5.1 mmol/L   Chloride 107 98 - 111 mmol/L   CO2 23 22 - 32 mmol/L   Glucose, Bld 129 (H) 70 - 99 mg/dL    Comment: Glucose reference range applies only to samples  taken after fasting for at least 8 hours.   BUN 22 8 - 23 mg/dL   Creatinine, Ser 1.08 0.61 - 1.24 mg/dL   Calcium 9.4 8.9 - 10.3 mg/dL   Total Protein 7.0 6.5 - 8.1 g/dL   Albumin 4.0 3.5 - 5.0 g/dL   AST 35 15 - 41 U/L   ALT 24 0 - 44 U/L   Alkaline Phosphatase 54 38 - 126 U/L   Total Bilirubin 0.8 0.3 - 1.2 mg/dL   GFR, Estimated >60 >60 mL/min    Comment: (NOTE) Calculated using the CKD-EPI Creatinine Equation (2021)    Anion gap 7 5 - 15    Comment: Performed at [facility], [place], [place] [address]  Troponin I (High Sensitivity)     Status: None   Collection Time: 09/23/22  2:07 PM  Result Value Ref Range   Troponin I (High Sensitivity) 4 <18 ng/L    Comment: (NOTE) Elevated high sensitivity troponin I (hsTnI) values and significant  changes across serial measurements may suggest ACS but many other  chronic and acute conditions are known to elevate hsTnI results.  Refer to the "Links" section for chest pain algorithms and additional  guidance. Performed at [facility], [address] [place], [place] [address]   CBC with Differential     Status: Abnormal   Collection Time: 09/23/22  2:07 PM  Result Value Ref Range   WBC 7.5 4.0 - 10.5 K/uL   RBC 4.62 4.22 - 5.81 MIL/uL   Hemoglobin 14.3 13.0 - 17.0 g/dL   HCT 42.0 39.0 - 52.0 %   MCV 90.9 80.0 - 100.0 fL   MCH 31.0 26.0 - 34.0 pg   MCHC 34.0  30.0 - 36.0 g/dL   RDW 13.7 11.5 - 15.5 %   Platelets 138 (L) 150 - 400 K/uL   nRBC 0.0 0.0 - 0.2 %   Neutrophils Relative % 67 %   Neutro Abs 5.1 1.7 - 7.7 K/uL   Lymphocytes Relative 22 %   Lymphs Abs 1.7 0.7 - 4.0 K/uL   Monocytes Relative 8 %   Monocytes Absolute 0.6 0.1 - 1.0 K/uL   Eosinophils Relative 2 %   Eosinophils Absolute 0.2 0.0 - 0.5 K/uL   Basophils Relative 1 %   Basophils Absolute 0.0 0.0 - 0.1 K/uL   Immature Granulocytes 0 %   Abs Immature Granulocytes 0.03 0.00 - 0.07 K/uL    Comment: Performed at [facility], [place], [place] [address]  Troponin I (High Sensitivity)     Status: None   Collection Time: 09/23/22  5:06 PM  Result Value Ref Range   Troponin I (High Sensitivity) 4 <18 ng/L    Comment: (NOTE) Elevated high sensitivity troponin I (hsTnI) values and significant  changes across serial measurements may suggest ACS but many other  chronic and acute conditions are known to elevate hsTnI results.  Refer to the "Links" section for chest pain algorithms and additional  guidance. Performed at [facility], [address] [place], [place] [address]   Vitamin B12     Status: None   Collection Time: 09/23/22  5:06 PM  Result Value Ref Range   Vitamin B-12 343 180 - 914 pg/mL    Comment: (NOTE) This assay is not validated for testing neonatal or myeloproliferative syndrome specimens for Vitamin B12 levels. Performed at [facility], [place] [address] [place], [place] [address]  Basic metabolic panel     Status: Abnormal   Collection Time: 09/24/22  6:37 AM  Result Value Ref Range   Sodium 138 135 - 145 mmol/L   Potassium 3.5 3.5 - 5.1 mmol/L   Chloride 106 98 - 111 mmol/L   CO2 25 22 - 32 mmol/L   Glucose, Bld 116 (H) 70 - 99 mg/dL    Comment: Glucose reference range applies only to samples taken after fasting for at least 8 hours.   BUN 22 8 - 23 mg/dL   Creatinine, Ser 1.25 (H) 0.61 - 1.24 mg/dL    Calcium 9.1 8.9 - 10.3 mg/dL   GFR, Estimated >60 >60 mL/min    Comment: (NOTE) Calculated using the CKD-EPI Creatinine Equation (2021)    Anion gap 7 5 - 15    Comment: Performed at [facility], [address] [place], [place] [address]  CBC     Status: Abnormal   Collection Time: 09/24/22  6:37 AM  Result Value Ref Range   WBC 7.0 4.0 - 10.5 K/uL   RBC 4.32 4.22 - 5.81 MIL/uL   Hemoglobin 13.4 13.0 - 17.0 g/dL   HCT 39.7 39.0 - 52.0 %   MCV 91.9 80.0 - 100.0 fL   MCH 31.0 26.0 - 34.0 pg   MCHC 33.8 30.0 - 36.0 g/dL   RDW 13.7 11.5 - 15.5 %   Platelets 128 (L) 150 - 400 K/uL   nRBC 0.0 0.0 - 0.2 %    Comment: Performed at [facility], [address], [place], [place] [address]   CT Ankle Left Wo Contrast  Result Date: 09/23/2022 CLINICAL DATA:  Trimalleolar ankle fracture post reduction. EXAM: CT OF THE LEFT ANKLE WITHOUT CONTRAST TECHNIQUE: Multidetector CT imaging of the left ankle was performed according to the standard protocol. Multiplanar CT image reconstructions were also generated. RADIATION DOSE REDUCTION: This exam was performed according to the departmental dose-optimization program which includes automated exposure control, adjustment of the mA and/or kV according to patient size and/or use of iterative reconstruction technique. COMPARISON:  Radiographs same day. FINDINGS: Bones/Joint/Cartilage The ankle is splinted. There is near anatomic reduction of the comminuted fracture of the distal fibular diaphysis, centered approximately 5 cm proximal to the tibial plafond. The distal fibula is intact. There is near anatomic reduction of the oblique fracture through the base of the medial malleolus. The fracture of the posterior malleolus also demonstrates near anatomic reduction. Intra-articular avulsion fracture involving the anterolateral aspect of the distal tibia mediated by the anterior inferior tibiofibular ligament demonstrates 10 mm of lateral  displacement (image 142/5). No residual subluxation of the talar dome. There is a small ankle joint effusion with probable small intra-articular fracture fragments both anteriorly and posteriorly. The talar dome appears intact. There is suspicion of a nondisplaced intra-articular fracture involving the base of the 3rd metatarsal, best seen on the axial images. No apparent significant subluxation at the Lisfranc joint. The additional tarsal bones appear intact. Ligaments Suboptimally assessed by CT. As above, probable avulsion fracture of the anterolateral distal tibia by the anterior inferior tibiofibular ligament. Muscles and Tendons Diffuse fatty atrophy of the visualized distal gastrocnemius musculature. There are calcifications associated with the peroneal tendons. The ankle tendons appear intact without entrapment in the fractures. There are prominent calcifications within the plantar fascia which appear chronic. Soft tissues Soft tissue swelling in the lower leg without foreign body or soft tissue emphysema. No significant focal hematoma. IMPRESSION: 1. Near anatomic reduction of  the trimalleolar fracture status post reduction and splinting. 2. Intra-articular avulsion fracture of the anterolateral aspect of the distal tibia mediated by the anterior inferior tibiofibular ligament. 3. Suspected nondisplaced intra-articular fracture involving the base of the 3rd metatarsal. 4. No residual subluxation of the talar dome. Small joint effusion with probable small intra-articular fracture fragments. 5. Diffuse fatty atrophy of the visualized distal gastrocnemius musculature. Electronically Signed   By: Richardean Sale M.D.   On: 09/23/2022 16:17   DG Ankle 2 Views Left  Result Date: 09/23/2022 CLINICAL DATA:  Postreduction. EXAM: LEFT ANKLE - 2 VIEW COMPARISON:  Ankle and lower leg radiographs earlier the same date. FINDINGS: 1525 hours. Interval improved alignment of the previously demonstrated trimalleolar  fracture with near anatomic reduction. The fracture of the distal fibular diaphysis is mildly comminuted. There is a possible small fracture fragment between the medial aspect of the tibial plafond and talar dome. No significant residual subluxation of the talus. Associated soft tissue injury. IMPRESSION: Improved alignment of the trimalleolar fracture post reduction as described. Electronically Signed   By: Richardean Sale M.D.   On: 09/23/2022 15:40   DG Tibia/Fibula Left  Result Date: 09/23/2022 CLINICAL DATA:  [place].  Pain and deformity. EXAM: LEFT TIBIA AND FIBULA - 2 VIEW COMPARISON:  Ankle images same day FINDINGS: Proximal tibia and fibula are negative. No evidence of knee injury. See ankle report regarding trimalleolar fracture dislocation. IMPRESSION: Proximal tibia and fibula are negative. See ankle report regarding trimalleolar fracture dislocation. Electronically Signed   By: Nelson Chimes M.D.   On: 09/23/2022 14:30   DG Ankle Complete Left  Result Date: 09/23/2022 CLINICAL DATA:  Fall with pain and deformity EXAM: LEFT ANKLE COMPLETE - 3+ VIEW COMPARISON:  None Available. FINDINGS: Trimalleolar fracture dislocation. Oblique fracture of the distal fibular diaphysis with anterior angulation. Transverse fracture of the medial malleolus. Marked widening of the ankle mortise. Coronal fracture of the posterior tibial lip. Perching of the posterior tibia on the talar dome. IMPRESSION: Trimalleolar fracture dislocation of the left ankle. Electronically Signed   By: Nelson Chimes M.D.   On: 09/23/2022 14:29    Blood pressure (!) 150/71, pulse 72, temperature 97.7 F (36.5 C), resp. rate 18, height 5\' 9"  (1.753 m), weight (!) 156.9 kg, SpO2 96 %.  Assessment Trimalleolar left ankle fracture, closed, displaced Cystic changes talus left Neuropathy  Plan -Patient seen and examined. -X-ray imaging and CT imaging reviewed and discussed with patient in detail.  Appears show a trimalleolar ankle  fracture which appears to be more well reduced at this time after closed reduction was performed by emergency room staff.  Also appears to show some cystic changes within the talus. -Patient splint dressing left clean, dry, and intact today since reduction.  Per emergency room staff patient had palpable pulses with minimal swelling and no skin openings. -All treatment options were discussed with the patient both conservative and surgical attempts at correction include potential risks and complications at this time patient is elected for surgical intervention consisting of left ankle fracture open reduction with internal fixation with possible ankle arthroscopy and intraosseous bio plasty injection/bone marrow aspirate harvest.  No guarantees given.  Discussed major risks involved with the procedure.  Discussed incision healing and infection possibility as well as posttraumatic arthritis, discussed nerve damage, discussed all other complications in detail.  Patient agreeable. -Patient currently n.p.o. for surgery -plan for procedure on 1230 today  Caroline More, DPM 09/24/2022, 8:34 AM

## 2022-09-24 NOTE — Anesthesia Preprocedure Evaluation (Addendum)
Anesthesia Evaluation  Patient identified by MRN, date of birth, ID band Patient confused  General Assessment Comment:1mg  Dilaudid at 8am is reported to cause altered mental status in patient. Family reports patient has dementia at baseline. Pt follows commands and moves all extremities. No gross CN abnormalities.   Reviewed: Allergy & Precautions, H&P , NPO status , Patient's Chart, lab work & pertinent test results  Airway Mallampati: II  TM Distance: >3 FB Neck ROM: full    Dental no notable dental hx.    Pulmonary sleep apnea , former smoker   Pulmonary exam normal        Cardiovascular Exercise Tolerance: Poor hypertension, Normal cardiovascular exam  Personal history of PE (pulmonary embolism)   Neuro/Psych  PSYCHIATRIC DISORDERS     Dementia R foot drop followed by neurosurgery s/p spinal fusion  Neuromuscular disease    GI/Hepatic Neg liver ROS,GERD  ,,  Endo/Other  negative endocrine ROS    Renal/GU negative Renal ROS  negative genitourinary   Musculoskeletal  (+) Arthritis ,    Abdominal   Peds  Hematology negative hematology ROS (+)   Anesthesia Other Findings Past Medical History: No date: Acquired spondylolisthesis No date: BPH (benign prostatic hyperplasia) No date: Bradycardia No date: Burn No date: Bursitis     Comment:  right elbow  No date: Cancer     Comment:  skin cancer No date: Complication of anesthesia     Comment:  difficult to wake up after a lithotripsy No date: DJD (degenerative joint disease) No date: Foot drop     Comment:  bil. 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 No date: Nephrolithiasis No date: Obesity No date: Peripheral polyneuropathy No date: Personal history of PE (pulmonary embolism) No date: PONV (postoperative nausea and vomiting)     Comment:  after cataract surgery No date: Pre-diabetes No date: Scoliosis No date: Sleep apnea     Comment:  pt denies No date: SOB (shortness of breath) No date: Spinal stenosis, lumbar region without neurogenic  claudication No date: Urine frequency No date: UTI (urinary tract infection) during pregnancy, unspecified  trimester  Past Surgical History: No date: CATARACT EXTRACTION 08/25/2019: CATARACT EXTRACTION W/PHACO; Right     Comment:  Procedure: CATARACT EXTRACTION PHACO AND INTRAOCULAR               LENS PLACEMENT ([facility]) MALYUGIN RIGHT 7.94  00:48.5;                Surgeon: Birder Robson, MD;  Location: [facility];  Service: Ophthalmology;  Laterality: Right; 08/01/2015: COLONOSCOPY WITH PROPOFOL; N/A     Comment:  Procedure: COLONOSCOPY WITH PROPOFOL;  Surgeon: Josefine Class, MD;  Location[facility] ENDOSCOPY;  Service:               Endoscopy;  Laterality: N/A; 09/25/2019: COLONOSCOPY WITH PROPOFOL; N/A     Comment:  Procedure: COLONOSCOPY WITH PROPOFOL;  Surgeon: Robert Bellow, MD;  Location: [facility] ENDOSCOPY;  Service:               Endoscopy;  Laterality: N/A; 12/07/2021: CORONARY PRESSURE WIRE/FFR WITH 3D MAPPING; N/A     Comment:  Procedure: Coronary Pressure Wire/FFR w/3D Mapping;                Surgeon: Isaias Cowman, MD;  Location: [place] [facility];  Service: Cardiovascular;  Laterality:               N/A; 12/16/2020: CYSTOSCOPY/URETEROSCOPY/HOLMIUM LASER/STENT PLACEMENT;  Right     Comment:  Procedure: CYSTOSCOPY/URETEROSCOPY/HOLMIUM LASER/STENT                PLACEMENT;  Surgeon: Billey Co, MD;  Location:               [facility] ORS;  Service: Urology;  Laterality: Right; No date: DIAGNOSTIC LAPAROSCOPY No date: FEMORAL ARTERY REPAIR No date: HERNIA REPAIR No date: JOINT REPLACEMENT; Right     Comment:  knee 12/05/2015: KNEE ARTHROPLASTY; Right     Comment:  Procedure: COMPUTER ASSISTED TOTAL KNEE ARTHROPLASTY;                Surgeon: Dereck Leep, MD;  Location: [facility] ORS;                Service: Orthopedics;  Laterality: Right; No date: knee arthroscopy No date: LAMINECTOMY 12/07/2021: LEFT HEART CATH AND CORONARY ANGIOGRAPHY; N/A     Comment:  Procedure: LEFT HEART CATH AND CORONARY ANGIOGRAPHY;                Surgeon: Isaias Cowman, MD;  Location: [place] [facility];  Service: Cardiovascular;  Laterality:               N/A; No date: LITHOTRIPSY No date: LITHOTRIPSY No date: SPINAL FUSION No date: TOE AMPUTATION No date: TONSILLECTOMY  BMI    Body Mass Index: 51.10 kg/m      Reproductive/Obstetrics negative OB ROS                             Anesthesia Physical Anesthesia Plan  ASA: 3  Anesthesia Plan: MAC and Regional   Post-op Pain Management: Regional block* and Tylenol PO (pre-op)*   Induction: Intravenous  PONV Risk Score and Plan: Propofol infusion and TIVA  Airway Management Planned: Natural Airway  Additional Equipment:   Intra-op Plan:   Post-operative Plan:   Informed Consent: I have reviewed the patients History and Physical, chart, labs and discussed the procedure including the risks, benefits and alternatives for the proposed anesthesia with the patient or authorized representative who has indicated his/her understanding and acceptance.     Dental Advisory Given and Consent reviewed with POA  Plan Discussed with: CRNA and Surgeon  Anesthesia Plan Comments: (Risks of regional anesthesia explained. Wife accepts risks. Back-up GA discussed)        Anesthesia Quick Evaluation

## 2022-09-24 NOTE — Transfer of Care (Signed)
Immediate Anesthesia Transfer of Care Note  Patient: Thomas Mcdaniel  Procedure(s) Performed: OPEN REDUCTION INTERNAL FIXATION (ORIF) ANKLE FRACTURE (Left: Ankle)  Patient Location: PACU  Anesthesia Type:MAC  Level of Consciousness: drowsy and responds to stimulation  Airway & Oxygen Therapy: Patient Spontanous Breathing and Patient connected to face mask oxygen  Post-op Assessment: Report given to RN and Post -op Vital signs reviewed and stable  Post vital signs: Reviewed and stable  Last Vitals:  Vitals Value Taken Time  BP 114/92 09/24/22 1550  Temp    Pulse 98 09/24/22 1554  Resp 29 09/24/22 1554  SpO2 95 % 09/24/22 1554  Vitals shown include unvalidated device data.  Last Pain:  Vitals:   09/24/22 1204  TempSrc: Temporal  PainSc:       Patients Stated Pain Goal: 0 ([id number])  Complications: No notable events documented.

## 2022-09-24 NOTE — H&P (Signed)
HISTORY AND PHYSICAL INTERVAL NOTE:  09/24/2022  12:19 PM  Thomas Mcdaniel  has presented today for surgery, with the diagnosis of left trimalleolar ankle fracture with syndesmotic disruption and cystic changes within the talus.  Discussed surgery with patient in detail and patient was agreeable this morning.  Patient was given dose of Dilaudid and apparently had change in mentation.  This required consent to be obtained through wife.  The various methods of treatment have been discussed with the patient and patient's family.  Discussed risks and complications.  No guarantees were given.  After consideration of risks, benefits and other options for treatment, the patient and family have consented to surgery.  I have reviewed the patients' chart and labs.    Procedure: 1.  Right trimalleolar ankle fracture open reduction with internal fixation 2.  Right ankle syndesmotic fixation 3.  Right ankle arthroscopy with intraosseous bio plasty injection with bone marrow aspirate taken from calcaneus  A history and physical examination was performed in the hospital.  The patient was reexamined.  There have been no changes to this history and physical examination.  Caroline More, DPM

## 2022-09-25 DIAGNOSIS — S82892A Other fracture of left lower leg, initial encounter for closed fracture: Secondary | ICD-10-CM | POA: Diagnosis not present

## 2022-09-25 DIAGNOSIS — R410 Disorientation, unspecified: Secondary | ICD-10-CM | POA: Diagnosis not present

## 2022-09-25 LAB — CBC WITH DIFFERENTIAL/PLATELET
Abs Immature Granulocytes: 0.04 10*3/uL (ref 0.00–0.07)
Basophils Absolute: 0 10*3/uL (ref 0.0–0.1)
Basophils Relative: 0 %
Eosinophils Absolute: 0 10*3/uL (ref 0.0–0.5)
Eosinophils Relative: 0 %
HCT: 44.9 % (ref 39.0–52.0)
Hemoglobin: 14.5 g/dL (ref 13.0–17.0)
Immature Granulocytes: 0 %
Lymphocytes Relative: 7 %
Lymphs Abs: 0.7 10*3/uL (ref 0.7–4.0)
MCH: 30.8 pg (ref 26.0–34.0)
MCHC: 32.3 g/dL (ref 30.0–36.0)
MCV: 95.3 fL (ref 80.0–100.0)
Monocytes Absolute: 0.6 10*3/uL (ref 0.1–1.0)
Monocytes Relative: 6 %
Neutro Abs: 8.6 10*3/uL — ABNORMAL HIGH (ref 1.7–7.7)
Neutrophils Relative %: 87 %
Platelets: 107 10*3/uL — ABNORMAL LOW (ref 150–400)
RBC: 4.71 MIL/uL (ref 4.22–5.81)
RDW: 13.6 % (ref 11.5–15.5)
WBC: 9.9 10*3/uL (ref 4.0–10.5)
nRBC: 0 % (ref 0.0–0.2)

## 2022-09-25 LAB — BASIC METABOLIC PANEL
Anion gap: 9 (ref 5–15)
BUN: 18 mg/dL (ref 8–23)
CO2: 26 mmol/L (ref 22–32)
Calcium: 9.4 mg/dL (ref 8.9–10.3)
Chloride: 103 mmol/L (ref 98–111)
Creatinine, Ser: 0.92 mg/dL (ref 0.61–1.24)
GFR, Estimated: >60 mL/min (ref >60)
Glucose, Bld: 128 mg/dL — ABNORMAL HIGH (ref 70–99)
Potassium: 3.7 mmol/L (ref 3.5–5.1)
Sodium: 138 mmol/L (ref 135–145)

## 2022-09-25 LAB — CULTURE, URINE COMPREHENSIVE

## 2022-09-25 MED ORDER — LOSARTAN POTASSIUM 50 MG PO TABS: 100.0000 mg | ORAL_TABLET | Freq: Every day | ORAL | Status: DC

## 2022-09-25 MED ORDER — SPIRONOLACTONE 25 MG PO TABS
25.0000 mg | ORAL_TABLET | Freq: Every day | ORAL | Status: DC
  Administered 2022-09-25 – 2022-09-26 (×2): 25 mg via ORAL
  Filled 2022-09-25 (×2): qty 1

## 2022-09-25 MED ORDER — ENOXAPARIN SODIUM 60 MG/0.6ML IJ SOSY
0.5000 mg/kg | PREFILLED_SYRINGE | INTRAMUSCULAR | Status: DC
  Administered 2022-09-25 – 2022-09-26 (×2): 55 mg via SUBCUTANEOUS
  Filled 2022-09-25 (×2): qty 0.6

## 2022-09-25 MED ORDER — SPIRONOLACTONE 25 MG PO TABS: 25.0000 mg | ORAL_TABLET | Freq: Every day | ORAL | Status: DC

## 2022-09-25 MED ORDER — LOSARTAN POTASSIUM 50 MG PO TABS
100.0000 mg | ORAL_TABLET | Freq: Every day | ORAL | Status: DC
  Administered 2022-09-25 – 2022-09-26 (×2): 100 mg via ORAL
  Filled 2022-09-25 (×2): qty 2

## 2022-09-25 NOTE — Evaluation (Signed)
Physical Therapy Evaluation Patient Details Name: Thomas Mcdaniel MRN: [id number] DOB: Sep 05, 1945 Today's Date: 09/25/2022  History of Present Illness  Pt is a 77 y.o. male with PMH that includes hypertension, RLE drop foot, overactive bladder, B12 deficiency, neuropathy with frequent falls, HOH, dementia, bradycardia, and R TKA who presents to the ED after a ground-level fall.  Pt diagnosed with displaced left trimalleolar ankle fracture and is s/p ORIF, syndesmotic fixation ankle, and talus bone graft.   Clinical Impression  Pt somewhat lethargic with flat affect but easily awakens to verbal stimulus.  Pt able to follow most simple commands with extra time and cuing but somewhat impulsive during the session.  Pt required extensive assistance with bed mobility tasks but presented with fair static sitting balance at the EOB.  Transfer attempt deferred secondary to pt's deficits in cognition and impulsivity combined with LLE NWB status.  Pt's SpO2 on room air was in the low 90s during the session with HR WNL.  Pt will benefit from continued PT services upon discharge to safely address deficits listed in patient problem list for decreased caregiver assistance and eventual return to PLOF.      Recommendations for follow up therapy are one component of a multi-disciplinary discharge planning process, led by the attending physician.  Recommendations may be updated based on patient status, additional functional criteria and insurance authorization.  Follow Up Recommendations Can patient physically be transported by private vehicle: No     Assistance Recommended at Discharge Frequent or constant Supervision/Assistance  Patient can return home with the following  Two people to help with walking and/or transfers;A lot of help with bathing/dressing/bathroom;Assistance with cooking/housework;Direct supervision/assist for medications management;Assist for transportation    Equipment Recommendations None  recommended by PT  Recommendations for Other Services       Functional Status Assessment Patient has had a recent decline in their functional status and demonstrates the ability to make significant improvements in function in a reasonable and predictable amount of time.     Precautions / Restrictions Precautions Precautions: Fall Required Braces or Orthoses: Splint/Cast Splint/Cast - Date Prophylactic Dressing Applied (if applicable): [id number] Restrictions Weight Bearing Restrictions: Yes LLE Weight Bearing: Non weight bearing      Mobility  Bed Mobility Overal bed mobility: Needs Assistance Bed Mobility: Rolling, Supine to Sit, Sit to Supine Rolling: Mod assist   Supine to sit: Max assist Sit to supine: Max assist   General bed mobility comments: Max A for BLE and trunk control durng sup to/from sit    Transfers                   General transfer comment: Unsafe to attempt at this time secondary to cognitive deficits    Ambulation/Gait                  Stairs            Wheelchair Mobility    Modified Rankin (Stroke Patients Only)       Balance Overall balance assessment: Needs assistance   Sitting balance-Leahy Scale: Fair         Standing balance comment: Deferred for pt safety                             Pertinent Vitals/Pain Pain Assessment Pain Assessment: PAINAD Breathing: normal Negative Vocalization: none Facial Expression: smiling or inexpressive Body Language: relaxed Consolability: no need to console PAINAD Score: 0  Home Living Family/patient expects to be discharged to:: Private residence Living Arrangements: Spouse/significant other Available Help at Discharge: Family;Available 24 hours/day Type of Home: House Home Access: Level entry       Home Layout: One level Home Equipment: Conservation officer, nature (2 wheels);BSC/3in1;Rollator (4 wheels) Additional Comments: History obtained from spouse at  bedside secondary to pt confusion    Prior Function Prior Level of Function : Independent/Modified Independent;History of Falls (last six months)             Mobility Comments: Mod ind amb limited community distances with a rollator with occasional one or two SPC use in the community, 12-15 falls in the last 6 months secondary to tripping/LOB ADLs Comments: Ind with ADLs, occasional assist with very small buttons     Hand Dominance   Dominant Hand: Right    Extremity/Trunk Assessment   Upper Extremity Assessment Upper Extremity Assessment: Generalized weakness    Lower Extremity Assessment Lower Extremity Assessment: Generalized weakness;LLE deficits/detail LLE: Unable to fully assess due to pain;Unable to fully assess due to immobilization       Communication   Communication: HOH  Cognition Arousal/Alertness: Lethargic Behavior During Therapy: Flat affect Overall Cognitive Status: Impaired/Different from baseline Area of Impairment: Orientation, Memory, Attention, Following commands, Safety/judgement, Awareness                               General Comments: Per spouse pt with some STM issues at baseline but typically alert and oriented and able to follow all commands        General Comments      Exercises Total Joint Exercises Long Arc Quad: AROM, Strengthening, Both, 10 reps Knee Flexion: AROM, Strengthening, Both, 10 reps   Assessment/Plan    PT Assessment Patient needs continued PT services  PT Problem List Decreased strength;Decreased activity tolerance;Decreased balance;Decreased mobility;Decreased cognition;Decreased safety awareness;Decreased knowledge of precautions       PT Treatment Interventions DME instruction;Gait training;Functional mobility training;Patient/family education;Therapeutic activities;Therapeutic exercise;Balance training    PT Goals (Current goals can be found in the Care Plan section)  Acute Rehab PT Goals PT Goal  Formulation: Patient unable to participate in goal setting Time For Goal Achievement: 10/08/22 Potential to Achieve Goals: Fair    Frequency BID     Co-evaluation               AM-PAC PT "6 Clicks" Mobility  Outcome Measure Help needed turning from your back to your side while in a flat bed without using bedrails?: A Lot Help needed moving from lying on your back to sitting on the side of a flat bed without using bedrails?: Total Help needed moving to and from a bed to a chair (including a wheelchair)?: Total Help needed standing up from a chair using your arms (e.g., wheelchair or bedside chair)?: Total Help needed to walk in hospital room?: Total Help needed climbing 3-5 steps with a railing? : Total 6 Click Score: 7    End of Session   Activity Tolerance: Patient tolerated treatment well Patient left: in bed;with call bell/phone within reach;with family/visitor present;with nursing/sitter in room;Other (comment) (Pt left with nursing for purewick placement) Nurse Communication: Mobility status;Weight bearing status PT Visit Diagnosis: History of falling (Z91.81);Unsteadiness on feet (R26.81);Other abnormalities of gait and mobility (R26.89);Muscle weakness (generalized) (M62.81)    Time: [id number] PT Time Calculation (min) (ACUTE ONLY): 20 min   Charges:   PT Evaluation $PT Eval Moderate  Complexity: 1 Mod        D. Scott Maurica Omura PT, DPT 09/25/22, 3:16 PM

## 2022-09-25 NOTE — Progress Notes (Signed)
(  Note is from 09/24/22 - 1730) Patient returned to the unit from same day surgery. Patient did not have restraints applied. Order was d/c'ed by MD. We will continue to monitor patient to ensure has safety and notify MD as needed.

## 2022-09-25 NOTE — Progress Notes (Signed)
1 Day Post-Op   Subjective/Chief Complaint: Patient seen.  Seemed somewhat confused and not very communicative.  Some pain with the ankle.   Objective: Vital signs in last 24 hours: Temp:  [97 F (36.1 C)-98.4 F (36.9 C)] 98.4 F (36.9 C) (04/02 0507) Pulse Rate:  [82-97] 93 (04/02 0507) Resp:  [11-18] 18 (04/02 0507) BP: (109-144)/(68-112) 144/68 (04/02 0507) SpO2:  [88 %-97 %] 97 % (04/02 0507) Last BM Date : 09/22/22  Intake/Output from previous day: 04/01 0701 - 04/02 0700 In: 1000 [I.V.:800; IV Piggyback:200] Out: 1230 [Urine:1200; Blood:30] Intake/Output this shift: No intake/output data recorded.  The splint and dressing are intact and dry on the left foot.  Capillary refill intact to the digits.  He is able to slightly move his toes.  Lab Results:  Recent Labs    09/24/22 0637 09/25/22 0521  WBC 7.0 9.9  HGB 13.4 14.5  HCT 39.7 44.9  PLT 128* 107*   BMET Recent Labs    09/23/22 1407 09/24/22 0637  NA 137 138  K 3.9 3.5  CL 107 106  CO2 23 25  GLUCOSE 129* 116*  BUN 22 22  CREATININE 1.08 1.25*  CALCIUM 9.4 9.1   PT/INR No results for input(s): "LABPROT", "INR" in the last 72 hours. ABG No results for input(s): "PHART", "HCO3" in the last 72 hours.  Invalid input(s): "PCO2", "PO2"  Studies/Results: DG Ankle 2 Views Left  Result Date: 09/24/2022 CLINICAL DATA:  Elective surgery. Open reduction internal fixation of ankle fracture. EXAM: LEFT ANKLE - 2 VIEW COMPARISON:  Left ankle radiographs 09/23/2022 and CT left ankle 09/23/2022 FINDINGS: Images were performed intraoperatively without the presence of a radiologist. The patient is undergoing distal fibular lateral plate and screw fixation of the previously seen distal diaphyseal comminuted fracture. Associated tibiofibular screw fixation. Two new screws are seen traversing the medial malleolus. Total fluoroscopy images: 11 Total fluoroscopy time: 116 seconds Total dose: Radiation Exposure Index (as  provided by the fluoroscopic device): 6.13 mGy air Kerma Please see intraoperative findings for further detail. IMPRESSION: Fluoroscopic guidance provided for open reduction internal fixation of distal fibular and medial malleolar fractures. Electronically Signed   By: Yvonne Kendall M.D.   On: 09/24/2022 17:21   DG C-Arm 1-60 Min-No Report  Result Date: 09/24/2022 Fluoroscopy was utilized by the requesting physician.  No radiographic interpretation.   DG C-Arm 1-60 Min-No Report  Result Date: 09/24/2022 Fluoroscopy was utilized by the requesting physician.  No radiographic interpretation.   DG C-Arm 1-60 Min-No Report  Result Date: 09/24/2022 Fluoroscopy was utilized by the requesting physician.  No radiographic interpretation.   [place] OR NERVE BLOCK-IMAGE ONLY [facility])  Result Date: 09/24/2022 There is no interpretation for this exam.  This order is for images obtained during a surgical procedure.  Please See "Surgeries" Tab for more information regarding the procedure.   [place] OR NERVE BLOCK-IMAGE ONLY [facility])  Result Date: 09/24/2022 There is no interpretation for this exam.  This order is for images obtained during a surgical procedure.  Please See "Surgeries" Tab for more information regarding the procedure.   CT Ankle Left Wo Contrast  Result Date: 09/23/2022 CLINICAL DATA:  Trimalleolar ankle fracture post reduction. EXAM: CT OF THE LEFT ANKLE WITHOUT CONTRAST TECHNIQUE: Multidetector CT imaging of the left ankle was performed according to the standard protocol. Multiplanar CT image reconstructions were also generated. RADIATION DOSE REDUCTION: This exam was performed according to the departmental dose-optimization program which includes automated exposure control, adjustment of  the mA and/or kV according to patient size and/or use of iterative reconstruction technique. COMPARISON:  Radiographs same day. FINDINGS: Bones/Joint/Cartilage The ankle is splinted. There is near anatomic reduction of the  comminuted fracture of the distal fibular diaphysis, centered approximately 5 cm proximal to the tibial plafond. The distal fibula is intact. There is near anatomic reduction of the oblique fracture through the base of the medial malleolus. The fracture of the posterior malleolus also demonstrates near anatomic reduction. Intra-articular avulsion fracture involving the anterolateral aspect of the distal tibia mediated by the anterior inferior tibiofibular ligament demonstrates 10 mm of lateral displacement (image 142/5). No residual subluxation of the talar dome. There is a small ankle joint effusion with probable small intra-articular fracture fragments both anteriorly and posteriorly. The talar dome appears intact. There is suspicion of a nondisplaced intra-articular fracture involving the base of the 3rd metatarsal, best seen on the axial images. No apparent significant subluxation at the Lisfranc joint. The additional tarsal bones appear intact. Ligaments Suboptimally assessed by CT. As above, probable avulsion fracture of the anterolateral distal tibia by the anterior inferior tibiofibular ligament. Muscles and Tendons Diffuse fatty atrophy of the visualized distal gastrocnemius musculature. There are calcifications associated with the peroneal tendons. The ankle tendons appear intact without entrapment in the fractures. There are prominent calcifications within the plantar fascia which appear chronic. Soft tissues Soft tissue swelling in the lower leg without foreign body or soft tissue emphysema. No significant focal hematoma. IMPRESSION: 1. Near anatomic reduction of the trimalleolar fracture status post reduction and splinting. 2. Intra-articular avulsion fracture of the anterolateral aspect of the distal tibia mediated by the anterior inferior tibiofibular ligament. 3. Suspected nondisplaced intra-articular fracture involving the base of the 3rd metatarsal. 4. No residual subluxation of the talar dome.  Small joint effusion with probable small intra-articular fracture fragments. 5. Diffuse fatty atrophy of the visualized distal gastrocnemius musculature. Electronically Signed   By: Richardean Sale M.D.   On: 09/23/2022 16:17   DG Ankle 2 Views Left  Result Date: 09/23/2022 CLINICAL DATA:  Postreduction. EXAM: LEFT ANKLE - 2 VIEW COMPARISON:  Ankle and lower leg radiographs earlier the same date. FINDINGS: 1525 hours. Interval improved alignment of the previously demonstrated trimalleolar fracture with near anatomic reduction. The fracture of the distal fibular diaphysis is mildly comminuted. There is a possible small fracture fragment between the medial aspect of the tibial plafond and talar dome. No significant residual subluxation of the talus. Associated soft tissue injury. IMPRESSION: Improved alignment of the trimalleolar fracture post reduction as described. Electronically Signed   By: Richardean Sale M.D.   On: 09/23/2022 15:40   DG Tibia/Fibula Left  Result Date: 09/23/2022 CLINICAL DATA:  [place].  Pain and deformity. EXAM: LEFT TIBIA AND FIBULA - 2 VIEW COMPARISON:  Ankle images same day FINDINGS: Proximal tibia and fibula are negative. No evidence of knee injury. See ankle report regarding trimalleolar fracture dislocation. IMPRESSION: Proximal tibia and fibula are negative. See ankle report regarding trimalleolar fracture dislocation. Electronically Signed   By: Nelson Chimes M.D.   On: 09/23/2022 14:30   DG Ankle Complete Left  Result Date: 09/23/2022 CLINICAL DATA:  Fall with pain and deformity EXAM: LEFT ANKLE COMPLETE - 3+ VIEW COMPARISON:  None Available. FINDINGS: Trimalleolar fracture dislocation. Oblique fracture of the distal fibular diaphysis with anterior angulation. Transverse fracture of the medial malleolus. Marked widening of the ankle mortise. Coronal fracture of the posterior tibial lip. Perching of the posterior tibia on the talar  dome. IMPRESSION: Trimalleolar fracture  dislocation of the left ankle. Electronically Signed   By: Nelson Chimes M.D.   On: 09/23/2022 14:29    Anti-infectives: Anti-infectives (From admission, onward)    Start     Dose/Rate Route Frequency Ordered Stop   09/24/22 0600  ceFAZolin (ANCEF) IVPB 3g/100 mL premix  Status:  Discontinued        3 g 200 mL/hr over 30 Minutes Intravenous On call to O.R. 09/23/22 1621 09/24/22 1707       Assessment/Plan: s/p Procedure(s): OPEN REDUCTION INTERNAL FIXATION (ORIF) ANKLE FRACTURE, SYNDESMOTIC FIXATION, TALUS RETROGRADE FILLING CYSTIC CHANGES WITHIN THE TALUS WITH BONE GRAFT (Left) Assessment: Stable status post ORIF left ankle fracture  Plan: Splint and dressing left intact.  Patient will need to remain nonweightbearing on the left lower extremity.  Patient will follow-up with Dr. Luana Shu as scheduled.  LOS: 1 day    Durward Fortes 09/25/2022

## 2022-09-25 NOTE — Evaluation (Signed)
Occupational Therapy Evaluation Patient Details Name: Thomas Mcdaniel MRN: [id number] DOB: 06/17/1946 Today's Date: 09/25/2022   History of Present Illness Pt is a 77 y.o. male with PMH that includes hypertension, RLE drop foot, overactive bladder, B12 deficiency, neuropathy with frequent falls, HOH, dementia, bradycardia, and R TKA who presents to the ED after a ground-level fall.  Pt diagnosed with displaced left trimalleolar ankle fracture and is s/p ORIF, syndesmotic fixation ankle, and talus bone graft.   Clinical Impression   Patient received supine in bed with spouse. Very lethargic with flat affect. PLOF/home set up obtained from spouse. Per chart review, pt with cognitive deficits at baseline although spouse reports pt is normally A&O. PTA pt lived with spouse, was independent for ADLs, received assistance as needed for IADLs, and was Mod I for functional mobility using a rollator/SPC. Pt currently functioning at Max A for UB dressing, total A for LB dressing, and set up A + VC for washing his face at bed level. Pt required Max A to initiate BLE movement. Pt demonstrated difficulty maintaining alertness (keeping eye closed t/o) and following one step commands, therefore, further EOB mobility deferred due to safety. Pt will benefit from skilled acute OT services to address deficits noted below. OT recommends ongoing therapy upon discharge to maximize safety and independence with ADLs, decrease fall risk, decrease caregiver burden, and promote return to PLOF.     Recommendations for follow up therapy are one component of a multi-disciplinary discharge planning process, led by the attending physician.  Recommendations may be updated based on patient status, additional functional criteria and insurance authorization.   Assistance Recommended at Discharge Frequent or constant Supervision/Assistance  Patient can return home with the following Two people to help with walking and/or transfers;A lot of  help with bathing/dressing/bathroom;Assist for transportation;Assistance with cooking/housework;Help with stairs or ramp for entrance;Direct supervision/assist for financial management;Direct supervision/assist for medications management    Functional Status Assessment  Patient has had a recent decline in their functional status and demonstrates the ability to make significant improvements in function in a reasonable and predictable amount of time.  Equipment Recommendations  Other (comment) (defer to next venue of care)    Recommendations for Other Services       Precautions / Restrictions Precautions Precautions: Fall Required Braces or Orthoses: Splint/Cast Splint/Cast - Date Prophylactic Dressing Applied (if applicable): [id number] Restrictions Weight Bearing Restrictions: Yes LLE Weight Bearing: Non weight bearing      Mobility Bed Mobility Overal bed mobility: Needs Assistance             General bed mobility comments: Pt required Max A to initiate BLE movement. Pt demonstrated difficulty maintaining alertness (keeping eye closed t/o) and following one step commands therefore deferred further EOB mobility due to safety.    Transfers         General transfer comment: Unsafe to attempt at this time secondary to cognitive deficits      Balance       Sitting balance - Comments: unable to assess         ADL either performed or assessed with clinical judgement   ADL Overall ADL's : Needs assistance/impaired     Grooming: Set up;Bed level;Wash/dry face Grooming Details (indicate cue type and reason): washcloth placed on R hand, VC to initiate         Upper Body Dressing : Bed level;Maximal assistance Upper Body Dressing Details (indicate cue type and reason): to don/doff gown  Lower Body Dressing: Total  assistance;Bed level Lower Body Dressing Details (indicate cue type and reason): socks             Vision Patient Visual Report: No change from  baseline       Perception     Praxis      Pertinent Vitals/Pain Pain Assessment Pain Assessment: PAINAD Breathing: normal Negative Vocalization: none Facial Expression: smiling or inexpressive Body Language: relaxed Consolability: no need to console PAINAD Score: 0     Hand Dominance Right   Extremity/Trunk Assessment Upper Extremity Assessment Upper Extremity Assessment: Generalized weakness   Lower Extremity Assessment Lower Extremity Assessment: Generalized weakness LLE: Unable to fully assess due to pain;Unable to fully assess due to immobilization       Communication Communication Communication: HOH   Cognition Arousal/Alertness: Lethargic Behavior During Therapy: Flat affect Overall Cognitive Status: Impaired/Different from baseline Area of Impairment: Orientation, Memory, Attention, Following commands, Safety/judgement, Awareness                 Orientation Level: Disoriented to, Place, Time, Situation Current Attention Level: Focused Memory: Decreased recall of precautions, Decreased short-term memory Following Commands: Follows one step commands inconsistently Safety/Judgement: Decreased awareness of safety, Decreased awareness of deficits Awareness: Intellectual   General Comments: Per spouse pt with some STM issues at baseline but typically alert and oriented and able to follow all commands     General Comments       Exercises Other Exercises Other Exercises: OT provided education re: role of OT, OT POC, post acute recs, sitting up for all meals, EOB/OOB mobility with assistance, home/fall safety.     Shoulder Instructions      Home Living Family/patient expects to be discharged to:: Private residence Living Arrangements: Spouse/significant other Available Help at Discharge: Family;Available 24 hours/day Type of Home: House Home Access: Level entry     Home Layout: One level     Bathroom Shower/Tub: Radiographer, therapeutic: Handicapped height     Home Equipment: Conservation officer, nature (2 wheels);BSC/3in1;Rollator (4 wheels)   Additional Comments: History obtained from spouse at bedside secondary to pt confusion      Prior Functioning/Environment Prior Level of Function : Independent/Modified Independent;History of Falls (last six months)             Mobility Comments: Mod ind amb limited community distances with a rollator with occasional one or two SPC use in the community, 12-15 falls in the last 6 months secondary to tripping/LOB ADLs Comments: Ind with ADLs, occasional assist with very small buttons, wife assists with IADLs (driving)        OT Problem List: Decreased strength;Decreased activity tolerance;Impaired balance (sitting and/or standing);Decreased cognition;Decreased safety awareness;Decreased knowledge of use of DME or AE;Decreased knowledge of precautions;Obesity;Decreased range of motion      OT Treatment/Interventions: Self-care/ADL training;Therapeutic exercise;Neuromuscular education;Energy conservation;DME and/or AE instruction;Manual therapy;Modalities;Balance training;Patient/family education;Visual/perceptual remediation/compensation;Cognitive remediation/compensation;Therapeutic activities;Splinting    OT Goals(Current goals can be found in the care plan section) Acute Rehab OT Goals OT Goal Formulation: Patient unable to participate in goal setting Time For Goal Achievement: 10/09/22 Potential to Achieve Goals: Fair   OT Frequency: Min 2X/week    Co-evaluation              AM-PAC OT "6 Clicks" Daily Activity     Outcome Measure Help from another person eating meals?: A Lot Help from another person taking care of personal grooming?: A Lot Help from another person toileting, which includes using toliet, bedpan, or urinal?:  Total Help from another person bathing (including washing, rinsing, drying)?: Total Help from another person to put on and taking off regular  upper body clothing?: A Lot Help from another person to put on and taking off regular lower body clothing?: Total 6 Click Score: 9   End of Session Nurse Communication: Mobility status  Activity Tolerance: Patient limited by lethargy;Other (comment) (cognitionj) Patient left: in bed;with call bell/phone within reach;with bed alarm set;with family/visitor present  OT Visit Diagnosis: Muscle weakness (generalized) (M62.81);History of falling (Z91.81);Other abnormalities of gait and mobility (R26.89)                Time: [id number] OT Time Calculation (min): 15 min Charges:  OT General Charges $OT Visit: 1 Visit OT Evaluation $OT Eval Moderate Complexity: 1 Mod  [facility] MS, OTR/L ascom [phone]  09/25/22, 6:34 PM

## 2022-09-25 NOTE — Progress Notes (Addendum)
Progress Note    Thomas Mcdaniel  [id number] DOB: Oct 29, 1945  DOA: 09/23/2022 PCP: Valera Castle, MD      Brief Narrative:    Medical records reviewed and are as summarized below:  Thomas Mcdaniel is a 77 y.o. male medical history significant for hypertension, overactive bladder, mild dementia, vitamin B12 deficiency, peripheral neuropathy, frequent falls, who presented to the emergency department after a ground-level fall.  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  He was found to have left trimalleolar left ankle fracture with dislocation.     Assessment/Plan:   Principal Problem:   Ankle fracture Active Problems:   Falls frequently   Benign prostatic hyperplasia without lower urinary tract symptoms   Essential hypertension   Obstructive sleep apnea syndrome   Prediabetes   Delirium    Body mass index is 36.14 kg/m.  (Obesity)   Left trimalleolar left ankle fracture with dislocation, s/p mechanical fall with history of frequent falls: S/p ORIF of left trimalleolar ankle fracture on 09/24/2022.  He is to remain nonweightbearing on the left lower extremity.  Use analgesics as needed for pain.  PT recommended discharge to SNF.   Delirium with agitation, underlying mild dementia: Continue supportive care.  Use Haldol as needed for agitation/combativeness   Vitamin B12 deficiency: Vitamin B12 was 156 on 06/07/2022.  Vitamin B12 improved to 343 on his admission.  Continue oral vitamin B12 supplement.  His wife said he takes vitamin B12 injections in the outpatient setting.   Thrombocytopenia: Repeat CBC tomorrow..  This may be chronic.  Platelet count was  143,000 in June 2023   Mildly elevated creatinine: Improved.   The comorbidities include prediabetes, OSA (not on CPAP), hypertension, BPH   Diet Order             Diet Heart Room service appropriate? Yes with Assist; Fluid consistency: Thin  Diet  effective now                            Consultants: Podiatrist  Procedures: S/p ORIF left ankle fracture    Medications:    vitamin B-12  1,000 mcg Oral Daily   enoxaparin (LOVENOX) injection  0.5 mg/kg Subcutaneous Q24H   finasteride  5 mg Oral Daily   losartan  100 mg Oral QHS   memantine  5 mg Oral BID   mirabegron ER  50 mg Oral Daily   mirtazapine  15 mg Oral QHS   rosuvastatin  5 mg Oral Daily   sodium chloride flush  3 mL Intravenous Q12H   spironolactone  25 mg Oral QHS   tamsulosin  0.4 mg Oral Daily   Continuous Infusions:     Anti-infectives (From admission, onward)    Start     Dose/Rate Route Frequency Ordered Stop   09/24/22 0600  ceFAZolin (ANCEF) IVPB 3g/100 mL premix  Status:  Discontinued        3 g 200 mL/hr over 30 Minutes Intravenous On call to O.R. 09/23/22 1621 09/24/22 1707              Family Communication/Anticipated D/C date and plan/Code Status   DVT prophylaxis: SCDs Start: 09/23/22 1504     Code Status: Full Code  Family Communication: Plan discussed with his wife at the bedside Disposition Plan: Plan to discharge home in 2 to 3 days   Status is: Inpatient  Remains inpatient appropriate because: S/p ORIF left ankle fracture         Subjective:   Interval events noted.  Patient became very agitated and combative yesterday afternoon prior to surgery.  He is sedated/sleepy this morning and unable to provide any history.  His wife is at the bedside.  His wife said patient has mild dementia at baseline.  Objective:    Vitals:   09/24/22 1630 09/24/22 1744 09/25/22 0507 09/25/22 1600  BP: 109/75 117/73 (!) 144/68 (!) 146/81  Pulse: 85 82 93 86  Resp: 11 14 18 17   Temp: (!) 97.2 F (36.2 C) (!) 97.1 F (36.2 C) 98.4 F (36.9 C) (!) 97.3 F (36.3 C)  TempSrc:  Oral Axillary   SpO2: 97% 97% 97% 94%  Weight:      Height:       No data found.   Intake/Output Summary (Last 24 hours) at  09/25/2022 1712 Last data filed at 09/25/2022 0507 Gross per 24 hour  Intake 0 ml  Output 1200 ml  Net -1200 ml   Filed Weights   09/23/22 1408 09/24/22 1232  Weight: (!) 156.9 kg 111 kg    Exam:   GEN: NAD SKIN: Warm and dry EYES: No acute abnormality ENT: MMM CV: RRR PULM: CTA B ABD: soft, obese, NT, +BS CNS: Sleepy/sedated but arousable EXT: Splint on the left leg and left foot      Data Reviewed:   I have personally reviewed following labs and imaging studies:  Labs: Labs show the following:   Basic Metabolic Panel: Recent Labs  Lab 09/23/22 1407 09/24/22 0637 09/25/22 1558  NA 137 138 138  K 3.9 3.5 3.7  CL 107 106 103  CO2 23 25 26   GLUCOSE 129* 116* 128*  BUN 22 22 18   CREATININE 1.08 1.25* 0.92  CALCIUM 9.4 9.1 9.4   GFR Estimated Creatinine Clearance: 85.2 mL/min (by C-G formula based on SCr of 0.92 mg/dL). Liver Function Tests: Recent Labs  Lab 09/23/22 1407  AST 35  ALT 24  ALKPHOS 54  BILITOT 0.8  PROT 7.0  ALBUMIN 4.0   No results for input(s): "LIPASE", "AMYLASE" in the last 168 hours. No results for input(s): "AMMONIA" in the last 168 hours. Coagulation profile No results for input(s): "INR", "PROTIME" in the last 168 hours.  CBC: Recent Labs  Lab 09/23/22 1407 09/24/22 0637 09/25/22 0521  WBC 7.5 7.0 9.9  NEUTROABS 5.1  --  8.6*  HGB 14.3 13.4 14.5  HCT 42.0 39.7 44.9  MCV 90.9 91.9 95.3  PLT 138* 128* 107*   Cardiac Enzymes: No results for input(s): "CKTOTAL", "CKMB", "CKMBINDEX", "TROPONINI" in the last 168 hours. BNP (last 3 results) No results for input(s): "PROBNP" in the last 8760 hours. CBG: No results for input(s): "GLUCAP" in the last 168 hours. D-Dimer: No results for input(s): "DDIMER" in the last 72 hours. Hgb A1c: No results for input(s): "HGBA1C" in the last 72 hours. Lipid Profile: No results for input(s): "CHOL", "HDL", "LDLCALC", "TRIG", "CHOLHDL", "LDLDIRECT" in the last 72 hours. Thyroid  function studies: No results for input(s): "TSH", "T4TOTAL", "T3FREE", "THYROIDAB" in the last 72 hours.  Invalid input(s): "FREET3" Anemia work up: Recent Labs    09/23/22 1706  VITAMINB12 343   Sepsis Labs: Recent Labs  Lab 09/23/22 1407 09/24/22 0637 09/25/22 0521  WBC 7.5 7.0 9.9    Microbiology Recent Results (from the past 240 hour(s))  Microscopic Examination     Status: None  Collection Time: 09/20/22  9:23 AM   Urine  Result Value Ref Range Status   WBC, UA 0-5 0 - 5 /hpf Final   RBC, Urine 0-2 0 - 2 /hpf Final   Epithelial Cells (non renal) 0-10 0 - 10 /hpf Final   Bacteria, UA Few None seen/Few Final  CULTURE, URINE COMPREHENSIVE     Status: Abnormal   Collection Time: 09/20/22  9:59 AM   Specimen: Urine   UR  Result Value Ref Range Status   Urine Culture, Comprehensive Final report (A)  Final   Organism ID, Bacteria Enterococcus faecalis (A)  Final    Comment: For Enterococcus species, aminoglycosides (except for high-level resistance screening), cephalosporins, clindamycin, and trimethoprim-sulfamethoxazole are not effective clinically. (CLSI, M100-S26, 2016) 10,000-25,000 colony forming units per mL    ANTIMICROBIAL SUSCEPTIBILITY Comment  Final    Comment:       ** S = Susceptible; I = Intermediate; R = Resistant **                    P = Positive; N = Negative             MICS are expressed in micrograms per mL    Antibiotic                 RSLT#1    RSLT#2    RSLT#3    RSLT#4 Ciprofloxacin                  R Levofloxacin                   R Nitrofurantoin                 S Penicillin                     S Tetracycline                   R Vancomycin                     S     Procedures and diagnostic studies:  DG Ankle 2 Views Left  Result Date: 09/24/2022 CLINICAL DATA:  Elective surgery. Open reduction internal fixation of ankle fracture. EXAM: LEFT ANKLE - 2 VIEW COMPARISON:  Left ankle radiographs 09/23/2022 and CT left ankle 09/23/2022  FINDINGS: Images were performed intraoperatively without the presence of a radiologist. The patient is undergoing distal fibular lateral plate and screw fixation of the previously seen distal diaphyseal comminuted fracture. Associated tibiofibular screw fixation. Two new screws are seen traversing the medial malleolus. Total fluoroscopy images: 11 Total fluoroscopy time: 116 seconds Total dose: Radiation Exposure Index (as provided by the fluoroscopic device): 6.13 mGy air Kerma Please see intraoperative findings for further detail. IMPRESSION: Fluoroscopic guidance provided for open reduction internal fixation of distal fibular and medial malleolar fractures. Electronically Signed   By: Yvonne Kendall M.D.   On: 09/24/2022 17:21   DG C-Arm 1-60 Min-No Report  Result Date: 09/24/2022 Fluoroscopy was utilized by the requesting physician.  No radiographic interpretation.   DG C-Arm 1-60 Min-No Report  Result Date: 09/24/2022 Fluoroscopy was utilized by the requesting physician.  No radiographic interpretation.   DG C-Arm 1-60 Min-No Report  Result Date: 09/24/2022 Fluoroscopy was utilized by the requesting physician.  No radiographic interpretation.   [place] OR NERVE BLOCK-IMAGE ONLY [facility])  Result Date: 09/24/2022 There is no interpretation for this exam.  This order is for images obtained during a surgical procedure.  Please See "Surgeries" Tab for more information regarding the procedure.   [place] OR NERVE BLOCK-IMAGE ONLY [facility])  Result Date: 09/24/2022 There is no interpretation for this exam.  This order is for images obtained during a surgical procedure.  Please See "Surgeries" Tab for more information regarding the procedure.               LOS: 1 day   Armando Bukhari  [facility]   Pager on [email or web address]. If 7PM-7AM, please contact night-coverage at [email or web address]     09/25/2022, 5:12 PM

## 2022-09-26 ENCOUNTER — Telehealth: Payer: Self-pay

## 2022-09-26 ENCOUNTER — Encounter: Payer: Self-pay | Admitting: Podiatry

## 2022-09-26 DIAGNOSIS — E876 Hypokalemia: Secondary | ICD-10-CM

## 2022-09-26 DIAGNOSIS — R8271 Bacteriuria: Secondary | ICD-10-CM

## 2022-09-26 DIAGNOSIS — F03918 Unspecified dementia, unspecified severity, with other behavioral disturbance: Secondary | ICD-10-CM | POA: Diagnosis not present

## 2022-09-26 DIAGNOSIS — S82892A Other fracture of left lower leg, initial encounter for closed fracture: Secondary | ICD-10-CM | POA: Diagnosis not present

## 2022-09-26 LAB — BASIC METABOLIC PANEL
Anion gap: 9 (ref 5–15)
BUN: 22 mg/dL (ref 8–23)
CO2: 23 mmol/L (ref 22–32)
Calcium: 9.4 mg/dL (ref 8.9–10.3)
Chloride: 105 mmol/L (ref 98–111)
Creatinine, Ser: 0.97 mg/dL (ref 0.61–1.24)
GFR, Estimated: >60 mL/min (ref >60)
Glucose, Bld: 116 mg/dL — ABNORMAL HIGH (ref 70–99)
Potassium: 3.4 mmol/L — ABNORMAL LOW (ref 3.5–5.1)
Sodium: 137 mmol/L (ref 135–145)

## 2022-09-26 LAB — CBC WITH DIFFERENTIAL/PLATELET
Abs Immature Granulocytes: 0.05 10*3/uL (ref 0.00–0.07)
Basophils Absolute: 0 10*3/uL (ref 0.0–0.1)
Basophils Relative: 0 %
Eosinophils Absolute: 0.2 10*3/uL (ref 0.0–0.5)
Eosinophils Relative: 2 %
HCT: 40.6 % (ref 39.0–52.0)
Hemoglobin: 13.6 g/dL (ref 13.0–17.0)
Immature Granulocytes: 1 %
Lymphocytes Relative: 12 %
Lymphs Abs: 1.2 10*3/uL (ref 0.7–4.0)
MCH: 30.9 pg (ref 26.0–34.0)
MCHC: 33.5 g/dL (ref 30.0–36.0)
MCV: 92.3 fL (ref 80.0–100.0)
Monocytes Absolute: 0.9 10*3/uL (ref 0.1–1.0)
Monocytes Relative: 9 %
Neutro Abs: 7.5 10*3/uL (ref 1.7–7.7)
Neutrophils Relative %: 76 %
Platelets: 131 10*3/uL — ABNORMAL LOW (ref 150–400)
RBC: 4.4 MIL/uL (ref 4.22–5.81)
RDW: 13.7 % (ref 11.5–15.5)
WBC: 9.9 10*3/uL (ref 4.0–10.5)
nRBC: 0 % (ref 0.0–0.2)

## 2022-09-26 MED ORDER — QUETIAPINE FUMARATE 25 MG PO TABS
25.0000 mg | ORAL_TABLET | Freq: Three times a day (TID) | ORAL | Status: DC | PRN
  Administered 2022-09-26 – 2022-09-27 (×2): 25 mg via ORAL
  Filled 2022-09-26 (×2): qty 1

## 2022-09-26 MED ORDER — NITROFURANTOIN MONOHYD MACRO 100 MG PO CAPS
100.0000 mg | ORAL_CAPSULE | Freq: Two times a day (BID) | ORAL | 0 refills | Status: AC
Start: 2022-09-26 — End: 2022-10-06

## 2022-09-26 MED ORDER — POTASSIUM CHLORIDE CRYS ER 20 MEQ PO TBCR
20.0000 meq | EXTENDED_RELEASE_TABLET | Freq: Once | ORAL | Status: AC
  Administered 2022-09-26: 20 meq via ORAL
  Filled 2022-09-26: qty 1

## 2022-09-26 NOTE — TOC Initial Note (Addendum)
Transition of Care [facility]) - Initial/Assessment Note    Patient Details  Name: Thomas Mcdaniel MRN: [id number] Date of Birth: 1945/11/05  [facility] CM/SW Contact:    Candie Chroman, LCSW Phone Number: 09/26/2022, 12:19 PM  Clinical Narrative:  CSW met with patient. Son at bedside. CSW introduced role and explained that PT recommendations would be discussed. Patient and son are not interested in SNF placement. They both stated he had a bad experience when he went to SNF a few years ago. They are agreeable to home health. Patient asked CSW to discuss agency preference with his wife. Called wife and provided update. No agency preference. [facility] will review. Patient has a RW, rollator, BSC, and shower chair at home. Sent secure chat to PT to see if they would recommend any other DME.           2:05 pm: [facility] has accepted referral for PT, OT, aide, SW. Wife is aware and agreeable.        Expected Discharge Plan: [place] Barriers to Discharge: Continued Medical Work up   Patient Goals and CMS Choice            Expected Discharge Plan and Services     Post Acute Care Choice: [place] arrangements for the past 2 months: [place]                                      Prior Living Arrangements/Services Living arrangements for the past 2 months: Single Family Home Lives with:: Spouse Patient language and need for interpreter reviewed:: Yes Do you feel safe going back to the place where you live?: Yes      Need for Family Participation in Patient Care: Yes (Comment) Care giver support system in place?: Yes (comment) Current home services: DME Criminal Activity/Legal Involvement Pertinent to Current Situation/Hospitalization: No - Comment as needed  Activities of Daily Living      Permission Sought/Granted Permission sought to share information with : Facility Art therapist granted  to share information with : Yes, Verbal Permission Granted  Share Information with NAME: Jakyri Karsh  Permission granted to share info w AGENCY: [place] granted to share info w Relationship: Spouse  Permission granted to share info w Contact Information: [phone]  Emotional Assessment Appearance:: Appears stated age Attitude/Demeanor/Rapport: Engaged Affect (typically observed): Appropriate, Calm Orientation: : Oriented to Self, Oriented to Place, Oriented to  Time, Oriented to Situation Alcohol / Substance Use: Not Applicable Psych Involvement: No (comment)  Admission diagnosis:  Ankle fracture [S82.899A] Closed trimalleolar fracture of left ankle, initial encounter [S82.852A] Fall, initial encounter [W19.XXXA] Patient Active Problem List   Diagnosis Date Noted   Delirium 09/25/2022   Ankle fracture 09/23/2022   Impaired glucose tolerance 09/23/2022   Falls frequently 09/19/2021   Bursitis of right elbow 10/22/2017   Bradycardia 08/12/2017   Bursitis of both shoulders 01/28/2017   Altered mental status 12/14/2015   Altered mental state 12/14/2015   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   Prediabetes 08/22/2015   Obstructive sleep apnea syndrome 03/16/2015   B12 deficiency 04/11/2014   Articular gout 04/11/2014   Benign prostatic hyperplasia without lower urinary tract symptoms 04/11/2014   Calculus of kidney 04/11/2014   Essential hypertension 04/11/2014   Hyperlipidemia 04/11/2014   Personal history of pulmonary embolism 04/11/2014  Bilateral foot-drop 03/25/2014   Spinal stenosis, lumbar region without neurogenic claudication 11/11/2012   GERD (gastroesophageal reflux disease) [id number]   Alcoholic polyneuropathy [id number]   PCP:  Valera Castle, MD Pharmacy:   [id number] - [place], [place] [address] [place] [address] Phone: [phone] Fax: [phone]     Social Determinants of Health  (SDOH) Social History: SDOH Screenings   Food Insecurity: No Food Insecurity (09/23/2022)  Housing: Low Risk  (09/23/2022)  Transportation Needs: No Transportation Needs (09/23/2022)  Utilities: Not At Risk (09/23/2022)  Tobacco Use: Medium Risk (09/26/2022)   SDOH Interventions:     Readmission Risk Interventions     No data to display

## 2022-09-26 NOTE — Telephone Encounter (Signed)
Called pt, wife answers. Spoke with wife per DPR. Informed her of the information below per [facility]. Wife gave verbal understanding and states that patient is currently admitted in the hospital secondary to fall. RX sent in.

## 2022-09-26 NOTE — Progress Notes (Signed)
Physical Therapy Treatment Patient Details Name: Thomas Mcdaniel MRN: [id number] DOB: [birth year] Today's Date: 09/26/2022   History of Present Illness Pt is a 77 y.o. male with PMH that includes hypertension, RLE drop foot, overactive bladder, B12 deficiency, neuropathy with frequent falls, HOH, dementia, bradycardia, and R TKA who presents to the ED after a ground-level fall.  Pt diagnosed with displaced left trimalleolar ankle fracture and is s/p ORIF, syndesmotic fixation ankle, and talus bone graft.    PT Comments    Pt remains impulsive with flat affect and disoriented to place and situation but was able to follow most 1-step commands with extra time and multi-modal cuing.  Pt required constant cuing and physical assistance to keep his LLE off of the floor during lateral scoot and sit to stand transfer training.  Pt required extensive +2 assist to come to standing and to prevent LOB while in standing.  Max standing tolerance was around 20 sec.  Pt will benefit from continued PT services upon discharge to safely address deficits listed in patient problem list for decreased caregiver assistance and eventual return to PLOF.     Recommendations for follow up therapy are one component of a multi-disciplinary discharge planning process, led by the attending physician.  Recommendations may be updated based on patient status, additional functional criteria and insurance authorization.  Follow Up Recommendations  Can patient physically be transported by private vehicle: No    Assistance Recommended at Discharge Frequent or constant Supervision/Assistance  Patient can return home with the following Two people to help with walking and/or transfers;A lot of help with bathing/dressing/bathroom;Assistance with cooking/housework;Direct supervision/assist for medications management;Assist for transportation   Equipment Recommendations  None recommended by PT    Recommendations for Other Services        Precautions / Restrictions Precautions Precautions: Fall Required Braces or Orthoses: Splint/Cast Splint/Cast - Date Prophylactic Dressing Applied (if applicable): [id number] Restrictions Weight Bearing Restrictions: Yes LLE Weight Bearing: Non weight bearing     Mobility  Bed Mobility Overal bed mobility: Needs Assistance Bed Mobility: Supine to Sit, Sit to Supine     Supine to sit: Mod assist Sit to supine: Mod assist   General bed mobility comments: Mod A for BLE and trunk control    Transfers Overall transfer level: Needs assistance Equipment used: Rolling walker (2 wheels) Transfers: Bed to chair/wheelchair/BSC, Sit to/from Stand Sit to Stand: Max assist, +2 physical assistance, +2 safety/equipment          Lateral/Scoot Transfers: Mod assist General transfer comment: lateral scooting completed to R side, max multimodal cues for sequencing and mod A required, physical assist provided to keep L foot elevated from the floor; sit to/from stand from elevated surface with physical assist provided to keep L foot elevated from the floor and +2 max A to come to standing    Ambulation/Gait               General Gait Details: Unable   Stairs             Wheelchair Mobility    Modified Rankin (Stroke Patients Only)       Balance Overall balance assessment: Needs assistance   Sitting balance-Leahy Scale: Fair     Standing balance support: Bilateral upper extremity supported, Reliant on assistive device for balance Standing balance-Leahy Scale: Poor Standing balance comment: Unable/unsafe to attempt  Cognition Arousal/Alertness: Awake/alert Behavior During Therapy: Flat affect, Impulsive Overall Cognitive Status: Impaired/Different from baseline                         Following Commands: Follows one step commands inconsistently       General Comments: Per spouse pt with some STM issues at baseline  but typically alert and oriented and able to follow all commands        Exercises Total Joint Exercises Quad Sets: Strengthening, Both, 10 reps Hip ABduction/ADduction: AAROM, Strengthening, Both, 10 reps Straight Leg Raises: AAROM, Strengthening, Both, 10 reps Long Arc Quad: AROM, Strengthening, Both, 10 reps Knee Flexion: AROM, Strengthening, Both, 10 reps    General Comments        Pertinent Vitals/Pain Pain Assessment Pain Assessment: No/denies pain    Home Living                          Prior Function            PT Goals (current goals can now be found in the care plan section) Progress towards PT goals: Progressing toward goals    Frequency    BID      PT Plan Current plan remains appropriate    Co-evaluation PT/OT/SLP Co-Evaluation/Treatment: Yes Reason for Co-Treatment: Necessary to address cognition/behavior during functional activity;For patient/therapist safety PT goals addressed during session: Mobility/safety with mobility;Balance;Strengthening/ROM        AM-PAC PT "6 Clicks" Mobility   Outcome Measure  Help needed turning from your back to your side while in a flat bed without using bedrails?: A Lot Help needed moving from lying on your back to sitting on the side of a flat bed without using bedrails?: A Lot Help needed moving to and from a bed to a chair (including a wheelchair)?: Total Help needed standing up from a chair using your arms (e.g., wheelchair or bedside chair)?: Total Help needed to walk in hospital room?: Total Help needed climbing 3-5 steps with a railing? : Total 6 Click Score: 8    End of Session Equipment Utilized During Treatment: Gait belt Activity Tolerance: Patient tolerated treatment well Patient left: in bed;with call bell/phone within reach;with bed alarm set;with family/visitor present Nurse Communication: Mobility status;Weight bearing status PT Visit Diagnosis: History of falling  (Z91.81);Unsteadiness on feet (R26.81);Other abnormalities of gait and mobility (R26.89);Muscle weakness (generalized) (M62.81)     Time: [id number] PT Time Calculation (min) (ACUTE ONLY): 23 min  Charges:  $Therapeutic Exercise: 8-22 mins $Therapeutic Activity: 8-22 mins                     D. Scott Anhthu Perdew PT, DPT 09/26/22, 2:56 PM

## 2022-09-26 NOTE — Progress Notes (Signed)
Physical Therapy Treatment Patient Details Name: Thomas Mcdaniel MRN: [id number] DOB: August 28, 1945 Today's Date: 09/26/2022   History of Present Illness Pt is a 77 y.o. male with PMH that includes hypertension, RLE drop foot, overactive bladder, B12 deficiency, neuropathy with frequent falls, HOH, dementia, bradycardia, and R TKA who presents to the ED after a ground-level fall.  Pt diagnosed with displaced left trimalleolar ankle fracture and is s/p ORIF, syndesmotic fixation ankle, and talus bone graft.    PT Comments    Pt remained somewhat confused unsure of were he was or why he was in the hospital but did grossly follow commands more consistently then during the prior session, although extensive cuing often still required.  Pt remained somewhat impulsive but also grossly improved from prior session.  Pt required mod A with bed mobility tasks and min A with lateral scoot transfer and partial sit to stands where he was able to just minimally clear the surface of the bed.  Max cuing and assistance holding L foot off of the floor required to ensure WB compliance.  Pt will benefit from continued PT services upon discharge to safely address deficits listed in patient problem list for decreased caregiver assistance and eventual return to PLOF.    Recommendations for follow up therapy are one component of a multi-disciplinary discharge planning process, led by the attending physician.  Recommendations may be updated based on patient status, additional functional criteria and insurance authorization.  Follow Up Recommendations  Can patient physically be transported by private vehicle: No    Assistance Recommended at Discharge Frequent or constant Supervision/Assistance  Patient can return home with the following Two people to help with walking and/or transfers;A lot of help with bathing/dressing/bathroom;Assistance with cooking/housework;Direct supervision/assist for medications management;Assist for  transportation   Equipment Recommendations  None recommended by PT    Recommendations for Other Services       Precautions / Restrictions Precautions Precautions: Fall Required Braces or Orthoses: Splint/Cast Splint/Cast - Date Prophylactic Dressing Applied (if applicable): [id number] Restrictions Weight Bearing Restrictions: Yes LLE Weight Bearing: Non weight bearing     Mobility  Bed Mobility Overal bed mobility: Needs Assistance Bed Mobility: Supine to Sit, Sit to Supine     Supine to sit: Mod assist Sit to supine: Mod assist   General bed mobility comments: Mod A for BLE and trunk control    Transfers Overall transfer level: Needs assistance   Transfers: Bed to chair/wheelchair/BSC            Lateral/Scoot Transfers: Min guard General transfer comment: Pt able to laterally scoot at the EOB around 8-12 inches with max cuing for sequencing and L foot held off of the floor to ensure WB compliance    Ambulation/Gait               General Gait Details: Unable   Stairs             Wheelchair Mobility    Modified Rankin (Stroke Patients Only)       Balance Overall balance assessment: Needs assistance   Sitting balance-Leahy Scale: Fair         Standing balance comment: Unable/unsafe to attempt                            Cognition Arousal/Alertness: Awake/alert Behavior During Therapy: Flat affect Overall Cognitive Status: Impaired/Different from baseline  Following Commands: Follows one step commands inconsistently       General Comments: Per spouse pt with some STM issues at baseline but typically alert and oriented and able to follow all commands        Exercises Total Joint Exercises Quad Sets: Strengthening, Both, 10 reps Hip ABduction/ADduction: AAROM, Strengthening, Both, 10 reps Straight Leg Raises: AAROM, Strengthening, Both, 10 reps Long Arc Quad: AROM, Strengthening,  Both, 10 reps Knee Flexion: AROM, Strengthening, Both, 10 reps    General Comments        Pertinent Vitals/Pain Pain Assessment Pain Assessment: No/denies pain    Home Living                          Prior Function            PT Goals (current goals can now be found in the care plan section) Progress towards PT goals: Progressing toward goals    Frequency    BID      PT Plan Current plan remains appropriate    Co-evaluation PT/OT/SLP Co-Evaluation/Treatment: Yes Reason for Co-Treatment: Necessary to address cognition/behavior during functional activity;For patient/therapist safety PT goals addressed during session: Mobility/safety with mobility;Balance;Strengthening/ROM        AM-PAC PT "6 Clicks" Mobility   Outcome Measure  Help needed turning from your back to your side while in a flat bed without using bedrails?: A Lot Help needed moving from lying on your back to sitting on the side of a flat bed without using bedrails?: A Lot Help needed moving to and from a bed to a chair (including a wheelchair)?: Total Help needed standing up from a chair using your arms (e.g., wheelchair or bedside chair)?: Total Help needed to walk in hospital room?: Total Help needed climbing 3-5 steps with a railing? : Total 6 Click Score: 8    End of Session Equipment Utilized During Treatment: Gait belt Activity Tolerance: Patient tolerated treatment well Patient left: in bed;with call bell/phone within reach;with bed alarm set Nurse Communication: Mobility status;Weight bearing status PT Visit Diagnosis: History of falling (Z91.81);Unsteadiness on feet (R26.81);Other abnormalities of gait and mobility (R26.89);Muscle weakness (generalized) (M62.81)     Time: [id number] PT Time Calculation (min) (ACUTE ONLY): 24 min  Charges:  $Therapeutic Exercise: 8-22 mins                    D. Scott Casady Voshell PT, DPT 09/26/22, 11:21 AM

## 2022-09-26 NOTE — Plan of Care (Signed)
  Problem: Clinical Measurements: Goal: Ability to maintain clinical measurements within normal limits will improve Outcome: Progressing Goal: Diagnostic test results will improve Outcome: Progressing Goal: Cardiovascular complication will be avoided Outcome: Progressing   Problem: Nutrition: Goal: Adequate nutrition will be maintained Outcome: Progressing   Problem: Pain Managment: Goal: General experience of comfort will improve Outcome: Progressing

## 2022-09-26 NOTE — Progress Notes (Signed)
PROGRESS NOTE    Thomas Mcdaniel  [id number] DOB: 1946/03/29 DOA: 09/23/2022 PCP: Valera Castle, MD    Assessment & Plan:   Principal Problem:   Ankle fracture Active Problems:   Falls frequently   Benign prostatic hyperplasia without lower urinary tract symptoms   Essential hypertension   Obstructive sleep apnea syndrome   Prediabetes   Delirium  Assessment and Plan:  Left trimalleolar left ankle fracture: with dislocation. Mechanical fall with history of frequent falls: S/p ORIF of left trimalleolar ankle fracture on 09/24/2022. Nonweightbearing on the left lower extremity. PT/OT recs SNF.   Delirium: with agitation. Hx of dementia. Continue w/ supportive care. Haldol, seroquel prn for agitation    Vitamin B12 deficiency: continue on B12 supplements   Hypokalemia: potassium given   Thrombocytopenia: possibly chronic. Labile. Will continue to monitor  AKI: resolved   Obesity: BMI 36.1. Would benefit from weight loss    DVT prophylaxis: lovenox  Code Status: full  Family Communication: discussed pt's care w/ pt's wife at bedside and answered her questions  Disposition Plan: PT/OT recs SNF   Level of care: Med-Surg  Status is: Inpatient Remains inpatient appropriate because: severity of illness    Consultants:  Podiatry   Procedures:   Antimicrobials:   Subjective: Pt c/o ankle pain   Objective: Vitals:   09/24/22 1744 09/25/22 0507 09/25/22 1600 09/25/22 2331  BP: 117/73 (!) 144/68 (!) 146/81 131/76  Pulse: 82 93 86 78  Resp: 14 18 17 18   Temp: (!) 97.1 F (36.2 C) 98.4 F (36.9 C) (!) 97.3 F (36.3 C) (!) 97.5 F (36.4 C)  TempSrc: Oral Axillary    SpO2: 97% 97% 94% 95%  Weight:      Height:       No intake or output data in the 24 hours ending 09/26/22 0828 Filed Weights   09/23/22 1408 09/24/22 1232  Weight: (!) 156.9 kg 111 kg    Examination:  General exam: Appears calm and comfortable  Respiratory system: Clear to  auscultation. Respiratory effort normal. Cardiovascular system: S1 & S2+. No rubs, gallops or clicks.  Gastrointestinal system: Abdomen is obese, soft and nontender.  Normal bowel sounds heard. Central nervous system: Alert and oriented to person & place only. Moves all extremities  Psychiatry: Judgement and insight appears poor. Mood & affect appropriate.     Data Reviewed: I have personally reviewed following labs and imaging studies  CBC: Recent Labs  Lab 09/23/22 1407 09/24/22 0637 09/25/22 0521 09/26/22 0453  WBC 7.5 7.0 9.9 9.9  NEUTROABS 5.1  --  8.6* 7.5  HGB 14.3 13.4 14.5 13.6  HCT 42.0 39.7 44.9 40.6  MCV 90.9 91.9 95.3 92.3  PLT 138* 128* 107* [id number]*   Basic Metabolic Panel: Recent Labs  Lab 09/23/22 1407 09/24/22 0637 09/25/22 1558 09/26/22 0453  NA 137 138 138 137  K 3.9 3.5 3.7 3.4*  CL 107 106 103 105  CO2 23 25 26 23   GLUCOSE 129* 116* 128* 116*  BUN 22 22 18 22   CREATININE 1.08 1.25* 0.92 0.97  CALCIUM 9.4 9.1 9.4 9.4   GFR: Estimated Creatinine Clearance: 80.8 mL/min (by C-G formula based on SCr of 0.97 mg/dL). Liver Function Tests: Recent Labs  Lab 09/23/22 1407  AST 35  ALT 24  ALKPHOS 54  BILITOT 0.8  PROT 7.0  ALBUMIN 4.0   No results for input(s): "LIPASE", "AMYLASE" in the last 168 hours. No results for input(s): "AMMONIA" in the last 168  hours. Coagulation Profile: No results for input(s): "INR", "PROTIME" in the last 168 hours. Cardiac Enzymes: No results for input(s): "CKTOTAL", "CKMB", "CKMBINDEX", "TROPONINI" in the last 168 hours. BNP (last 3 results) No results for input(s): "PROBNP" in the last 8760 hours. HbA1C: No results for input(s): "HGBA1C" in the last 72 hours. CBG: No results for input(s): "GLUCAP" in the last 168 hours. Lipid Profile: No results for input(s): "CHOL", "HDL", "LDLCALC", "TRIG", "CHOLHDL", "LDLDIRECT" in the last 72 hours. Thyroid Function Tests: No results for input(s): "TSH", "T4TOTAL",  "FREET4", "T3FREE", "THYROIDAB" in the last 72 hours. Anemia Panel: Recent Labs    09/23/22 1706  VITAMINB12 343   Sepsis Labs: No results for input(s): "PROCALCITON", "LATICACIDVEN" in the last 168 hours.  Recent Results (from the past 240 hour(s))  Microscopic Examination     Status: None   Collection Time: 09/20/22  9:23 AM   Urine  Result Value Ref Range Status   WBC, UA 0-5 0 - 5 /hpf Final   RBC, Urine 0-2 0 - 2 /hpf Final   Epithelial Cells (non renal) 0-10 0 - 10 /hpf Final   Bacteria, UA Few None seen/Few Final  CULTURE, URINE COMPREHENSIVE     Status: Abnormal   Collection Time: 09/20/22  9:59 AM   Specimen: Urine   UR  Result Value Ref Range Status   Urine Culture, Comprehensive Final report (A)  Final   Organism ID, Bacteria Enterococcus faecalis (A)  Final    Comment: For Enterococcus species, aminoglycosides (except for high-level resistance screening), cephalosporins, clindamycin, and trimethoprim-sulfamethoxazole are not effective clinically. (CLSI, M100-S26, 2016) 10,000-25,000 colony forming units per mL    ANTIMICROBIAL SUSCEPTIBILITY Comment  Final    Comment:       ** S = Susceptible; I = Intermediate; R = Resistant **                    P = Positive; N = Negative             MICS are expressed in micrograms per mL    Antibiotic                 RSLT#1    RSLT#2    RSLT#3    RSLT#4 Ciprofloxacin                  R Levofloxacin                   R Nitrofurantoin                 S Penicillin                     S Tetracycline                   R Vancomycin                     S          Radiology Studies: DG Ankle 2 Views Left  Result Date: 09/24/2022 CLINICAL DATA:  Elective surgery. Open reduction internal fixation of ankle fracture. EXAM: LEFT ANKLE - 2 VIEW COMPARISON:  Left ankle radiographs 09/23/2022 and CT left ankle 09/23/2022 FINDINGS: Images were performed intraoperatively without the presence of a radiologist. The patient is undergoing  distal fibular lateral plate and screw fixation of the previously seen distal diaphyseal comminuted fracture. Associated tibiofibular screw fixation. Two new screws are seen traversing the  medial malleolus. Total fluoroscopy images: 11 Total fluoroscopy time: 116 seconds Total dose: Radiation Exposure Index (as provided by the fluoroscopic device): 6.13 mGy air Kerma Please see intraoperative findings for further detail. IMPRESSION: Fluoroscopic guidance provided for open reduction internal fixation of distal fibular and medial malleolar fractures. Electronically Signed   By: Yvonne Kendall M.D.   On: 09/24/2022 17:21   DG C-Arm 1-60 Min-No Report  Result Date: 09/24/2022 Fluoroscopy was utilized by the requesting physician.  No radiographic interpretation.   DG C-Arm 1-60 Min-No Report  Result Date: 09/24/2022 Fluoroscopy was utilized by the requesting physician.  No radiographic interpretation.   DG C-Arm 1-60 Min-No Report  Result Date: 09/24/2022 Fluoroscopy was utilized by the requesting physician.  No radiographic interpretation.   [place] OR NERVE BLOCK-IMAGE ONLY [facility])  Result Date: 09/24/2022 There is no interpretation for this exam.  This order is for images obtained during a surgical procedure.  Please See "Surgeries" Tab for more information regarding the procedure.   [place] OR NERVE BLOCK-IMAGE ONLY [facility])  Result Date: 09/24/2022 There is no interpretation for this exam.  This order is for images obtained during a surgical procedure.  Please See "Surgeries" Tab for more information regarding the procedure.        Scheduled Meds:  vitamin B-12  1,000 mcg Oral Daily   enoxaparin (LOVENOX) injection  0.5 mg/kg Subcutaneous Q24H   finasteride  5 mg Oral Daily   losartan  100 mg Oral QHS   memantine  5 mg Oral BID   mirabegron ER  50 mg Oral Daily   mirtazapine  15 mg Oral QHS   rosuvastatin  5 mg Oral Daily   sodium chloride flush  3 mL Intravenous Q12H   spironolactone  25 mg Oral  QHS   tamsulosin  0.4 mg Oral Daily   Continuous Infusions:   LOS: 2 days    Time spent: 30 mins     Wyvonnia Dusky, MD [facility] Pager [phone]  If 7PM-7AM, please contact night-coverage [email or web address] 09/26/2022, 8:28 AM

## 2022-09-26 NOTE — Telephone Encounter (Signed)
-----   Message from Billey Co, MD sent at 09/26/2022  8:21 AM EDT ----- His urine culture did ultimately grow a small amount of bacteria, with his long-term symptoms I do think is reasonable to try nitrofurantoin 100 mg twice daily x 10 days in addition to the strategies we discussed in clinic  Nickolas Madrid, MD 09/26/2022

## 2022-09-26 NOTE — Plan of Care (Signed)
  Problem: Safety: Goal: Violent Restraint(s) Outcome: Completed/Met   

## 2022-09-26 NOTE — Progress Notes (Signed)
Occupational Therapy Treatment Patient Details Name: Thomas Mcdaniel MRN: [id number] DOB: 12/18/1945 Today's Date: 09/26/2022   History of present illness Pt is a 77 y.o. male with PMH that includes hypertension, RLE drop foot, overactive bladder, B12 deficiency, neuropathy with frequent falls, HOH, dementia, bradycardia, and R TKA who presents to the ED after a ground-level fall.  Pt diagnosed with displaced left trimalleolar ankle fracture and is s/p ORIF, syndesmotic fixation ankle, and talus bone graft.   OT comments  Patient received supine in bed and agreeable to OT/PT co-treatment to maximize safety and participation. Pt continues to experience confusion although with improved alertness and ability to follow one step commands as compared to last session. Pt required multimodal cues for sequencing and to maintain LLE NWB during mobility. He required Mod A for bed mobility and Min guard for simulated toilet transfer via lateral scooting along EOB toward R side. Pt left sitting up in bed with set up A provided for self-feeding. PT present in room. Pt is making progress toward goal completion. D/C recommendation remains appropriate. OT will continue to follow acutely.    Recommendations for follow up therapy are one component of a multi-disciplinary discharge planning process, led by the attending physician.  Recommendations may be updated based on patient status, additional functional criteria and insurance authorization.    Assistance Recommended at Discharge Frequent or constant Supervision/Assistance  Patient can return home with the following  Two people to help with walking and/or transfers;A lot of help with bathing/dressing/bathroom;Assist for transportation;Assistance with cooking/housework;Help with stairs or ramp for entrance;Direct supervision/assist for financial management;Direct supervision/assist for medications management   Equipment Recommendations  Other (comment) (defer to next  venue of care)    Recommendations for Other Services      Precautions / Restrictions Precautions Precautions: Fall Required Braces or Orthoses: Splint/Cast Splint/Cast - Date Prophylactic Dressing Applied (if applicable): [id number] Restrictions Weight Bearing Restrictions: Yes LLE Weight Bearing: Non weight bearing       Mobility Bed Mobility Overal bed mobility: Needs Assistance Bed Mobility: Supine to Sit, Sit to Supine Rolling: Mod assist   Supine to sit: Mod assist Sit to supine: Mod assist        Transfers Overall transfer level: Needs assistance Equipment used: None Transfers: Bed to chair/wheelchair/BSC            Lateral/Scoot Transfers: Min guard General transfer comment: lateral scooting completed to R side, Max multimodal cues for sequencing, VC to maintain NWB     Balance Overall balance assessment: Needs assistance   Sitting balance-Leahy Scale: Fair           ADL either performed or assessed with clinical judgement   ADL Overall ADL's : Needs assistance/impaired     Grooming: Set up;Bed level;Wash/dry face Grooming Details (indicate cue type and reason): VC to initiate             Lower Body Dressing: Maximal assistance;Sitting/lateral leans Lower Body Dressing Details (indicate cue type and reason): R sock  Toilet Transfer: Min guard;Cueing for sequencing;Cueing for safety Toilet Transfer Details (indicate cue type and reason): simulated via lateral scoot transfer along EOB toward R side                Extremity/Trunk Assessment Upper Extremity Assessment Upper Extremity Assessment: Generalized weakness   Lower Extremity Assessment Lower Extremity Assessment: Generalized weakness        Vision Patient Visual Report: No change from baseline     Perception  Praxis      Cognition Arousal/Alertness: Awake/alert Behavior During Therapy: Agitated, Flat affect Overall Cognitive Status: Impaired/Different from  baseline Area of Impairment: Orientation, Memory, Attention, Following commands, Safety/judgement, Awareness, Problem solving                 Orientation Level: Disoriented to, Place, Time, Situation Current Attention Level: Focused Memory: Decreased recall of precautions, Decreased short-term memory Following Commands: Follows one step commands inconsistently Safety/Judgement: Decreased awareness of safety, Decreased awareness of deficits Awareness: Intellectual Problem Solving: Requires verbal cues, Requires tactile cues, Slow processing General Comments: Improved alertness and command following this date although inconsistent still at times. Oriented to self and "hospital" when given two choices. Slightly agitated at times describing something that happened last night with NSG. Poor safety awareness and situational awareness.        Exercises Other Exercises Other Exercises: RLE NWB, safety with EOB/OOB mobility    Shoulder Instructions       General Comments      Pertinent Vitals/ Pain       Pain Assessment Pain Assessment: No/denies pain  Home Living            Prior Functioning/Environment              Frequency  Min 1X/week        Progress Toward Goals  OT Goals(current goals can now be found in the care plan section)  Progress towards OT goals: Progressing toward goals  Acute Rehab OT Goals OT Goal Formulation: Patient unable to participate in goal setting Time For Goal Achievement: 10/09/22 Potential to Achieve Goals: [place] Discharge plan remains appropriate;Frequency needs to be updated    Co-evaluation    PT/OT/SLP Co-Evaluation/Treatment: Yes Reason for Co-Treatment: Necessary to address cognition/behavior during functional activity;For patient/therapist safety PT goals addressed during session: Mobility/safety with mobility;Balance;Strengthening/ROM        AM-PAC OT "6 Clicks" Daily Activity     Outcome Measure   Help  from another person eating meals?: A Little Help from another person taking care of personal grooming?: A Little Help from another person toileting, which includes using toliet, bedpan, or urinal?: Total Help from another person bathing (including washing, rinsing, drying)?: Total Help from another person to put on and taking off regular upper body clothing?: A Lot Help from another person to put on and taking off regular lower body clothing?: A Lot 6 Click Score: 12    End of Session Equipment Utilized During Treatment: Gait belt;Rolling walker (2 wheels)  OT Visit Diagnosis: Muscle weakness (generalized) (M62.81);History of falling (Z91.81);Other abnormalities of gait and mobility (R26.89)   Activity Tolerance Patient tolerated treatment well   Patient Left in bed;with call bell/phone within reach;with bed alarm set;with family/visitor present   Nurse Communication Mobility status        Time: [id number] OT Time Calculation (min): 20 min  Charges: OT General Charges $OT Visit: 1 Visit OT Treatments $Self Care/Home Management : 8-22 mins  [facility] MS, OTR/L ascom [phone]  09/26/22, 1:29 PM

## 2022-09-26 NOTE — Progress Notes (Signed)
Patient called and the [facility] went to his room to ask what he need, he start saying that he want to go home, the P[facility]or Curia explain to him that it is 2am and he can't go home yet. He start to hand me down the call light but his action is to grab me, the author keep the distance and called for help, patient start to throw the blankets and pillows. The security was called and the charge nurse gave the PRN meds for agitation.

## 2022-09-27 DIAGNOSIS — F03918 Unspecified dementia, unspecified severity, with other behavioral disturbance: Secondary | ICD-10-CM | POA: Diagnosis not present

## 2022-09-27 DIAGNOSIS — I10 Essential (primary) hypertension: Secondary | ICD-10-CM | POA: Diagnosis not present

## 2022-09-27 DIAGNOSIS — S82892A Other fracture of left lower leg, initial encounter for closed fracture: Secondary | ICD-10-CM | POA: Diagnosis not present

## 2022-09-27 DIAGNOSIS — E669 Obesity, unspecified: Secondary | ICD-10-CM

## 2022-09-27 LAB — BASIC METABOLIC PANEL
Anion gap: 10 (ref 5–15)
BUN: 23 mg/dL (ref 8–23)
CO2: 22 mmol/L (ref 22–32)
Calcium: 9.3 mg/dL (ref 8.9–10.3)
Chloride: 104 mmol/L (ref 98–111)
Creatinine, Ser: 0.86 mg/dL (ref 0.61–1.24)
GFR, Estimated: >60 mL/min (ref >60)
Glucose, Bld: 140 mg/dL — ABNORMAL HIGH (ref 70–99)
Potassium: 3.9 mmol/L (ref 3.5–5.1)
Sodium: 136 mmol/L (ref 135–145)

## 2022-09-27 LAB — CBC
HCT: 41 % (ref 39.0–52.0)
Hemoglobin: 14.2 g/dL (ref 13.0–17.0)
MCH: 31.1 pg (ref 26.0–34.0)
MCHC: 34.6 g/dL (ref 30.0–36.0)
MCV: 89.9 fL (ref 80.0–100.0)
Platelets: 150 10*3/uL (ref 150–400)
RBC: 4.56 MIL/uL (ref 4.22–5.81)
RDW: 13.5 % (ref 11.5–15.5)
WBC: 8.9 10*3/uL (ref 4.0–10.5)
nRBC: 0 % (ref 0.0–0.2)

## 2022-09-27 LAB — GLUCOSE, CAPILLARY: Glucose-Capillary: 164 mg/dL — ABNORMAL HIGH (ref 70–99)

## 2022-09-27 MED ORDER — HYDROCODONE-ACETAMINOPHEN 5-325 MG PO TABS: 1.0000 | ORAL_TABLET | Freq: Four times a day (QID) | ORAL | 0 refills | Status: AC | PRN

## 2022-09-27 NOTE — Progress Notes (Signed)
PROGRESS NOTE    LEA ASA  [id number] DOB: 12-01-1945 DOA: 09/23/2022 PCP: Valera Castle, MD    Assessment & Plan:   Principal Problem:   Ankle fracture Active Problems:   Falls frequently   Benign prostatic hyperplasia without lower urinary tract symptoms   Essential hypertension   Obstructive sleep apnea syndrome   Prediabetes   Delirium  Assessment and Plan:  Left trimalleolar left ankle fracture: with dislocation. Mechanical fall with history of frequent falls.S/p ORIF of left trimalleolar ankle fracture on 09/24/2022. Nonweightbearing of LLE. PT/OT recs SNF but pt refuses and agrees to home health. Pt had a unwitnessed fall this morning 09/27/22 and pt is unsure of how he fell. Vital signs were WNL. Bed alarm was placed.   Delirium: with agitation. Hx of dementia. Continue w/ supportive care. Seroquel, haldol prn for agitation   Vitamin B12 deficiency: continue on B12 supplements   Hypokalemia: WNL today    Thrombocytopenia: WNL today   AKI: resolved   Obesity: BMI 36.1. Would benefit from weight loss    DVT prophylaxis: lovenox  Code Status: full  Family Communication: discussed pt's care w/ pt's son and answered his questions  Disposition Plan: PT/OT recs SNF   Level of care: Med-Surg  Status is: Inpatient Remains inpatient appropriate because: severity of illness    Consultants:  Podiatry   Procedures:   Antimicrobials:   Subjective: Pt c/o foot pain.   Objective: Vitals:   09/26/22 1553 09/27/22 0719 09/27/22 0752 09/27/22 0815  BP: 125/66 123/78 (!) 87/58 95/64  Pulse: 100 (!) 108 81 80  Resp: 20 18 16    Temp: 98.9 F (37.2 C) 98.2 F (36.8 C) 98.5 F (36.9 C)   TempSrc: Oral Oral    SpO2: 93% 94% 95% 96%  Weight:      Height:        Intake/Output Summary (Last 24 hours) at 09/27/2022 0829 Last data filed at 09/26/2022 1712 Gross per 24 hour  Intake --  Output 650 ml  Net -650 ml   Filed Weights   09/23/22 1408  09/24/22 1232  Weight: (!) 156.9 kg 111 kg    Examination:  General exam: Appears calm but confused  Respiratory system: clear breath sounds b/l  Cardiovascular system: S1/S2+. No rubs or clicks  Gastrointestinal system: Abd is soft, NT, obese & hypoactive bowel sounds  Central nervous system: Oriented to person & place only.  Psychiatry: Judgement and insight appears poor. Flat mood and affect    Data Reviewed: I have personally reviewed following labs and imaging studies  CBC: Recent Labs  Lab 09/23/22 1407 09/24/22 0637 09/25/22 0521 09/26/22 0453 09/27/22 0507  WBC 7.5 7.0 9.9 9.9 8.9  NEUTROABS 5.1  --  8.6* 7.5  --   HGB 14.3 13.4 14.5 13.6 14.2  HCT 42.0 39.7 44.9 40.6 41.0  MCV 90.9 91.9 95.3 92.3 89.9  PLT 138* 128* 107* 131* [id number]   Basic Metabolic Panel: Recent Labs  Lab 09/23/22 1407 09/24/22 0637 09/25/22 1558 09/26/22 0453 09/27/22 0507  NA 137 138 138 137 136  K 3.9 3.5 3.7 3.4* 3.9  CL 107 106 103 105 104  CO2 23 25 26 23 22   GLUCOSE 129* 116* 128* 116* 140*  BUN 22 22 18 22 23   CREATININE 1.08 1.25* 0.92 0.97 0.86  CALCIUM 9.4 9.1 9.4 9.4 9.3   GFR: Estimated Creatinine Clearance: 91.1 mL/min (by C-G formula based on SCr of 0.86 mg/dL). Liver Function Tests: Recent  Labs  Lab 09/23/22 1407  AST 35  ALT 24  ALKPHOS 54  BILITOT 0.8  PROT 7.0  ALBUMIN 4.0   No results for input(s): "LIPASE", "AMYLASE" in the last 168 hours. No results for input(s): "AMMONIA" in the last 168 hours. Coagulation Profile: No results for input(s): "INR", "PROTIME" in the last 168 hours. Cardiac Enzymes: No results for input(s): "CKTOTAL", "CKMB", "CKMBINDEX", "TROPONINI" in the last 168 hours. BNP (last 3 results) No results for input(s): "PROBNP" in the last 8760 hours. HbA1C: No results for input(s): "HGBA1C" in the last 72 hours. CBG: Recent Labs  Lab 09/27/22 0716  GLUCAP 164*   Lipid Profile: No results for input(s): "CHOL", "HDL", "LDLCALC",  "TRIG", "CHOLHDL", "LDLDIRECT" in the last 72 hours. Thyroid Function Tests: No results for input(s): "TSH", "T4TOTAL", "FREET4", "T3FREE", "THYROIDAB" in the last 72 hours. Anemia Panel: No results for input(s): "VITAMINB12", "FOLATE", "FERRITIN", "TIBC", "IRON", "RETICCTPCT" in the last 72 hours.  Sepsis Labs: No results for input(s): "PROCALCITON", "LATICACIDVEN" in the last 168 hours.  Recent Results (from the past 240 hour(s))  Microscopic Examination     Status: None   Collection Time: 09/20/22  9:23 AM   Urine  Result Value Ref Range Status   WBC, UA 0-5 0 - 5 /hpf Final   RBC, Urine 0-2 0 - 2 /hpf Final   Epithelial Cells (non renal) 0-10 0 - 10 /hpf Final   Bacteria, UA Few None seen/Few Final  CULTURE, URINE COMPREHENSIVE     Status: Abnormal   Collection Time: 09/20/22  9:59 AM   Specimen: Urine   UR  Result Value Ref Range Status   Urine Culture, Comprehensive Final report (A)  Final   Organism ID, Bacteria Enterococcus faecalis (A)  Final    Comment: For Enterococcus species, aminoglycosides (except for high-level resistance screening), cephalosporins, clindamycin, and trimethoprim-sulfamethoxazole are not effective clinically. (CLSI, M100-S26, 2016) 10,000-25,000 colony forming units per mL    ANTIMICROBIAL SUSCEPTIBILITY Comment  Final    Comment:       ** S = Susceptible; I = Intermediate; R = Resistant **                    P = Positive; N = Negative             MICS are expressed in micrograms per mL    Antibiotic                 RSLT#1    RSLT#2    RSLT#3    RSLT#4 Ciprofloxacin                  R Levofloxacin                   R Nitrofurantoin                 S Penicillin                     S Tetracycline                   R Vancomycin                     S          Radiology Studies: No results found.      Scheduled Meds:  vitamin B-12  1,000 mcg Oral Daily   enoxaparin (LOVENOX) injection  0.5 mg/kg Subcutaneous Q24H  finasteride  5  mg Oral Daily   losartan  100 mg Oral QHS   memantine  5 mg Oral BID   mirabegron ER  50 mg Oral Daily   mirtazapine  15 mg Oral QHS   rosuvastatin  5 mg Oral Daily   sodium chloride flush  3 mL Intravenous Q12H   spironolactone  25 mg Oral QHS   tamsulosin  0.4 mg Oral Daily   Continuous Infusions:   LOS: 3 days    Time spent: 25 mins     Wyvonnia Dusky, MD [facility] Pager [phone]  If 7PM-7AM, please contact night-coverage [email or web address] 09/27/2022, 8:29 AM

## 2022-09-27 NOTE — TOC Progression Note (Addendum)
Transition of Care [facility]) - Progression Note    Patient Details  Name: Thomas Mcdaniel MRN: [id number] Date of Birth: 1945/12/16  Transition of Care [facility]) CM/SW [place], LCSW Phone Number: 09/27/2022, 11:12 AM  Clinical Narrative:  Ordered wheelchair through [employer]. They will deliver to the room. MD will discuss hoyer lift with wife. Wife confirmed they are planning to transport him home by car.   12:51 pm: Per MD, plan to discharge tomorrow and wife does not want hoyer lift. Left message for [place] liaison to notify.  Expected Discharge Plan: [place] Barriers to Discharge: Continued Medical Work up  Expected Discharge Plan and Services     Post Acute Care Choice: [place] arrangements for the past 2 months: Single Family Home                                       Social Determinants of Health (SDOH) Interventions SDOH Screenings   Food Insecurity: No Food Insecurity (09/23/2022)  Housing: Low Risk  (09/23/2022)  Transportation Needs: No Transportation Needs (09/23/2022)  Utilities: Not At Risk (09/23/2022)  Tobacco Use: Medium Risk (09/26/2022)    Readmission Risk Interventions     No data to display

## 2022-09-27 NOTE — Progress Notes (Addendum)
PT Cancellation Note  Patient Details Name: Thomas Mcdaniel MRN: [id number] DOB: 03-31-1946   Cancelled Treatment:    Reason Eval/Treat Not Completed: Patient declined, no reason specified (Attempted second PT session today. Patient declined, wants to stay in the bed. PT will continue with attempts as appropriate)  Minna Merritts, PT, MPT  Percell Locus 09/27/2022, 2:57 PM

## 2022-09-27 NOTE — Care Management Important Message (Signed)
Important Message  Patient Details  Name: ACHERON WALLACH MRN: [id number] Date of Birth: 1945/09/27   Medicare Important Message Given:  N/A - LOS <3 / Initial given by admissions     Dannette Barbara 09/27/2022, 12:59 PM

## 2022-09-27 NOTE — Plan of Care (Signed)

## 2022-09-27 NOTE — TOC Transition Note (Signed)
Transition of Care [facility]) - CM/SW Discharge Note   Patient Details  Name: Thomas Mcdaniel MRN: [id number] Date of Birth: [birth year]  Transition of Care [facility]) CM/SW Contact:  Candie Chroman, LCSW Phone Number: 09/27/2022, 4:30 PM   Clinical Narrative:  Patient has orders to discharge home today. [place] liaison is aware. RN confirmed wheelchair is in the room. No further concerns. CSW signing off.   Final next level of care: [place] Barriers to Discharge: Barriers Resolved   Patient Goals and CMS Choice   Choice offered to / list presented to : Spouse  Discharge Placement                  Patient to be transferred to facility by: Family Name of family member notified: Glenice Laine Patient and family notified of of transfer: 09/27/22  Discharge Plan and Services Additional resources added to the After Visit Summary for       Post Acute Care Choice: Home Health          DME Arranged: Lightweight manual wheelchair with seat cushion DME Agency: [employer] Date DME Agency Contacted: 09/27/22   Representative spoke with at DME Agency: [place] Arranged: PT, OT, Nurse's Aide, Social Work [employer] Agency: Microbiologist ([place]) Date [place]: 09/27/22   Representative spoke with at [place]: Floydene Flock  Social Determinants of Health (SDOH) Interventions SDOH Screenings   Food Insecurity: No Food Insecurity (09/23/2022)  Housing: Low Risk  (09/23/2022)  Transportation Needs: No Transportation Needs (09/23/2022)  Utilities: Not At Risk (09/23/2022)  Tobacco Use: Medium Risk (09/26/2022)     Readmission Risk Interventions     No data to display

## 2022-09-27 NOTE — Discharge Summary (Signed)
Physician Discharge Summary  Thomas Mcdaniel [id number] DOB: Jul 03, 1945 DOA: 09/23/2022  PCP: Valera Castle, MD  Admit date: 09/23/2022 Discharge date: 09/27/2022  Admitted From: home  Disposition: home w/ home health   Recommendations for Outpatient Follow-up:  Follow up with PCP in 1-2 weeks F/u w/ podiatry, Dr. Luana Shu, in 1-2 weeks  Home Health: yes as pt and pt's family refused SNF Equipment/Devices: wheelchair   Discharge Condition: stable  CODE STATUS: full  Diet recommendation: Heart Healthy   Brief/Interim Summary: HPI was taken from Dr. Charleen Kirks: Thomas Mcdaniel is a 77 y.o. male with medical history significant of hypertension, overactive bladder, B12 deficiency, neuropathy with frequent falls, who presents to the ED after a ground-level fall.   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  When he tripped, he fell over and heard a loud pop from his left ankle.  He denies hitting his head or any loss of consciousness.  He has been unable to bear weight on the left ankle since the fall.  He denies any dizziness, headache, palpitations, chest pain or shortness of breath before or after the fall.   ED course: On arrival to the ED, patient was normotensive at 136/82 with heart rate of 70.  He was saturating at 100% on room air.  He was afebrile at 98.3.  Initial workup notable for WBC of 7.5, hemoglobin 14.3, platelets 138, potassium 3.9, bicarb 23, glucose 129, creatinine 1.08 and GFR above 60. Left ankle x-ray was obtained that demonstrated trimalleolar fracture with dislocation.  Under conscious sedation, left ankle was reapproximated by EDP.  Podiatry and Ortho surgery consulted.  [facility] contacted for admission.  Discharge Diagnoses:  Principal Problem:   Ankle fracture Active Problems:   Falls frequently   Benign prostatic hyperplasia without lower urinary tract symptoms    Essential hypertension   Obstructive sleep apnea syndrome   Prediabetes   Delirium   Left trimalleolar left ankle fracture: with dislocation. Mechanical fall with history of frequent falls.S/p ORIF of left trimalleolar ankle fracture on 09/24/2022. Nonweightbearing of LLE. PT/OT recs SNF but pt refuses and agrees to home health. Pt had a unwitnessed fall this morning 09/27/22 and pt is unsure of how he fell. Vital signs were WNL. Bed alarm was placed.    Delirium: with agitation. Hx of dementia. Continue w/ supportive care. Seroquel, haldol prn for agitation    Vitamin B12 deficiency: continue on B12 supplements    Hypokalemia: WNL today    Thrombocytopenia: WNL today    AKI: resolved    Obesity: BMI 36.1. Would benefit from weight loss   Discharge Instructions  Discharge Instructions     Diet - low sodium heart healthy   Complete by: As directed    Discharge instructions   Complete by: As directed    F/u w/ podiatry, Dr. Luana Shu, in 1-2 weeks. F/u w/ PCP in 1-2 weeks. Non weightbearing of left leg as per podiatry   Increase activity slowly   Complete by: As directed       Allergies as of 09/27/2022       Reactions   Dilaudid [hydromorphone]         Medication List     TAKE these medications    allopurinol 300 MG tablet Commonly known as: ZYLOPRIM Take 300 mg by mouth daily as needed (gout flare).   diclofenac Sodium 1 % Gel Commonly known  as: VOLTAREN Apply 2 g topically 2 (two) times daily.   finasteride 5 MG tablet Commonly known as: PROSCAR Take 5 mg by mouth daily.   HYDROcodone-acetaminophen 5-325 MG tablet Commonly known as: NORCO/VICODIN Take 1 tablet by mouth every 6 (six) hours as needed for up to 3 days for moderate pain or severe pain.   losartan 100 MG tablet Commonly known as: COZAAR Take 100 mg by mouth at bedtime.   memantine 5 MG tablet Commonly known as: NAMENDA Take 5 mg by mouth 2 (two) times daily.   mirabegron ER 50 MG Tb24  tablet Commonly known as: MYRBETRIQ Take 1 tablet (50 mg total) by mouth daily.   mirtazapine 15 MG tablet Commonly known as: REMERON Take 15 mg by mouth at bedtime.   nitrofurantoin (macrocrystal-monohydrate) 100 MG capsule Commonly known as: MACROBID Take 1 capsule (100 mg total) by mouth 2 (two) times daily for 10 days.   pregabalin 25 MG capsule Commonly known as: LYRICA Take 25 mg by mouth daily as needed (1 to 2 capsules as needed).   rosuvastatin 5 MG tablet Commonly known as: CRESTOR Take 5 mg by mouth daily.   senna-docusate 8.6-50 MG tablet Commonly known as: Senokot-S Take 1 tablet by mouth 2 (two) times daily. Decrease to 1x daily or hold for diarrhea/loose stools   spironolactone 25 MG tablet Commonly known as: ALDACTONE Take 25 mg by mouth at bedtime.   tamsulosin 0.4 MG Caps capsule Commonly known as: FLOMAX Take 0.4 mg by mouth daily.               Durable Medical Equipment  (From admission, onward)           Start     Ordered   09/27/22 1021  For home use only DME lightweight manual wheelchair with seat cushion  Once       Comments: Patient suffers from left ankle fracture/generalized weakness which impairs their ability to perform daily activities like bathing, dressing, feeding, grooming, and toileting in the home.  A cane, crutch, or walker will not resolve  issue with performing activities of daily living. A wheelchair will allow patient to safely perform daily activities. Patient is not able to propel themselves in the home using a standard weight wheelchair due to endurance and general weakness. Patient can self propel in the lightweight wheelchair. Length of need Lifetime. Accessories: elevating leg rests (ELRs), wheel locks, extensions and anti-tippers.   09/27/22 1021            Follow-up Information     [facility] Follow up.   Why: They will follow up with you for your home health needs.                Allergies  Allergen Reactions   Dilaudid [Hydromorphone]     Consultations: Podiatry    Procedures/Studies: DG Ankle 2 Views Left  Result Date: 09/24/2022 CLINICAL DATA:  Elective surgery. Open reduction internal fixation of ankle fracture. EXAM: LEFT ANKLE - 2 VIEW COMPARISON:  Left ankle radiographs 09/23/2022 and CT left ankle 09/23/2022 FINDINGS: Images were performed intraoperatively without the presence of a radiologist. The patient is undergoing distal fibular lateral plate and screw fixation of the previously seen distal diaphyseal comminuted fracture. Associated tibiofibular screw fixation. Two new screws are seen traversing the medial malleolus. Total fluoroscopy images: 11 Total fluoroscopy time: 116 seconds Total dose: Radiation Exposure Index (as provided by the fluoroscopic device): 6.13 mGy air Kerma Please see intraoperative findings  for further detail. IMPRESSION: Fluoroscopic guidance provided for open reduction internal fixation of distal fibular and medial malleolar fractures. Electronically Signed   By: Yvonne Kendall M.D.   On: 09/24/2022 17:21   DG C-Arm 1-60 Min-No Report  Result Date: 09/24/2022 Fluoroscopy was utilized by the requesting physician.  No radiographic interpretation.   DG C-Arm 1-60 Min-No Report  Result Date: 09/24/2022 Fluoroscopy was utilized by the requesting physician.  No radiographic interpretation.   DG C-Arm 1-60 Min-No Report  Result Date: 09/24/2022 Fluoroscopy was utilized by the requesting physician.  No radiographic interpretation.   [place] OR NERVE BLOCK-IMAGE ONLY [facility])  Result Date: 09/24/2022 There is no interpretation for this exam.  This order is for images obtained during a surgical procedure.  Please See "Surgeries" Tab for more information regarding the procedure.   [place] OR NERVE BLOCK-IMAGE ONLY [facility])  Result Date: 09/24/2022 There is no interpretation for this exam.  This order is for images obtained during a surgical procedure.   Please See "Surgeries" Tab for more information regarding the procedure.   CT Ankle Left Wo Contrast  Result Date: 09/23/2022 CLINICAL DATA:  Trimalleolar ankle fracture post reduction. EXAM: CT OF THE LEFT ANKLE WITHOUT CONTRAST TECHNIQUE: Multidetector CT imaging of the left ankle was performed according to the standard protocol. Multiplanar CT image reconstructions were also generated. RADIATION DOSE REDUCTION: This exam was performed according to the departmental dose-optimization program which includes automated exposure control, adjustment of the mA and/or kV according to patient size and/or use of iterative reconstruction technique. COMPARISON:  Radiographs same day. FINDINGS: Bones/Joint/Cartilage The ankle is splinted. There is near anatomic reduction of the comminuted fracture of the distal fibular diaphysis, centered approximately 5 cm proximal to the tibial plafond. The distal fibula is intact. There is near anatomic reduction of the oblique fracture through the base of the medial malleolus. The fracture of the posterior malleolus also demonstrates near anatomic reduction. Intra-articular avulsion fracture involving the anterolateral aspect of the distal tibia mediated by the anterior inferior tibiofibular ligament demonstrates 10 mm of lateral displacement (image 142/5). No residual subluxation of the talar dome. There is a small ankle joint effusion with probable small intra-articular fracture fragments both anteriorly and posteriorly. The talar dome appears intact. There is suspicion of a nondisplaced intra-articular fracture involving the base of the 3rd metatarsal, best seen on the axial images. No apparent significant subluxation at the Lisfranc joint. The additional tarsal bones appear intact. Ligaments Suboptimally assessed by CT. As above, probable avulsion fracture of the anterolateral distal tibia by the anterior inferior tibiofibular ligament. Muscles and Tendons Diffuse fatty atrophy of  the visualized distal gastrocnemius musculature. There are calcifications associated with the peroneal tendons. The ankle tendons appear intact without entrapment in the fractures. There are prominent calcifications within the plantar fascia which appear chronic. Soft tissues Soft tissue swelling in the lower leg without foreign body or soft tissue emphysema. No significant focal hematoma. IMPRESSION: 1. Near anatomic reduction of the trimalleolar fracture status post reduction and splinting. 2. Intra-articular avulsion fracture of the anterolateral aspect of the distal tibia mediated by the anterior inferior tibiofibular ligament. 3. Suspected nondisplaced intra-articular fracture involving the base of the 3rd metatarsal. 4. No residual subluxation of the talar dome. Small joint effusion with probable small intra-articular fracture fragments. 5. Diffuse fatty atrophy of the visualized distal gastrocnemius musculature. Electronically Signed   By: Richardean Sale M.D.   On: 09/23/2022 16:17   DG Ankle 2 Views Left  Result Date:  09/23/2022 CLINICAL DATA:  Postreduction. EXAM: LEFT ANKLE - 2 VIEW COMPARISON:  Ankle and lower leg radiographs earlier the same date. FINDINGS: 1525 hours. Interval improved alignment of the previously demonstrated trimalleolar fracture with near anatomic reduction. The fracture of the distal fibular diaphysis is mildly comminuted. There is a possible small fracture fragment between the medial aspect of the tibial plafond and talar dome. No significant residual subluxation of the talus. Associated soft tissue injury. IMPRESSION: Improved alignment of the trimalleolar fracture post reduction as described. Electronically Signed   By: Richardean Sale M.D.   On: 09/23/2022 15:40   DG Tibia/Fibula Left  Result Date: 09/23/2022 CLINICAL DATA:  [place].  Pain and deformity. EXAM: LEFT TIBIA AND FIBULA - 2 VIEW COMPARISON:  Ankle images same day FINDINGS: Proximal tibia and fibula are negative.  No evidence of knee injury. See ankle report regarding trimalleolar fracture dislocation. IMPRESSION: Proximal tibia and fibula are negative. See ankle report regarding trimalleolar fracture dislocation. Electronically Signed   By: Nelson Chimes M.D.   On: 09/23/2022 14:30   DG Ankle Complete Left  Result Date: 09/23/2022 CLINICAL DATA:  Fall with pain and deformity EXAM: LEFT ANKLE COMPLETE - 3+ VIEW COMPARISON:  None Available. FINDINGS: Trimalleolar fracture dislocation. Oblique fracture of the distal fibular diaphysis with anterior angulation. Transverse fracture of the medial malleolus. Marked widening of the ankle mortise. Coronal fracture of the posterior tibial lip. Perching of the posterior tibia on the talar dome. IMPRESSION: Trimalleolar fracture dislocation of the left ankle. Electronically Signed   By: Nelson Chimes M.D.   On: 09/23/2022 14:29   (Echo, Carotid, EGD, Colonoscopy, ERCP)    Subjective: Pt c/o foot pain    Discharge Exam: Vitals:   09/27/22 1045 09/27/22 1536  BP:  129/68  Pulse:  85  Resp:  16  Temp:  98 F (36.7 C)  SpO2: 97% 96%   Vitals:   09/27/22 0815 09/27/22 0915 09/27/22 1045 09/27/22 1536  BP: 95/64 135/87  129/68  Pulse: 80 84  85  Resp:  20  16  Temp:  98.9 F (37.2 C)  98 F (36.7 C)  TempSrc:  Oral    SpO2: 96% 94% 97% 96%  Weight:      Height:        General: Pt is alert, awake, not in acute distress Cardiovascular: S1/S2 +, no rubs, no gallops Respiratory: CTA bilaterally, no wheezing, no rhonchi Abdominal: Soft, NT, ND, bowel sounds + Extremities: no edema, no cyanosis    The results of significant diagnostics from this hospitalization (including imaging, microbiology, ancillary and laboratory) are listed below for reference.     Microbiology: Recent Results (from the past 240 hour(s))  Microscopic Examination     Status: None   Collection Time: 09/20/22  9:23 AM   Urine  Result Value Ref Range Status   WBC, UA 0-5 0 - 5  /hpf Final   RBC, Urine 0-2 0 - 2 /hpf Final   Epithelial Cells (non renal) 0-10 0 - 10 /hpf Final   Bacteria, UA Few None seen/Few Final  CULTURE, URINE COMPREHENSIVE     Status: Abnormal   Collection Time: 09/20/22  9:59 AM   Specimen: Urine   UR  Result Value Ref Range Status   Urine Culture, Comprehensive Final report (A)  Final   Organism ID, Bacteria Enterococcus faecalis (A)  Final    Comment: For Enterococcus species, aminoglycosides (except for high-level resistance screening), cephalosporins, clindamycin, and trimethoprim-sulfamethoxazole are not  effective clinically. (CLSI, M100-S26, 2016) 10,000-25,000 colony forming units per mL    ANTIMICROBIAL SUSCEPTIBILITY Comment  Final    Comment:       ** S = Susceptible; I = Intermediate; R = Resistant **                    P = Positive; N = Negative             MICS are expressed in micrograms per mL    Antibiotic                 RSLT#1    RSLT#2    RSLT#3    RSLT#4 Ciprofloxacin                  R Levofloxacin                   R Nitrofurantoin                 S Penicillin                     S Tetracycline                   R Vancomycin                     S      Labs: BNP (last 3 results) No results for input(s): "BNP" in the last 8760 hours. Basic Metabolic Panel: Recent Labs  Lab 09/23/22 1407 09/24/22 0637 09/25/22 1558 09/26/22 0453 09/27/22 0507  NA 137 138 138 137 136  K 3.9 3.5 3.7 3.4* 3.9  CL 107 106 103 105 104  CO2 23 25 26 23 22   GLUCOSE 129* 116* 128* 116* 140*  BUN 22 22 18 22 23   CREATININE 1.08 1.25* 0.92 0.97 0.86  CALCIUM 9.4 9.1 9.4 9.4 9.3   Liver Function Tests: Recent Labs  Lab 09/23/22 1407  AST 35  ALT 24  ALKPHOS 54  BILITOT 0.8  PROT 7.0  ALBUMIN 4.0   No results for input(s): "LIPASE", "AMYLASE" in the last 168 hours. No results for input(s): "AMMONIA" in the last 168 hours. CBC: Recent Labs  Lab 09/23/22 1407 09/24/22 0637 09/25/22 0521 09/26/22 0453  09/27/22 0507  WBC 7.5 7.0 9.9 9.9 8.9  NEUTROABS 5.1  --  8.6* 7.5  --   HGB 14.3 13.4 14.5 13.6 14.2  HCT 42.0 39.7 44.9 40.6 41.0  MCV 90.9 91.9 95.3 92.3 89.9  PLT 138* 128* 107* 131* 150   Cardiac Enzymes: No results for input(s): "CKTOTAL", "CKMB", "CKMBINDEX", "TROPONINI" in the last 168 hours. BNP: Invalid input(s): "POCBNP" CBG: Recent Labs  Lab 09/27/22 0716  GLUCAP 164*   D-Dimer No results for input(s): "DDIMER" in the last 72 hours. Hgb A1c No results for input(s): "HGBA1C" in the last 72 hours. Lipid Profile No results for input(s): "CHOL", "HDL", "LDLCALC", "TRIG", "CHOLHDL", "LDLDIRECT" in the last 72 hours. Thyroid function studies No results for input(s): "TSH", "T4TOTAL", "T3FREE", "THYROIDAB" in the last 72 hours.  Invalid input(s): "FREET3" Anemia work up No results for input(s): "VITAMINB12", "FOLATE", "FERRITIN", "TIBC", "IRON", "RETICCTPCT" in the last 72 hours. Urinalysis    Component Value Date/Time   COLORURINE YELLOW (A) 12/17/2020 1229   APPEARANCEUR Clear 09/20/2022 0923   LABSPEC 1.014 12/17/2020 1229   PHURINE 6.0 12/17/2020 1229   GLUCOSEU Negative 09/20/2022 0923   HGBUR LARGE (A) 12/17/2020  [place] Negative 09/20/2022 [place] 12/17/2020 1229   PROTEINUR Negative 09/20/2022 0923   PROTEINUR 100 (A) 12/17/2020 1229   NITRITE Negative 09/20/2022 0923   NITRITE NEGATIVE 12/17/2020 1229   LEUKOCYTESUR Trace (A) 09/20/2022 0923   LEUKOCYTESUR MODERATE (A) 12/17/2020 1229   Sepsis Labs Recent Labs  Lab 09/24/22 [id number] 09/25/22 0521 09/26/22 0453 09/27/22 0507  WBC 7.0 9.9 9.9 8.9   Microbiology Recent Results (from the past 240 hour(s))  Microscopic Examination     Status: None   Collection Time: 09/20/22  9:23 AM   Urine  Result Value Ref Range Status   WBC, UA 0-5 0 - 5 /hpf Final   RBC, Urine 0-2 0 - 2 /hpf Final   Epithelial Cells (non renal) 0-10 0 - 10 /hpf Final   Bacteria, UA Few None  seen/Few Final  CULTURE, URINE COMPREHENSIVE     Status: Abnormal   Collection Time: 09/20/22  9:59 AM   Specimen: Urine   UR  Result Value Ref Range Status   Urine Culture, Comprehensive Final report (A)  Final   Organism ID, Bacteria Enterococcus faecalis (A)  Final    Comment: For Enterococcus species, aminoglycosides (except for high-level resistance screening), cephalosporins, clindamycin, and trimethoprim-sulfamethoxazole are not effective clinically. (CLSI, M100-S26, 2016) 10,000-25,000 colony forming units per mL    ANTIMICROBIAL SUSCEPTIBILITY Comment  Final    Comment:       ** S = Susceptible; I = Intermediate; R = Resistant **                    P = Positive; N = Negative             MICS are expressed in micrograms per mL    Antibiotic                 RSLT#1    RSLT#2    RSLT#3    RSLT#4 Ciprofloxacin                  R Levofloxacin                   R Nitrofurantoin                 S Penicillin                     S Tetracycline                   R Vancomycin                     S      Time coordinating discharge: Over 30 minutes  SIGNED:   Wyvonnia Dusky, MD  [facility] 09/27/2022, 4:11 PM Pager   If 7PM-7AM, please contact night-coverage [email or web address]

## 2022-09-27 NOTE — Progress Notes (Signed)
Physical Therapy Treatment Patient Details Name: Thomas Mcdaniel MRN: [id number] DOB: [birth year] Today's Date: 09/27/2022   History of Present Illness Pt is a 77 y.o. male with PMH that includes hypertension, RLE drop foot, overactive bladder, B12 deficiency, neuropathy with frequent falls, HOH, dementia, bradycardia, and R TKA who presents to the ED after a ground-level fall.  Pt diagnosed with displaced left trimalleolar ankle fracture and is s/p ORIF, syndesmotic fixation ankle, and talus bone graft.    PT Comments    Patient agreeable to PT. The patient was unable to stand today with +2 person assistance. He has difficulty with maintaining NWB of LLE with activity despite cues. He required +2 person assistance for incremental scooting transfer along edge of bed. Activity tolerance limited by fatigue. Sp02 97-93% on room air. Recommend to continue PT to maximize independence and decrease caregiver burden. Anticipate the need for frequent/constant supervision assistance with continued PT after hospital discharge.    Recommendations for follow up therapy are one component of a multi-disciplinary discharge planning process, led by the attending physician.  Recommendations may be updated based on patient status, additional functional criteria and insurance authorization.  Follow Up Recommendations  Can patient physically be transported by private vehicle: No    Assistance Recommended at Discharge Frequent or constant Supervision/Assistance  Patient can return home with the following Two people to help with walking and/or transfers;A lot of help with bathing/dressing/bathroom;Assistance with cooking/housework;Direct supervision/assist for medications management;Assist for transportation   Equipment Recommendations  Wheelchair (measurements PT);Wheelchair cushion (measurements PT);Rolling walker (2 wheels);Other (comment);BSC/3in1 (mechanical lift. DME is recommended for home if family decides to  take patient home.)    Recommendations for Other Services       Precautions / Restrictions Precautions Precautions: Fall Required Braces or Orthoses: Splint/Cast Restrictions Weight Bearing Restrictions: Yes LLE Weight Bearing: Non weight bearing     Mobility  Bed Mobility Overal bed mobility: Needs Assistance Bed Mobility: Supine to Sit, Sit to Supine Rolling: Mod assist   Supine to sit: Mod assist Sit to supine: Mod assist, +2 for physical assistance   General bed mobility comments: verbal cues for technique. assistance for trunk and BLE to sit upright. increased assistance required for return to bed    Transfers Overall transfer level: Needs assistance Equipment used: Rolling walker (2 wheels) Transfers: Sit to/from Stand, Bed to chair/wheelchair/BSC            Lateral/Scoot Transfers: Mod assist, +2 physical assistance, From elevated surface General transfer comment: the patient was unable to stand with bed elevated and +2 person assistance using rolling walker despite multi modal cues for technique. the patient has difficulty maintaining NWB of LLE despite maximal verbal cues for technique. assistance required for incremental lateral scooting to the right    Ambulation/Gait                   Stairs             Wheelchair Mobility    Modified Rankin (Stroke Patients Only)       Balance Overall balance assessment: Needs assistance Sitting-balance support: Bilateral upper extremity supported Sitting balance-Leahy Scale: Fair Sitting balance - Comments: mildly impulsive with seated level activity. cues for safety provided                                    Cognition Arousal/Alertness: Awake/alert Behavior During Therapy: Flat affect, Impulsive Overall Cognitive  Status: Impaired/Different from baseline                       Memory: Decreased recall of precautions, Decreased short-term memory Following Commands:  Follows one step commands inconsistently Safety/Judgement: Decreased awareness of safety, Decreased awareness of deficits   Problem Solving: Slow processing, Difficulty sequencing, Requires verbal cues, Requires tactile cues          Exercises      General Comments        Pertinent Vitals/Pain Pain Assessment Pain Assessment: Faces Faces Pain Scale: Hurts little more Pain Location: superfically on the top of the right foot Pain Descriptors / Indicators: Tingling Pain Intervention(s): Limited activity within patient's tolerance, Monitored during session, Repositioned    Home Living                          Prior Function            PT Goals (current goals can now be found in the care plan section) Acute Rehab PT Goals Patient Stated Goal: to go home PT Goal Formulation: Patient unable to participate in goal setting Time For Goal Achievement: 10/08/22 Potential to Achieve Goals: Fair Progress towards PT goals: Progressing toward goals    Frequency    BID      PT Plan Current plan remains appropriate;Equipment recommendations need to be updated    Co-evaluation PT/OT/SLP Co-Evaluation/Treatment: Yes Reason for Co-Treatment: Necessary to address cognition/behavior during functional activity;For patient/therapist safety PT goals addressed during session: Mobility/safety with mobility;Balance;Strengthening/ROM OT goals addressed during session: ADL's and self-care      AM-PAC PT "6 Clicks" Mobility   Outcome Measure  Help needed turning from your back to your side while in a flat bed without using bedrails?: A Lot Help needed moving from lying on your back to sitting on the side of a flat bed without using bedrails?: A Lot Help needed moving to and from a bed to a chair (including a wheelchair)?: Total Help needed standing up from a chair using your arms (e.g., wheelchair or bedside chair)?: Total Help needed to walk in hospital room?: Total Help  needed climbing 3-5 steps with a railing? : Total 6 Click Score: 8    End of Session   Activity Tolerance: Patient tolerated treatment well Patient left: in bed;with call bell/phone within reach;with bed alarm set Nurse Communication: Mobility status PT Visit Diagnosis: History of falling (Z91.81);Unsteadiness on feet (R26.81);Other abnormalities of gait and mobility (R26.89);Muscle weakness (generalized) (M62.81)     Time: 1040-1110 PT Time Calculation (min) (ACUTE ONLY): 30 min  Charges:  $Therapeutic Activity: 8-22 mins                     Minna Merritts, PT, MPT    Percell Locus 09/27/2022, 12:16 PM

## 2022-09-27 NOTE — Progress Notes (Signed)
   09/27/22 0719  What Happened  Was fall witnessed? No  Was patient injured? No  Patient found on floor  Found by Staff-comment Concepcion Elk, Valentino Hue.)  Stated prior activity ambulating-unassisted  Provider Notification  Provider Name/Title Eppie Gibson MD  Date Provider Notified 09/27/22  Time Provider Notified [phone]  Method of Notification Page  Notification Reason Fall  Provider response No new orders  Date of Provider Response 09/27/22  Time of Provider Response 0758  Follow Up  Family notified Yes - comment  Time family notified 0810  Additional tests No  Progress note created (see row info) Yes  Blank note created Yes  Adult Fall Risk Assessment  Risk Factor Category (scoring not indicated) High fall risk per protocol (document High fall risk)  Patient Fall Risk Level High fall risk  Adult Fall Risk Interventions  Required Bundle Interventions *See Row Information* High fall risk - low, moderate, and high requirements implemented  Additional Interventions Use of appropriate toileting equipment (bedpan, BSC, etc.);Room near nurses station  Fall intervention(s) refused/Patient educated regarding refusal Bed alarm;Nonskid socks  Screening for Fall Injury Risk (To be completed on HIGH fall risk patients) - Assessing Need for Floor Mats  Risk For Fall Injury- Criteria for Floor Mats Admitted as a result of a fall  Will Implement Floor Mats Yes  Vitals  Temp 98.2 F (36.8 C)  Temp Source Oral  BP 123/78  BP Location Right Arm  BP Method Automatic  Patient Position (if appropriate) Sitting  Pulse Rate (!) 108  Pulse Rate Source Monitor  Resp 18  Oxygen Therapy  SpO2 94 %  O2 Device Nasal Cannula  O2 Flow Rate (L/min) 3 L/min  PAINAD (Pain Assessment in Advanced Dementia)  Breathing 1  Negative Vocalization 0  Facial Expression 1  Body Language 0  Consolability 0  PAINAD Score 2   No new orders

## 2022-09-27 NOTE — TOC CM/SW Note (Signed)
    Durable Medical Equipment  (From admission, onward)           Start     Ordered   09/27/22 1021  For home use only DME lightweight manual wheelchair with seat cushion  Once       Comments: Patient suffers from left ankle fracture/generalized weakness which impairs their ability to perform daily activities like bathing, dressing, feeding, grooming, and toileting in the home.  A cane, crutch, or walker will not resolve  issue with performing activities of daily living. A wheelchair will allow patient to safely perform daily activities. Patient is not able to propel themselves in the home using a standard weight wheelchair due to endurance and general weakness. Patient can self propel in the lightweight wheelchair. Length of need Lifetime. Accessories: elevating leg rests (ELRs), wheel locks, extensions and anti-tippers.   09/27/22 1021

## 2022-09-27 NOTE — Progress Notes (Signed)
Occupational Therapy Treatment Patient Details Name: Thomas Mcdaniel MRN: [id number] DOB: January 31, 1946 Today's Date: 09/27/2022   History of present illness Pt is a 77 y.o. male with PMH that includes hypertension, RLE drop foot, overactive bladder, B12 deficiency, neuropathy with frequent falls, HOH, dementia, bradycardia, and R TKA who presents to the ED after a ground-level fall.  Pt diagnosed with displaced left trimalleolar ankle fracture and is s/p ORIF, syndesmotic fixation ankle, and talus bone graft.   OT comments  Thomas Mcdaniel was seen for OT/PT co-treatment on this date. Upon arrival to room pt reclined in bed, agreeable to tx. Pt requires MOD A exit bed, fair sitting balance. MOD A for LB access, mild impulsivity noted. Attempted standing with MAX A x2 + RW , unable to clear rear. MOD A x2 lateral scoot along EOB. Pt making good progress toward goals, will continue to follow POC. Discharge recommendation remains appropriate.     Recommendations for follow up therapy are one component of a multi-disciplinary discharge planning process, led by the attending physician.  Recommendations may be updated based on patient status, additional functional criteria and insurance authorization.    Assistance Recommended at Discharge Frequent or constant Supervision/Assistance  Patient can return home with the following  Two people to help with walking and/or transfers;A lot of help with bathing/dressing/bathroom;Assist for transportation;Assistance with cooking/housework;Help with stairs or ramp for entrance;Direct supervision/assist for financial management;Direct supervision/assist for medications management   Equipment Recommendations  Other (comment) (defer)    Recommendations for Other Services      Precautions / Restrictions Precautions Precautions: Fall Required Braces or Orthoses: Splint/Cast Restrictions Weight Bearing Restrictions: Yes LLE Weight Bearing: Non weight bearing        Mobility Bed Mobility Overal bed mobility: Needs Assistance Bed Mobility: Supine to Sit, Sit to Supine Rolling: Mod assist   Supine to sit: Mod assist Sit to supine: Mod assist, +2 for physical assistance        Transfers Overall transfer level: Needs assistance Equipment used: Rolling walker (2 wheels) Transfers: Sit to/from Stand, Bed to chair/wheelchair/BSC Sit to Stand: Total assist, +2 physical assistance          Lateral/Scoot Transfers: Mod assist, +2 physical assistance, From elevated surface General transfer comment: unable to fully clear rear with +2 to stand and maintain NWBing.     Balance Overall balance assessment: Needs assistance Sitting-balance support: Bilateral upper extremity supported Sitting balance-Leahy Scale: Fair Sitting balance - Comments: mildly impulsive with seated level activity. cues for safety provided                                   ADL either performed or assessed with clinical judgement   ADL Overall ADL's : Needs assistance/impaired                                       General ADL Comments: MOD A don R sock seated EOB.      Cognition Arousal/Alertness: Awake/alert Behavior During Therapy: Flat affect, Impulsive Overall Cognitive Status: Impaired/Different from baseline                       Memory: Decreased recall of precautions, Decreased short-term memory Following Commands: Follows one step commands inconsistently Safety/Judgement: Decreased awareness of safety, Decreased awareness of deficits   Problem Solving:  Slow processing, Difficulty sequencing, Requires verbal cues, Requires tactile cues                     Pertinent Vitals/ Pain       Pain Assessment Pain Assessment: Faces Faces Pain Scale: Hurts little more Pain Location: right foot Pain Descriptors / Indicators: Tingling Pain Intervention(s): Limited activity within patient's tolerance,  Repositioned   Frequency  Min 1X/week        Progress Toward Goals  OT Goals(current goals can now be found in the care plan section)  Progress towards OT goals: Progressing toward goals  Acute Rehab OT Goals OT Goal Formulation: With patient Time For Goal Achievement: 10/09/22 Potential to Achieve Goals: Fair ADL Goals Pt Will Perform Grooming: with set-up;with supervision;sitting Pt Will Perform Lower Body Dressing: with min assist;sitting/lateral leans Pt Will Transfer to Toilet: with min assist;stand pivot transfer;bedside commode Pt Will Perform Toileting - Clothing Manipulation and hygiene: with min assist;sitting/lateral leans  Plan Discharge plan remains appropriate;Frequency remains appropriate    Co-evaluation    PT/OT/SLP Co-Evaluation/Treatment: Yes Reason for Co-Treatment: Necessary to address cognition/behavior during functional activity;For patient/therapist safety PT goals addressed during session: Mobility/safety with mobility;Balance;Strengthening/ROM OT goals addressed during session: ADL's and self-care      AM-PAC OT "6 Clicks" Daily Activity     Outcome Measure   Help from another person eating meals?: A Little Help from another person taking care of personal grooming?: A Little Help from another person toileting, which includes using toliet, bedpan, or urinal?: A Lot Help from another person bathing (including washing, rinsing, drying)?: A Lot Help from another person to put on and taking off regular upper body clothing?: A Little Help from another person to put on and taking off regular lower body clothing?: A Lot 6 Click Score: 15    End of Session    OT Visit Diagnosis: Muscle weakness (generalized) (M62.81);History of falling (Z91.81);Other abnormalities of gait and mobility (R26.89)   Activity Tolerance Patient tolerated treatment well   Patient Left in bed;with call bell/phone within reach;with bed alarm set   Nurse Communication           Time: [id number] OT Time Calculation (min): 23 min  Charges: OT General Charges $OT Visit: 1 Visit OT Treatments $Self Care/Home Management : 8-22 mins  Dessie Coma, M.S. OTR/L  09/27/22, 12:58 PM  ascom [phone]

## 2022-10-11 ENCOUNTER — Encounter: Payer: Self-pay | Admitting: Podiatry

## 2022-10-25 ENCOUNTER — Encounter: Payer: Self-pay | Admitting: Podiatry

## 2022-11-26 ENCOUNTER — Encounter: Payer: Self-pay | Admitting: Emergency Medicine

## 2022-11-26 ENCOUNTER — Other Ambulatory Visit: Payer: Self-pay

## 2022-11-26 ENCOUNTER — Emergency Department
Admission: EM | Admit: 2022-11-26 | Discharge: 2022-11-26 | Disposition: A | Discharge status: Home / Self Care | Payer: Medicare Other | Attending: Emergency Medicine | Admitting: Emergency Medicine

## 2022-11-26 ENCOUNTER — Emergency Department: Payer: Medicare Other

## 2022-11-26 DIAGNOSIS — W19XXXA Unspecified fall, initial encounter: Secondary | ICD-10-CM | POA: Diagnosis not present

## 2022-11-26 DIAGNOSIS — M25572 Pain in left ankle and joints of left foot: Secondary | ICD-10-CM | POA: Diagnosis present

## 2022-11-26 DIAGNOSIS — I10 Essential (primary) hypertension: Secondary | ICD-10-CM | POA: Insufficient documentation

## 2022-11-26 NOTE — ED Triage Notes (Addendum)
Arrives c/o left ankle injury.  States fell and had surgery on ankle 6-8 weeks ago.  Patient with history of ankle surgery.

## 2022-11-26 NOTE — ED Provider Notes (Signed)
[facility] Provider Note    Event Date/Time   First MD Initiated Contact with Patient 11/26/22 0920     (approximate)   History   Ankle Pain   HPI Thomas Mcdaniel is a 77 y.o. male presents emergency department with plaints of left ankle pain.  Patient has a history of a recent surgery 8 weeks ago.  Is followed by Dr. Excell Seltzer in agreement.  Patient also has past medical history of hypertension bradycardia among other chronic medical problems.  Patient has not been wearing his boot.  Wife states that he will transfer to the wheelchair and toe-touch but is not walking on the foot.  States he is having sharp pain in the left ankle.  Was told at his last visit by Dr. Excell Seltzer that a screw was not completely in place.  Thinks this may be causing his pain.  States its intermittent and comes and goes and is sharp and stinging.  Took extra strength Tylenol without any relief      Physical Exam   Triage Vital Signs: ED Triage Vitals  Enc Vitals Group     BP 11/26/22 0912 (!) 150/90     Pulse Rate 11/26/22 0912 70     Resp 11/26/22 0912 16     Temp 11/26/22 0912 98 F (36.7 C)     Temp Source 11/26/22 0912 Oral     SpO2 11/26/22 0912 95 %     Weight 11/26/22 0908 244 lb 11.4 oz (111 kg)     Height 11/26/22 0908 5\' 9"  (1.753 m)     Head Circumference --      Peak Flow --      Pain Score 11/26/22 0908 5     Pain Loc --      Pain Edu? --      Excl. in GC? --     Most recent vital signs: Vitals:   11/26/22 0912  BP: (!) 150/90  Pulse: 70  Resp: 16  Temp: 98 F (36.7 C)  SpO2: 95%     General: Awake, no distress.   CV:  Good peripheral perfusion. regular rate and  rhythm Resp:  Normal effort.  Abd:  No distention.   Other:  Left ankle: Surgical scar noted, mild swelling on the medial malleolus, area is mildly tender, neurovascular is intact   ED Results / Procedures / Treatments   Labs (all labs ordered are listed, but only abnormal results are  displayed) Labs Reviewed - No data to display   EKG     RADIOLOGY X-ray of the left ankle    PROCEDURES:   Procedures   MEDICATIONS ORDERED IN ED: Medications - No data to display   IMPRESSION / MDM / ASSESSMENT AND PLAN / ED COURSE  I reviewed the triage vital signs and the nursing notes.                              Differential diagnosis includes, but is not limited to, hardware complication, osteomyelitis, new fracture, pain control  Patient's presentation is most consistent with acute complicated illness / injury requiring diagnostic workup.   X-ray of the left ankle was independently reviewed interpreted by me as being negative for any acute abnormality  I did explain findings to the patient.  I feel he should follow-up with Dr. Excell Seltzer.  Did a Mangen wrap to give him extra question how long discussion with him  about wearing his boot as he was instructed by his physician.  He is to elevate and ice the ankle.  Take Tylenol as needed.  Return if worsening.  He and his wife are agreement treatment plan.  Patient was discharged.      FINAL CLINICAL IMPRESSION(S) / ED DIAGNOSES   Final diagnoses:  Acute left ankle pain     Rx / DC Orders   ED Discharge Orders     None        Note:  This document was prepared using Dragon voice recognition software and may include unintentional dictation errors.    Faythe Ghee, PA-C 11/26/22 1102    [name], MD 12/01/22 [phone]

## 2023-03-29 ENCOUNTER — Other Ambulatory Visit: Payer: Self-pay | Admitting: Podiatry

## 2023-03-29 DIAGNOSIS — G629 Polyneuropathy, unspecified: Secondary | ICD-10-CM

## 2023-03-29 DIAGNOSIS — Z91199 Patient's noncompliance with other medical treatment and regimen due to unspecified reason: Secondary | ICD-10-CM

## 2023-03-29 DIAGNOSIS — S82852D Displaced trimalleolar fracture of left lower leg, subsequent encounter for closed fracture with routine healing: Secondary | ICD-10-CM

## 2023-03-29 DIAGNOSIS — M19172 Post-traumatic osteoarthritis, left ankle and foot: Secondary | ICD-10-CM

## 2023-03-29 DIAGNOSIS — G8929 Other chronic pain: Secondary | ICD-10-CM

## 2023-04-03 ENCOUNTER — Inpatient Hospital Stay: Admission: RE | Admit: 2023-04-03 | Payer: Medicare Other | Source: Ambulatory Visit

## 2023-04-05 ENCOUNTER — Ambulatory Visit
Admission: RE | Admit: 2023-04-05 | Discharge: 2023-04-05 | Disposition: A | Discharge status: Home / Self Care | Payer: Medicare Other | Source: Ambulatory Visit | Attending: Podiatry | Admitting: Podiatry

## 2023-04-05 DIAGNOSIS — Z91199 Patient's noncompliance with other medical treatment and regimen due to unspecified reason: Secondary | ICD-10-CM

## 2023-04-05 DIAGNOSIS — M19172 Post-traumatic osteoarthritis, left ankle and foot: Secondary | ICD-10-CM

## 2023-04-05 DIAGNOSIS — G8929 Other chronic pain: Secondary | ICD-10-CM

## 2023-04-05 DIAGNOSIS — G629 Polyneuropathy, unspecified: Secondary | ICD-10-CM

## 2023-04-05 DIAGNOSIS — S82852D Displaced trimalleolar fracture of left lower leg, subsequent encounter for closed fracture with routine healing: Secondary | ICD-10-CM

## 2023-07-04 ENCOUNTER — Ambulatory Visit (INDEPENDENT_AMBULATORY_CARE_PROVIDER_SITE_OTHER): Payer: Medicare Other | Admitting: Urology

## 2023-07-04 ENCOUNTER — Encounter: Payer: Self-pay | Admitting: Urology

## 2023-07-04 ENCOUNTER — Telehealth: Payer: Self-pay

## 2023-07-04 VITALS — Ht 69.0 in | Wt 240.0 lb

## 2023-07-04 DIAGNOSIS — N138 Other obstructive and reflux uropathy: Secondary | ICD-10-CM

## 2023-07-04 DIAGNOSIS — N3281 Overactive bladder: Secondary | ICD-10-CM

## 2023-07-04 DIAGNOSIS — N401 Enlarged prostate with lower urinary tract symptoms: Secondary | ICD-10-CM

## 2023-07-04 MED ORDER — FINASTERIDE 5 MG PO TABS: 5.0000 mg | ORAL_TABLET | Freq: Every day | ORAL | 3 refills | Status: AC

## 2023-07-04 MED ORDER — TAMSULOSIN HCL 0.4 MG PO CAPS: 0.4000 mg | ORAL_CAPSULE | Freq: Every day | ORAL | 3 refills | Status: AC

## 2023-07-04 NOTE — Progress Notes (Signed)
   07/04/2023 12:52 PM   [place] LOISE Molt 24-Nov-1945 [id number]  Reason for visit: Follow up nephrolithiasis, nocturia, OAB/urgency frequency, BPH  HPI: 78 year old male here with his wife today for follow-up of the above issues.  His overall health has declined, and he is primarily using a wheelchair to get around at this point.  He has a long history of urinary symptoms including frequency, urgency, urge incontinence, and nocturia.  He has been on Flomax  and finasteride  long-term.  He has also been trialed on multiple OAB medications previously including anticholinergics and Myrbetriq , has been very challenging for him to tell if he is had any significant improvement on those medications.  Most recently was on Myrbetriq  and March 2024 for 2 months, but never filled that prescription moving forward after getting samples.  He also has a history of nephrolithiasis and underwent cystoscopy and ureteroscopy with me in June 2022, the prostate was small and wide open at that time with no evidence of obstruction.  He reports worsening urinary symptoms with urgency, frequency, and urge incontinence.  Some of this sounds related to his decreased mobility and inability to get out of his wheelchair.  He continues to consume high volume of bladder irritants with tea and diet sodas.  His wife feels his frailty in terms of mobility and his fluid consumption is a big contributor to his symptoms.  I had previously recommended considering PTNS but he never followed up, as well as sleep apnea evaluation with his nocturia.  He also was trialed on a course of antibiotics through his primary care doctor without any improvement.  At this point he is bothered enough to consider PTNS.  We again reviewed behavioral strategies.  Risk and benefits were discussed.  Reassurance provided regarding his small and open prostate, and normal PVRs, including 0ml today.  I think we need to have realistic expectations moving  forward.  Continue Flomax  and finasteride , refilled Schedule PTNS Need to have realistic expectations, could consider condom catheter overnight if worsening overnight symptoms  Redell JAYSON Burnet, MD  [facility] [address] [place], [place] [address] [phone]

## 2023-07-04 NOTE — Telephone Encounter (Signed)
-----   Message from [facility] Deshannon S sent at 07/04/2023  1:20 PM EST ----- Regarding: PTNS  schedule PTNS with PA follow up 59mo after completing

## 2023-07-04 NOTE — Patient Instructions (Addendum)
 Urgent PC office-based treatment for overactive bladder Take back control of your life! Urgent PC is a non-drug, non-surgical option for overactive bladder and associated symptoms of urinary urgency, urinary frequency and urge incontinence  Urgent-PC- Since 2003, healthcare professionals have used the Urgent PC Neuromodulation System as an effective office treatment for men and women suffering from overactive bladder, a condition commonly referred to as OAB. Urgent PC is up to 80% effective, even after conservative measures and OAB drugs have failed. Plus, Urgent PC is very low risk, making it a great choice for people unable or unwilling to have more invasive procedures.  How does Urgent PC work?  The Urgent PC system delivers a specific type of neuromodulation called percutaneous tibial nerve stimulation (PTNS). During treatment, a small, slim needle electrode is inserted near your ankle. The needle electrode is then connected to the battery-powered stimulator. During your 30-minute treatment, mild impulses from the stimulator travel through the needle electrode, along your leg and to the nerves in your pelvis that control bladder function. This process is also referred to as neuromodulation.  What will I feel with Urgent PC therapy?  Because patients may experience the sensation of the Urgent PC therapy in different ways, it's difficult to say what the treatment would feel like to you. Patients often describe the sensation as tingling or pulsating. Treatment is typically well-tolerated by patients. Urgent PC offers many different levels of stimulation, so your clinician will be able to adjust treatment to suit you as well as address any discomfort that you might experience during treatment.  How often will I need Urgent PC treatments? You will receive an initial series of 12 treatments scheduled about a week apart. If you respond, you will likely need a treatment about once per month to  maintain your improvements.  How soon will I see results with Urgent PC? Because Urgent PC gently modifies the signals to achieve bladder control, it usually takes 5-7 weeks for symptoms to change. However, patients respond at different rates. In a review of about 100 patients who had success with Urgent PC, symptoms improved anywhere between 2-12 weeks. For about 20% of these patients, the symptoms of urgency and/or urge incontinence didn't improve until after 8 weeks.1  There is no way to anticipate who will respond earlier, later or not at all. That's why it is important to receive the 12 recommended treatments before you and your physician evaluate whether this therapy is an appropriate and effective choice for you.  How can I receive treatment with Urgent PC? Urgent PC is an option for patients with OAB. If you think you have OAB, talk to your doctor, a urologist or urogynecologist. If you have OAB, the doctor will work with you to determine your own personal treatment plan which usually starts with behavior and diet modifications plus medications. Urgent PC is an excellent option if these options don't work or provide sufficient improvements. Treatment with Urgent PC is typically performed at the office of a urologist, urogynecologist or gynecologist. So, if you run out of options with your normal doctor, consider visiting one of these specialists.  Does insurance cover Urgent PC? Urgent PC treatment is reimbursed by Medicare across the [place] . Private insurance coverage varies by state. To see if your insurance company covers Urgent PC, use our Coverage Finder or talk to your Healthcare Provider.  Are there patients who should not be treated with Urgent PC? Yes, these include: patients with pacemakers or implantable defibrillators, patients  prone to excessive bleeding, patients with nerve damage that could impact either percutaneous tibial nerve or pelvic floor function and patients who  are pregnant or planning to become pregnant during the duration of the treatment.  What are the risks associated with Urgent PC? The risks associated with Urgent PC therapy are low. Most common side-effects are temporary and include mild pain or skin inflammation at or near the stimulation site.  Nocturia refers to the need to wake up during the night to urinate, which can disrupt your sleep and impact your overall well-being. Fortunately, there are several strategies you can employ to help prevent or manage nocturia. It's important to consult with your healthcare provider before making any significant changes to your routine. Here are some helpful strategies to consider:  Limit Fluid Intake Before Bed: Avoid drinking large amounts of fluids in the evening, especially within a few hours of bedtime. Consume most of your daily fluid intake earlier in the day to reduce the need to urinate at night.  Monitor Your Diet: Limit your intake of caffeine and alcohol, as these substances can increase urine production and irritate the bladder.  Avoid diet, zero calorie, and artificially sweetened drinks, especially sodas, in the afternoon or evening. Be mindful of consuming foods and drinks with high water content before bedtime, such as watermelon and herbal teas.  Time Your Medications: If you're taking medications that contribute to increased urination, consult your healthcare provider about adjusting the timing of these medications to minimize their impact during the night.  Practice Double Voiding: Before going to bed, make an effort to empty your bladder twice within a short period. This can help reduce the amount of urine left in your bladder before sleep.  Bladder Training: Gradually increase the time between bathroom visits during the day to train your bladder to hold larger volumes of urine. Over time, this can help reduce the frequency of nighttime awakenings to urinate.  Elevate Your Legs  During the Day: Elevating your legs during the day can help minimize fluid retention in your lower extremities, which might reduce nighttime urination.  Pelvic Floor Exercises: Strengthening your pelvic floor muscles through Kegel exercises can help improve bladder control and potentially reduce the urge to urinate at night.  Create a Relaxing Bedtime Routine: Stress and anxiety can exacerbate nocturia. Engage in calming activities before bed, such as reading, listening to soothing music, or practicing relaxation techniques.  Stay Active: Engage in regular physical activity, but avoid intense exercise close to bedtime, as this can increase your body's demand for fluids.  Maintain a Healthy Weight: Excess weight can compress the bladder and contribute to bladder and urinary issues. Aim to achieve and maintain a healthy weight through a balanced diet and regular exercise.  Remember that every individual is unique, and the effectiveness of these strategies may vary. It's important to work with your healthcare provider to develop a plan that suits your specific needs and addresses any underlying causes of nocturia.

## 2023-07-08 NOTE — Telephone Encounter (Signed)
 NO PA needed and no copay needed. Mrs Revak advised and scheduled.

## 2023-07-17 ENCOUNTER — Ambulatory Visit: Payer: Medicare Other | Admitting: Physician Assistant

## 2023-07-19 ENCOUNTER — Ambulatory Visit (INDEPENDENT_AMBULATORY_CARE_PROVIDER_SITE_OTHER): Payer: Medicare Other | Admitting: Physician Assistant

## 2023-07-19 DIAGNOSIS — N3281 Overactive bladder: Secondary | ICD-10-CM

## 2023-07-19 NOTE — Patient Instructions (Signed)

## 2023-07-19 NOTE — Progress Notes (Signed)
Patient ID: Thomas Mcdaniel, male   DOB: [birth year], 78 y.o.   MRN: [id number] PTNS  Session # 1  Health & Social Factors: no change Caffeine: 5-6 Alcohol: 0 Daytime voids #per day: 5-6 Night-time voids #per night: 5-6 Urgency: strong Incontinence Episodes #per day: 1-2 Ankle used: right Treatment Setting: 17 Feeling/ Response: sensory Comments: Patient tolerated well. Consent signed. He has hardware in his left ankle; will plan for right-sided treatments only.  Performed By: Carman Ching, PA-C   Follow Up: 1 week

## 2023-07-22 NOTE — Progress Notes (Unsigned)
PTNS  Session # 2/12 weekly   Health & Social Factors: No change Caffeine: 0 Alcohol: 0 Daytime voids #per day: 5-6 Night-time voids #per night: 6 Urgency: Mild  Incontinence Episodes #per day: 0 Ankle used: Right  Treatment Setting: 9 Feeling/ Response: Sensory  Comments: Patient tolerated.   Performed By: Michiel Cowboy, PA-C   Follow Up: Follow-up in 1 week for number 3 out of 12 weekly PTNS

## 2023-07-24 ENCOUNTER — Ambulatory Visit (INDEPENDENT_AMBULATORY_CARE_PROVIDER_SITE_OTHER): Payer: Medicare Other | Admitting: Urology

## 2023-07-24 DIAGNOSIS — N3281 Overactive bladder: Secondary | ICD-10-CM | POA: Diagnosis not present

## 2023-07-29 ENCOUNTER — Other Ambulatory Visit: Payer: Self-pay

## 2023-07-29 ENCOUNTER — Emergency Department
Admission: EM | Admit: 2023-07-29 | Discharge: 2023-07-29 | Disposition: A | Discharge status: Home / Self Care | Payer: Medicare Other | Attending: Emergency Medicine | Admitting: Emergency Medicine

## 2023-07-29 ENCOUNTER — Emergency Department: Payer: Medicare Other

## 2023-07-29 DIAGNOSIS — Z85828 Personal history of other malignant neoplasm of skin: Secondary | ICD-10-CM | POA: Insufficient documentation

## 2023-07-29 DIAGNOSIS — M79671 Pain in right foot: Secondary | ICD-10-CM | POA: Diagnosis present

## 2023-07-29 DIAGNOSIS — I10 Essential (primary) hypertension: Secondary | ICD-10-CM | POA: Insufficient documentation

## 2023-07-29 DIAGNOSIS — Z79899 Other long term (current) drug therapy: Secondary | ICD-10-CM | POA: Insufficient documentation

## 2023-07-29 DIAGNOSIS — Z96651 Presence of right artificial knee joint: Secondary | ICD-10-CM | POA: Diagnosis not present

## 2023-07-29 MED ORDER — HYDROCODONE-ACETAMINOPHEN 5-325 MG PO TABS
1.0000 | ORAL_TABLET | Freq: Once | ORAL | Status: AC
  Administered 2023-07-29: 1 via ORAL
  Filled 2023-07-29: qty 1

## 2023-07-29 MED ORDER — HYDROCODONE-ACETAMINOPHEN 5-325 MG PO TABS: 1.0000 | ORAL_TABLET | Freq: Three times a day (TID) | ORAL | 0 refills | Status: DC | PRN

## 2023-07-29 MED ORDER — ONDANSETRON 4 MG PO TBDP
4.0000 mg | ORAL_TABLET | Freq: Once | ORAL | Status: AC
  Administered 2023-07-29: 4 mg via ORAL
  Filled 2023-07-29: qty 1

## 2023-07-29 MED ORDER — ONDANSETRON 4 MG PO TBDP: 4.0000 mg | ORAL_TABLET | Freq: Four times a day (QID) | ORAL | 0 refills | Status: DC | PRN

## 2023-07-29 NOTE — ED Provider Notes (Signed)
[facility] Provider Note    Event Date/Time   First MD Initiated Contact with Patient 07/29/23 [phone]     (approximate)   History   Foot Pain   HPI  Thomas Mcdaniel is a 78 y.o. male with history of hypertension, hyperlipidemia, spinal stenosis who presents to the emergency department with right lateral foot pain.  Has noticed some soft tissue swelling to this area but denies any injury.  Ambulates at baseline with a cane.  No calf tenderness or calf swelling.  No new numbness or weakness.   History provided by patient, wife.    Past Medical History:  Diagnosis Date   Acquired spondylolisthesis    BPH (benign prostatic hyperplasia)    Bradycardia    Burn    Bursitis    right elbow    Cancer (HCC)    skin cancer   Complication of anesthesia    difficult to wake up after a lithotripsy   DJD (degenerative joint disease)    Foot drop    bil.   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    Nephrolithiasis    Obesity    Peripheral polyneuropathy    Personal history of PE (pulmonary embolism)    PONV (postoperative nausea and vomiting)    after cataract surgery   Pre-diabetes    Scoliosis    Sleep apnea    pt denies   SOB (shortness of breath)    Spinal stenosis, lumbar region without neurogenic claudication    Urine frequency    UTI (urinary tract infection) during pregnancy, unspecified trimester     Past Surgical History:  Procedure Laterality Date   CATARACT EXTRACTION     CATARACT EXTRACTION W/PHACO Right 08/25/2019   Procedure: CATARACT EXTRACTION PHACO AND INTRAOCULAR LENS  PLACEMENT (IOC) MALYUGIN RIGHT 7.94  00:48.5;  Surgeon: Galen Manila, MD;  Location: [facility];  Service: Ophthalmology;  Laterality: Right;   COLONOSCOPY WITH PROPOFOL N/A 08/01/2015   Procedure: COLONOSCOPY WITH PROPOFOL;  Surgeon: Elnita Maxwell, MD;  Location: [facility] ENDOSCOPY;  Service: Endoscopy;  Laterality: N/A;   COLONOSCOPY WITH PROPOFOL N/A 09/25/2019   Procedure: COLONOSCOPY WITH PROPOFOL;  Surgeon: Earline Mayotte, MD;  Location: [facility] ENDOSCOPY;  Service: Endoscopy;  Laterality: N/A;   CORONARY PRESSURE/FFR WITH 3D MAPPING N/A 12/07/2021   Procedure: Coronary Pressure Wire/FFR w/3D Mapping;  Surgeon: Marcina Millard, MD;  Location: [facility] INVASIVE CV LAB;  Service: Cardiovascular;  Laterality: N/A;   CYSTOSCOPY/URETEROSCOPY/HOLMIUM LASER/STENT PLACEMENT Right 12/16/2020   Procedure: CYSTOSCOPY/URETEROSCOPY/HOLMIUM LASER/STENT PLACEMENT;  Surgeon: Sondra Come, MD;  Location: [facility] ORS;  Service: Urology;  Laterality: Right;   DIAGNOSTIC LAPAROSCOPY     FEMORAL ARTERY REPAIR     HERNIA REPAIR     JOINT REPLACEMENT Right    knee   KNEE ARTHROPLASTY Right 12/05/2015   Procedure: COMPUTER ASSISTED TOTAL KNEE ARTHROPLASTY;  Surgeon: Donato Heinz, MD;  Location: [facility] ORS;  Service: Orthopedics;  Laterality: Right;   knee arthroscopy     LAMINECTOMY  LEFT HEART CATH AND CORONARY ANGIOGRAPHY N/A 12/07/2021   Procedure: LEFT HEART CATH AND CORONARY ANGIOGRAPHY;  Surgeon: Marcina Millard, MD;  Location: [facility] INVASIVE CV [facility];  Service: Cardiovascular;  Laterality: N/A;   LITHOTRIPSY     LITHOTRIPSY     ORIF ANKLE FRACTURE Left 09/24/2022   Procedure: OPEN REDUCTION INTERNAL FIXATION (ORIF) ANKLE FRACTURE, SYNDESMOTIC FIXATION, TALUS RETROGRADE FILLING CYSTIC CHANGES WITHIN THE TALUS WITH BONE GRAFT;  Surgeon: Rosetta Posner, DPM;  Location: [facility] ORS;  Service: Podiatry;  Laterality: Left;   SPINAL FUSION     TOE AMPUTATION     TONSILLECTOMY       MEDICATIONS:  Prior to Admission medications   Medication Sig Start Date End Date Taking? Authorizing Provider  allopurinol (ZYLOPRIM) 300 MG tablet Take 300 mg by mouth daily as needed (gout flare).    [provider]  diclofenac Sodium (VOLTAREN) 1 % GEL Apply 2 g topically 2 (two) times daily. 09/01/22   [provider]  finasteride (PROSCAR) 5 MG tablet Take 1 tablet (5 mg total) by mouth daily. 07/04/23   Sondra Come, MD  losartan (COZAAR) 100 MG tablet Take 100 mg by mouth at bedtime. 07/14/20   [provider]  memantine (NAMENDA) 5 MG tablet Take 5 mg by mouth 2 (two) times daily. 08/27/22   [provider]  rosuvastatin (CRESTOR) 5 MG tablet Take 5 mg by mouth daily.    [provider]  senna-docusate (SENOKOT-S) 8.6-50 MG tablet Take 1 tablet by mouth 2 (two) times daily. Decrease to 1x daily or hold for diarrhea/loose stools 02/22/21   Sondra Come, MD  spironolactone (ALDACTONE) 25 MG tablet Take 25 mg by mouth at bedtime. 09/05/20   [provider]  tamsulosin (FLOMAX) 0.4 MG CAPS capsule Take 1 capsule (0.4 mg total) by mouth daily. 07/04/23   Sondra Come, MD  famotidine (PEPCID) 40 MG tablet Take 40 mg by mouth daily as needed.   01/12/20  [provider]    Physical Exam   Triage Vital Signs: ED Triage Vitals  Encounter Vitals Group     BP 07/29/23 0445 (!) 173/95     Systolic BP Percentile --      Diastolic BP Percentile --      Pulse Rate 07/29/23 0445 70     Resp 07/29/23 0445 18     Temp 07/29/23 0445 97.6 F (36.4 C)     Temp Source 07/29/23 0445 Oral     SpO2 07/29/23 0445 92 %     Weight 07/29/23 0444 240 lb (108.9 kg)     Height 07/29/23 0444 5\' 9"  (1.753 m)     Head Circumference --      Peak Flow --      Pain Score 07/29/23 0444 7     Pain Loc --      Pain Education --      Exclude from Growth Chart --     Most recent vital signs: Vitals:   07/29/23 0445  BP: (!) 173/95  Pulse:  70  Resp: 18  Temp: 97.6 F (36.4 C)  SpO2: 92%     CONSTITUTIONAL: Alert and responds appropriately to questions. Well-appearing; well-nourished HEAD: Normocephalic, atraumatic EYES: Conjunctivae clear, pupils appear equal ENT: normal nose; moist mucous membranes NECK: Normal range of motion CARD: Regular rate and rhythm RESP: Normal chest excursion without splinting or tachypnea; no hypoxia or respiratory distress, speaking full sentences ABD/GI: non-distended EXT: Normal ROM in all  joints, no major deformities noted, tender to palpation over the right lateral foot.  No increased redness, warmth, edema, ecchymosis.  No joint effusion.  2+ DP pulse.  No calf tenderness or calf swelling.  Compartments in the leg soft.  Ambulates with cane at baseline. SKIN: Normal color for age and race, no rashes on exposed skin NEURO: Moves all extremities equally, normal speech, no facial asymmetry noted PSYCH: The patient's mood and manner are appropriate. Grooming and personal hygiene are appropriate.  ED Results / Procedures / Treatments   LABS: (all labs ordered are listed, but only abnormal results are displayed) Labs Reviewed - No data to display   EKG:  RADIOLOGY: My personal review and interpretation of imaging: X-ray shows no acute abnormality.  I have personally reviewed all radiology reports. DG Foot Complete Right Result Date: 07/29/2023 CLINICAL DATA:  Right foot pain. EXAM: RIGHT FOOT COMPLETE - 3 VIEW COMPARISON:  None Available. FINDINGS: Negative for fracture or dislocation. Mild degenerative spurring at the first MTP joint. Plantar heel spur with adjacent plantar soft tissue calcification. Subjective generalized osteopenia. IMPRESSION: No acute finding Electronically Signed   By: Tiburcio Pea M.D.   On: 07/29/2023 05:43     PROCEDURES:  Critical Care performed: No      Procedures    IMPRESSION / MDM / ASSESSMENT AND PLAN / ED COURSE  I reviewed the triage  vital signs and the nursing notes.   Patient here with right foot pain.  No injury.    DIFFERENTIAL DIAGNOSIS (includes but not limited to):   Tendinitis, sprain, pathological fracture, no signs of cellulitis, septic arthritis, gout, compartment syndrome, arterial obstruction, DVT  Patient's presentation is most consistent with acute complicated illness / injury requiring diagnostic workup.  PLAN: Will obtain x-ray.  Will give pain medication.   MEDICATIONS GIVEN IN ED: Medications  HYDROcodone-acetaminophen (NORCO/VICODIN) 5-325 MG per tablet 1 tablet (1 tablet Oral Given 07/29/23 0603)  ondansetron (ZOFRAN-ODT) disintegrating tablet 4 mg (4 mg Oral Given 07/29/23 0604)     ED COURSE: X-ray reviewed and interpreted by myself and the radiologist and shows no acute abnormality.  I suspect that he may have tendinitis given the location of his pain.  No sign of gout, cellulitis on exam.  He has a strong pulse and extremity is warm and well-perfused.  Will give orthopedic follow-up information.  Recommended RICE.  Will discharge with pain medication.  At this time, I do not feel there is any life-threatening condition present. I reviewed all nursing notes, vitals, pertinent previous records.  All lab and urine results, EKGs, imaging ordered have been independently reviewed and interpreted by myself.  I reviewed all available radiology reports from any imaging ordered this visit.  Based on my assessment, I feel the patient is safe to be discharged home without further emergent workup and can continue workup as an outpatient as needed. Discussed all findings, treatment plan as well as usual and customary return precautions.  They verbalize understanding and are comfortable with this plan.  Outpatient follow-up has been provided as needed.  All questions have been answered.    CONSULTS:  none   OUTSIDE RECORDS REVIEWED: Reviewed last family medicine note on 07/03/2023.     FINAL CLINICAL  IMPRESSION(S) / ED DIAGNOSES   Final diagnoses:  Right foot pain     Rx / DC Orders   ED Discharge Orders          Ordered    HYDROcodone-acetaminophen (NORCO/VICODIN) 5-325 MG tablet  Every 8 hours PRN,   Status:  Discontinued        07/29/23 0611    ondansetron (ZOFRAN-ODT) 4 MG disintegrating tablet  Every 6 hours PRN        07/29/23 0611    HYDROcodone-acetaminophen (NORCO/VICODIN) 5-325 MG tablet  Every 8 hours PRN        07/29/23 0612             Note:  This document was prepared using Dragon voice recognition software and may include unintentional dictation errors.   Juanluis Guastella, Layla Maw, DO 07/29/23 [phone]

## 2023-07-29 NOTE — ED Triage Notes (Addendum)
Patient reports pain on the right side of the right foot. Presents with redness to the right side of the foot, no swelling, skin is intact. Denies injury or trauma to the foot. States he took Tylenol PM for pain at (11pm and 1am).

## 2023-07-29 NOTE — Discharge Instructions (Addendum)
Your x-ray showed no bony abnormality.  You have no clinical signs of a blood clot in your arteries or veins.  No sign of infection or gout currently.  Symptoms may be caused by tendinitis.  There is no sign of any life-threatening emergency present today.  I recommend keeping your leg elevated when at rest, applying ice several times a day and taking pain medication as needed.  I recommend close follow-up with orthopedics if symptoms or not improving.  You are being provided a prescription for opiates (also known as narcotics) for pain control.  Opiates can be addictive and should only be used when absolutely necessary for pain control when other alternatives do not work.  We recommend you only use them for the recommended amount of time and only as prescribed.  Please do not take with other sedative medications or alcohol.  Please do not drive, operate machinery, make important decisions while taking opiates.  Please note that these medications can be addictive and have high abuse potential.  Patients can become addicted to narcotics after only taking them for a few days.  Please keep these medications locked away from children, teenagers or any family members with history of substance abuse.  Narcotic pain medicine may also make you constipated.  You may use over-the-counter medications such as MiraLAX, Colace to prevent constipation.  If you become constipated, you may use over-the-counter enemas as needed.  Itching and nausea are also common side effects of narcotic pain medication.  If you develop uncontrolled vomiting or a rash, please stop these medications and seek medical care.

## 2023-07-30 ENCOUNTER — Telehealth: Payer: Self-pay | Admitting: Urology

## 2023-07-30 NOTE — Telephone Encounter (Signed)
Pt is scheduled for PTNS# 3 tomorrow.  He went to ER yesterday with red, swollen foot, same foot where PTNS was done.  It also hurts to touch.  Wife wants to know if patient should come in tomorrow and/or continue treatment.

## 2023-07-30 NOTE — Progress Notes (Deleted)
 PTNS  Session # 3/12 weekly PTNS   Health & Social Factors: *** Caffeine: *** Alcohol: *** Daytime voids #per day: *** Night-time voids #per night: *** Urgency: *** Incontinence Episodes #per day: *** Ankle used: *** Treatment Setting: *** Feeling/ Response: *** Comments: ***  Performed By: ***  Follow Up: ***

## 2023-07-31 ENCOUNTER — Ambulatory Visit: Payer: Self-pay | Admitting: Urology

## 2023-08-05 NOTE — Progress Notes (Signed)
PTNS  Session # 3/12 weekly PTNS   Health & Social Factors: No change Caffeine: 0 Alcohol: 0 Daytime voids #per day: 15 Night-time voids #per night: 5 Urgency: mild Incontinence Episodes #per day: 3 Ankle used: right  Treatment Setting: 13 Feeling/ Response: Sensory  Comments: Patient tolerated procedure  Performed By: Michiel Cowboy, PA-C   Follow Up:  1 week for number 4 out of 12 weekly PTNS

## 2023-08-07 ENCOUNTER — Ambulatory Visit (INDEPENDENT_AMBULATORY_CARE_PROVIDER_SITE_OTHER): Payer: Medicare Other | Admitting: Urology

## 2023-08-07 DIAGNOSIS — N3281 Overactive bladder: Secondary | ICD-10-CM

## 2023-08-12 NOTE — Progress Notes (Unsigned)
PTNS  Session # 4/12  Health & Social Factors: No changes Caffeine: 0 Alcohol: 0 Daytime voids #per day: 6 Night-time voids #per night: 3 Urgency: Mild Incontinence Episodes #per day: 0 Ankle used: Right  Treatment Setting: 2 Feeling/ Response: Sensory  Comments: Patient tolerated   Performed By: Michiel Cowboy, PA-C   Follow Up: 1 week for number 5 out of 12 PTNS

## 2023-08-14 ENCOUNTER — Ambulatory Visit (INDEPENDENT_AMBULATORY_CARE_PROVIDER_SITE_OTHER): Payer: Medicare Other | Admitting: Urology

## 2023-08-14 DIAGNOSIS — N3281 Overactive bladder: Secondary | ICD-10-CM | POA: Diagnosis not present

## 2023-08-19 NOTE — Progress Notes (Signed)
 PTNS  Session # 5/12  Health & Social Factors: No change Caffeine: 0 Alcohol: 0 Daytime voids #per day: 5 Night-time voids #per night: 3 Urgency: Mild Incontinence Episodes #per day: 2 Ankle used: Right Treatment Setting: 19 Feeling/ Response: Sensory  Comments: Patient tolerated procedure   Performed By: Michiel Cowboy, PA-C   Follow Up: Follow up next week for #6/12 PTNS

## 2023-08-21 ENCOUNTER — Ambulatory Visit: Payer: Self-pay | Admitting: Urology

## 2023-08-21 DIAGNOSIS — R3129 Other microscopic hematuria: Secondary | ICD-10-CM | POA: Diagnosis not present

## 2023-08-21 DIAGNOSIS — R3915 Urgency of urination: Secondary | ICD-10-CM

## 2023-08-21 DIAGNOSIS — N3281 Overactive bladder: Secondary | ICD-10-CM | POA: Diagnosis not present

## 2023-08-21 LAB — URINALYSIS, COMPLETE
Bilirubin, UA: NEGATIVE
Glucose, UA: NEGATIVE
Ketones, UA: NEGATIVE
Leukocytes,UA: NEGATIVE
Nitrite, UA: NEGATIVE
Protein,UA: NEGATIVE
Specific Gravity, UA: 1.025 (ref 1.005–1.030)
Urobilinogen, Ur: 1 mg/dL (ref 0.2–1.0)
pH, UA: 6 (ref 5.0–7.5)

## 2023-08-21 LAB — MICROSCOPIC EXAMINATION

## 2023-08-21 NOTE — Progress Notes (Signed)
 08/21/2023 8:36 PM   Thomas Mcdaniel [birth year] [id number]  Referring provider: Dione Housekeeper, MD [address] [place],  [place] [address]  Urological history: 1.  Nephrolithiasis -right URS (11/2020)  -CT (11/2020) - bilateral nephrolithiasis  2. High risk hematuria -former smoker -CTU (11/2020) -bilateral nephrolithiasis -cysto (09/2020) - NED  3. BPH with LU TS -PSA (03/2016) 0.40 -Tamsulosin 0.4 mg daily and finasteride 5 mg daily  Chief Complaint  Patient presents with   PTNS    HPI: Thomas Mcdaniel is a 78 y.o. male who presents today for PTNS, but mention that he feels he may have a UTI.  Previous records reviewed.   He has noticed for the last several days that he has had an increase in his urinary urgency and frequency.  He also had some diarrhea associated with it, but that is resolved.  Patient denies any modifying or aggravating factors.  Patient denies any recent UTI's, gross hematuria, dysuria or suprapubic/flank pain.  Patient denies any fevers, chills, nausea or vomiting.    UA yellow clear, specific area 1.025, 1+ blood, pH 6.0, 0-5 WBCs, 11-30 RBCs, 0-10 epithelial cells and a few bacteria  PMH: Past Medical History:  Diagnosis Date   Acquired spondylolisthesis    BPH (benign prostatic hyperplasia)    Bradycardia    Burn    Bursitis    right elbow    Cancer (HCC)    skin cancer   Complication of anesthesia    difficult to wake up after a lithotripsy   DJD (degenerative joint disease)    Foot drop    bil.   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    Nephrolithiasis     Obesity    Peripheral polyneuropathy    Personal history of PE (pulmonary embolism)    PONV (postoperative nausea and vomiting)    after cataract surgery   Pre-diabetes    Scoliosis    Sleep apnea    pt denies   SOB (shortness of breath)    Spinal stenosis, lumbar region without neurogenic claudication    Urine frequency    UTI (urinary tract infection) during pregnancy, unspecified trimester     Surgical History: Past Surgical History:  Procedure Laterality Date   CATARACT EXTRACTION     CATARACT EXTRACTION W/PHACO Right 08/25/2019   Procedure: CATARACT EXTRACTION PHACO AND INTRAOCULAR LENS PLACEMENT (IOC) MALYUGIN RIGHT 7.94  00:48.5;  Surgeon: Galen Manila, MD;  Location: [facility];  Service: Ophthalmology;  Laterality: Right;   COLONOSCOPY WITH PROPOFOL N/A 08/01/2015   Procedure: COLONOSCOPY WITH PROPOFOL;  Surgeon: Elnita Maxwell, MD;  Location: [facility] ENDOSCOPY;  Service: Endoscopy;  Laterality: N/A;   COLONOSCOPY WITH PROPOFOL N/A 09/25/2019   Procedure: COLONOSCOPY WITH PROPOFOL;  Surgeon: Earline Mayotte, MD;  Location: [facility] ENDOSCOPY;  Service: Endoscopy;  Laterality: N/A;   CORONARY PRESSURE/FFR WITH 3D MAPPING N/A 12/07/2021   Procedure: Coronary Pressure Wire/FFR w/3D Mapping;  Surgeon: Marcina Millard, MD;  Location: [facility] INVASIVE CV LAB;  Service: Cardiovascular;  Laterality:  N/A;   CYSTOSCOPY/URETEROSCOPY/HOLMIUM LASER/STENT PLACEMENT Right 12/16/2020   Procedure: CYSTOSCOPY/URETEROSCOPY/HOLMIUM LASER/STENT PLACEMENT;  Surgeon: Sondra Come, MD;  Location: [facility] ORS;  Service: Urology;  Laterality: Right;   DIAGNOSTIC LAPAROSCOPY     FEMORAL ARTERY REPAIR     HERNIA REPAIR     JOINT REPLACEMENT Right    knee   KNEE ARTHROPLASTY Right 12/05/2015   Procedure: COMPUTER ASSISTED TOTAL KNEE ARTHROPLASTY;  Surgeon: Donato Heinz, MD;  Location: [facility] ORS;  Service: Orthopedics;  Laterality: Right;   knee arthroscopy     LAMINECTOMY     LEFT  HEART CATH AND CORONARY ANGIOGRAPHY N/A 12/07/2021   Procedure: LEFT HEART CATH AND CORONARY ANGIOGRAPHY;  Surgeon: Marcina Millard, MD;  Location: [facility] INVASIVE CV LAB;  Service: Cardiovascular;  Laterality: N/A;   LITHOTRIPSY     LITHOTRIPSY     ORIF ANKLE FRACTURE Left 09/24/2022   Procedure: OPEN REDUCTION INTERNAL FIXATION (ORIF) ANKLE FRACTURE, SYNDESMOTIC FIXATION, TALUS RETROGRADE FILLING CYSTIC CHANGES WITHIN THE TALUS WITH BONE GRAFT;  Surgeon: Rosetta Posner, DPM;  Location: [facility] ORS;  Service: Podiatry;  Laterality: Left;   SPINAL FUSION     TOE AMPUTATION     TONSILLECTOMY      Home Medications:  Allergies as of 08/21/2023       Reactions   Haloperidol Other (See Comments)   Dilaudid [hydromorphone]         Medication List        Accurate as of August 21, 2023 11:59 PM. If you have any questions, ask your nurse or doctor.          allopurinol 300 MG tablet Commonly known as: ZYLOPRIM Take 300 mg by mouth daily as needed (gout flare).   diclofenac Sodium 1 % Gel Commonly known as: VOLTAREN Apply 2 g topically 2 (two) times daily.   finasteride 5 MG tablet Commonly known as: PROSCAR Take 1 tablet (5 mg total) by mouth daily.   HYDROcodone-acetaminophen 5-325 MG tablet Commonly known as: NORCO/VICODIN Take 1 tablet by mouth every 8 (eight) hours as needed.   losartan 100 MG tablet Commonly known as: COZAAR Take 100 mg by mouth at bedtime.   memantine 5 MG tablet Commonly known as: NAMENDA Take 5 mg by mouth 2 (two) times daily.   ondansetron 4 MG disintegrating tablet Commonly known as: ZOFRAN-ODT Take 1 tablet (4 mg total) by mouth every 6 (six) hours as needed for nausea or vomiting.   rosuvastatin 5 MG tablet Commonly known as: CRESTOR Take 5 mg by mouth daily.   senna-docusate 8.6-50 MG tablet Commonly known as: Senokot-S Take 1 tablet by mouth 2 (two) times daily. Decrease to 1x daily or hold for diarrhea/loose stools    spironolactone 25 MG tablet Commonly known as: ALDACTONE Take 25 mg by mouth at bedtime.   tamsulosin 0.4 MG Caps capsule Commonly known as: FLOMAX Take 1 capsule (0.4 mg total) by mouth daily.        Allergies:  Allergies  Allergen Reactions   Haloperidol Other (See Comments)   Dilaudid [Hydromorphone]     Family History: Family History  Problem Relation Age of Onset   Prostate cancer Neg Hx    Kidney disease Neg Hx     Social History:  reports that he quit smoking about 31 years ago. His smoking use included cigarettes. He started smoking about 48 years ago. He has a 51 pack-year smoking history. He has been exposed to tobacco smoke. He has never used smokeless  tobacco. He reports that he does not drink alcohol and does not use drugs.  ROS: Pertinent ROS in HPI  Physical Exam: Constitutional:  Well nourished. Alert and oriented, No acute distress. HEENT: [place] AT, moist mucus membranes.  Trachea midline Cardiovascular: No clubbing, cyanosis, or edema. Respiratory: Normal respiratory effort, no increased work of breathing. Neurologic: Grossly intact, no focal deficits, moving all 4 extremities. Psychiatric: Normal mood and affect.  Laboratory Data: Lab Results  Component Value Date   WBC 8.9 09/27/2022   HGB 14.2 09/27/2022   HCT 41.0 09/27/2022   MCV 89.9 09/27/2022   PLT 150 09/27/2022   Lab Results  Component Value Date   CREATININE 0.86 09/27/2022   Lab Results  Component Value Date   AST 35 09/23/2022   Lab Results  Component Value Date   ALT 24 09/23/2022   Urinalysis See EPIC and HPI  I have reviewed the labs.   Pertinent Imaging: N/A  Assessment & Plan:    1. OAB (overactive bladder) (Primary) -Patient currently undergoing PTNS  2. Urgency -He feels that his urgency has worsened as of late -UA w/ micro heme -Urine sent for culture to rule out infection  3. MIcroscopic hematuria -UA with microscopic hematuria -Urine culture  pending -If urine culture is positive, will treat with culture appropriate antibiotics and then retest to ensure hematuria resolves with the treatment of infection -If urine culture is negative, we will discuss the need to having a hematuria workup   Return in about 1 week (around 08/28/2023) for PTNS .  These notes generated with voice recognition software. I apologize for typographical errors.  Cloretta Ned  [facility] [address]  [address] [place], [place] [address] [phone]

## 2023-08-25 ENCOUNTER — Encounter: Payer: Self-pay | Admitting: Urology

## 2023-08-25 LAB — CULTURE, URINE COMPREHENSIVE

## 2023-08-26 NOTE — Progress Notes (Unsigned)
 PTNS  Session # 6/12  Health & Social Factors: No change Caffeine: 0 Alcohol: 0 Daytime voids #per day: 6 Night-time voids #per night: 5 Urgency: Mild Incontinence Episodes #per day: 2 Ankle used: Right Treatment Setting: 10 Feeling/ Response: Sensory Comments: Patient tolerated procedure well  Performed By: Michiel Cowboy, PA-C   Follow Up: 1 week for # 7 out of 12 PTNS

## 2023-08-26 NOTE — Progress Notes (Unsigned)
 08/28/2023 9:50 AM   Thomas Mcdaniel 06-Nov-1945 [id number]  Referring provider: Dione Housekeeper, MD [address] [place],  [place] [address]  Urological history: 1.  Nephrolithiasis -right URS (11/2020)  -CT (11/2020) - bilateral nephrolithiasis  2. High risk hematuria -former smoker -CTU (11/2020) -bilateral nephrolithiasis -cysto (09/2020) - NED  3. BPH with LU TS -PSA (03/2016) 0.40 -Tamsulosin 0.4 mg daily and finasteride 5 mg daily  Chief Complaint  Patient presents with   microscopic hematuria   HPI: Thomas Mcdaniel is a 78 y.o. male who presents today for PTNS, but he had an increase in urinary frequency w/ micro heme on UA with negative culture.   Previous records reviewed.   At his visit on 08/21/2023, he has noticed for the last several days that he has had an increase in his urinary urgency and frequency.  He also had some diarrhea associated with it, but that is resolved.  Patient denies any modifying or aggravating factors.  Patient denies any recent UTI's, gross hematuria, dysuria or suprapubic/flank pain.  Patient denies any fevers, chills, nausea or vomiting.   UA yellow clear, specific area 1.025, 1+ blood, pH 6.0, 0-5 WBCs, 11-30 RBCs, 0-10 epithelial cells and a few bacteria. Urine culture was negative.  He mentions today that he has noticed that his urine had been darker for a few days last week, but then it cleared up.  Patient denies any modifying or aggravating factors.  Patient denies any recent UTI's, gross hematuria, dysuria or suprapubic/flank pain.  Patient denies any fevers, chills, nausea or vomiting.    PMH: Past Medical History:  Diagnosis Date   Acquired spondylolisthesis    BPH (benign prostatic hyperplasia)    Bradycardia    Burn    Bursitis    right elbow    Cancer (HCC)    skin cancer   Complication of anesthesia    difficult to wake up after a lithotripsy   DJD (degenerative joint disease)    Foot drop    bil.    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    Nephrolithiasis    Obesity    Peripheral polyneuropathy    Personal history of PE (pulmonary embolism)    PONV (postoperative nausea and vomiting)    after cataract surgery   Pre-diabetes    Scoliosis    Sleep apnea    pt denies   SOB (shortness of breath)    Spinal stenosis, lumbar region without neurogenic claudication    Urine frequency    UTI (urinary tract infection) during pregnancy, unspecified trimester     Surgical History: Past Surgical History:  Procedure Laterality Date   CATARACT EXTRACTION     CATARACT EXTRACTION W/PHACO Right 08/25/2019   Procedure: CATARACT EXTRACTION PHACO AND INTRAOCULAR LENS PLACEMENT (IOC) MALYUGIN RIGHT 7.94  00:48.5;  Surgeon: Galen Manila, MD;  Location: [facility];  Service: Ophthalmology;  Laterality: Right;   COLONOSCOPY WITH PROPOFOL N/A 08/01/2015   Procedure: COLONOSCOPY WITH PROPOFOL;  Surgeon: Elnita Maxwell, MD;  Location: [facility] ENDOSCOPY;  Service: Endoscopy;  Laterality: N/A;   COLONOSCOPY WITH PROPOFOL N/A 09/25/2019   Procedure: COLONOSCOPY WITH PROPOFOL;  Surgeon: Earline Mayotte, MD;  Location: [facility] ENDOSCOPY;  Service: Endoscopy;  Laterality: N/A;   CORONARY PRESSURE/FFR WITH 3D MAPPING N/A 12/07/2021   Procedure: Coronary Pressure Wire/FFR w/3D Mapping;  Surgeon: Marcina Millard, MD;  Location: [facility];  Service: Cardiovascular;  Laterality: N/A;   CYSTOSCOPY/URETEROSCOPY/HOLMIUM LASER/STENT PLACEMENT Right 12/16/2020   Procedure: CYSTOSCOPY/URETEROSCOPY/HOLMIUM LASER/STENT PLACEMENT;  Surgeon:  Sondra Come, MD;  Location: [facility]S;  Service: Urology;  Laterality: Right;   DIAGNOSTIC LAPAROSCOPY     FEMORAL ARTERY REPAIR     HERNIA REPAIR     JOINT REPLACEMENT Right    knee   KNEE ARTHROPLASTY Right 12/05/2015   Procedure: COMPUTER ASSISTED TOTAL KNEE ARTHROPLASTY;  Surgeon: Donato Heinz, MD;  Location: [facility];  Service: Orthopedics;  Laterality: Right;   knee arthroscopy     LAMINECTOMY     LEFT HEART CATH AND CORONARY ANGIOGRAPHY N/A 12/07/2021   Procedure: LEFT HEART CATH AND CORONARY ANGIOGRAPHY;  Surgeon: Marcina Millard, MD;  Location: [facility];  Service: Cardiovascular;  Laterality: N/A;   LITHOTRIPSY     LITHOTRIPSY     ORIF ANKLE FRACTURE Left 09/24/2022   Procedure: OPEN REDUCTION INTERNAL FIXATION (ORIF) ANKLE FRACTURE, SYNDESMOTIC FIXATION, TALUS RETROGRADE FILLING CYSTIC CHANGES WITHIN THE TALUS WITH BONE GRAFT;  Surgeon: Rosetta Posner, DPM;  Location: [facility] ORS;  Service: Podiatry;  Laterality: Left;   SPINAL FUSION     TOE AMPUTATION     TONSILLECTOMY      Home Medications:  Allergies as of 08/28/2023       Reactions   Haloperidol Other (See Comments)   Dilaudid [hydromorphone]         Medication List        Accurate as of August 28, 2023  9:50 AM. If you have any questions, ask your nurse or doctor.          allopurinol 300 MG tablet Commonly known as: ZYLOPRIM Take 300 mg by mouth daily as needed (gout flare).   diclofenac Sodium 1 % Gel Commonly known as: VOLTAREN Apply 2 g topically 2 (two) times daily.   finasteride 5 MG tablet Commonly known as: PROSCAR Take 1 tablet (5 mg total) by mouth daily.   HYDROcodone-acetaminophen 5-325 MG tablet Commonly known as: NORCO/VICODIN Take 1 tablet by mouth every 8 (eight) hours as needed.   losartan 100 MG tablet Commonly known as: COZAAR Take 100 mg by mouth at bedtime.   memantine 5 MG tablet Commonly known as: NAMENDA Take 5 mg by mouth 2 (two) times daily.    ondansetron 4 MG disintegrating tablet Commonly known as: ZOFRAN-ODT Take 1 tablet (4 mg total) by mouth every 6 (six) hours as needed for nausea or vomiting.   rosuvastatin 5 MG tablet Commonly known as: CRESTOR Take 5 mg by mouth daily.   senna-docusate 8.6-50 MG tablet Commonly known as: Senokot-S Take 1 tablet by mouth 2 (two) times daily. Decrease to 1x daily or hold for diarrhea/loose stools   spironolactone 25 MG tablet Commonly known as: ALDACTONE Take 25 mg by mouth at bedtime.   tamsulosin 0.4 MG Caps capsule Commonly known as: FLOMAX Take 1 capsule (0.4 mg total) by mouth daily.        Allergies:  Allergies  Allergen Reactions   Haloperidol Other (See Comments)   Dilaudid [Hydromorphone]  Family History: Family History  Problem Relation Age of Onset   Prostate cancer Neg Hx    Kidney disease Neg Hx     Social History:  reports that he quit smoking about 31 years ago. His smoking use included cigarettes. He started smoking about 48 years ago. He has a 51 pack-year smoking history. He has been exposed to tobacco smoke. He has never used smokeless tobacco. He reports that he does not drink alcohol and does not use drugs.  ROS: Pertinent ROS in HPI  Physical Exam: Constitutional:  Well nourished. Alert and oriented, No acute distress. HEENT: [place] AT, moist mucus membranes.  Trachea midline Cardiovascular: No clubbing, cyanosis, or edema. Respiratory: Normal respiratory effort, no increased work of breathing. Neurologic: Grossly intact, no focal deficits, moving all 4 extremities. Psychiatric: Normal mood and affect.   Laboratory Data: N/A   Pertinent Imaging: N/A  Assessment & Plan:    1. OAB (overactive bladder) (Primary) -Patient currently undergoing PTNS  2. Urgency -He feels that his urgency has worsened as of late -UA w/ micro heme -Urine culture was negative   3. MIcroscopic hematuria -UA with microscopic hematuria -Urine culture  was negative  -If urine culture was negative, we discussed the AUA recommendations for repeat hematuria workup every 2 to 5 years with episodes of persistent microscopic hematuria, he has had instances with pain secondary to stones, so he like to pursue a workup at this time -Will schedule CT urogram and repeat cystoscopy with Dr. Richardo Hanks   Return for CT urogram report and cysto for persistent micro heme w/ Dr. Richardo Hanks .  These notes generated with voice recognition software. I apologize for typographical errors.  Cloretta Ned  [facility] [address]  [address] [place], [place] [address] [phone]

## 2023-08-28 ENCOUNTER — Ambulatory Visit: Payer: Self-pay | Admitting: Urology

## 2023-08-28 ENCOUNTER — Encounter: Payer: Self-pay | Admitting: Urology

## 2023-08-28 DIAGNOSIS — N3281 Overactive bladder: Secondary | ICD-10-CM

## 2023-08-28 DIAGNOSIS — R3129 Other microscopic hematuria: Secondary | ICD-10-CM | POA: Diagnosis not present

## 2023-08-28 DIAGNOSIS — R3915 Urgency of urination: Secondary | ICD-10-CM

## 2023-09-02 NOTE — Progress Notes (Unsigned)
 PTNS  Session # 7/12  Health & Social Factors: No change Caffeine: 0 Alcohol: 0 Daytime voids #per day: 8 Night-time voids #per night: 5 Urgency: Mild Incontinence Episodes #per day: 0 Ankle used: Right  Treatment Setting: 12 Feeling/ Response: Sensory Comments: Patient tolerated well  Performed By: Michiel Cowboy, PA-C   Follow Up: Return in one week for #8/12

## 2023-09-04 ENCOUNTER — Ambulatory Visit (INDEPENDENT_AMBULATORY_CARE_PROVIDER_SITE_OTHER): Payer: Medicare Other | Admitting: Urology

## 2023-09-04 DIAGNOSIS — N3281 Overactive bladder: Secondary | ICD-10-CM | POA: Diagnosis not present

## 2023-09-09 NOTE — Progress Notes (Unsigned)
 PTNS  Session # 8/12  Health & Social Factors: No changes Caffeine: None Alcohol: None Daytime voids #per day: 6 Night-time voids #per night: 4 Urgency: Mild Incontinence Episodes #per day: None Ankle used: right  Treatment Setting: 14 Feeling/ Response: Sensory Comments: Patient tolerated procedure  Performed By: Michiel Cowboy, PA-C   Follow Up: 1 week for number 9 out of 12 PTNS

## 2023-09-11 ENCOUNTER — Ambulatory Visit (INDEPENDENT_AMBULATORY_CARE_PROVIDER_SITE_OTHER): Payer: Medicare Other | Admitting: Urology

## 2023-09-11 ENCOUNTER — Encounter: Payer: Self-pay | Admitting: Urology

## 2023-09-11 VITALS — BP 140/79 | HR 60 | Ht 69.0 in

## 2023-09-11 DIAGNOSIS — N3281 Overactive bladder: Secondary | ICD-10-CM

## 2023-09-16 ENCOUNTER — Ambulatory Visit
Admission: RE | Admit: 2023-09-16 | Discharge: 2023-09-16 | Disposition: A | Discharge status: Home / Self Care | Source: Ambulatory Visit | Attending: Urology | Admitting: Urology

## 2023-09-16 DIAGNOSIS — R3129 Other microscopic hematuria: Secondary | ICD-10-CM | POA: Insufficient documentation

## 2023-09-16 LAB — POCT I-STAT CREATININE: Creatinine, Ser: 1.3 mg/dL — ABNORMAL HIGH (ref 0.61–1.24)

## 2023-09-16 MED ORDER — IOHEXOL 300 MG/ML  SOLN
100.0000 mL | Freq: Once | INTRAMUSCULAR | Status: AC | PRN
  Administered 2023-09-16: 100 mL via INTRAVENOUS

## 2023-09-17 NOTE — Progress Notes (Unsigned)
 PTNS  Session # 9/12  Health & Social Factors: No change Caffeine: 0 Alcohol: 0 Daytime voids #per day: 6-8 Night-time voids #per night: 4 Urgency: mild Incontinence Episodes #per day: 0 Ankle used: right Treatment Setting: 19 Feeling/ Response: Sensory  Comments: Patient tolerated   Performed By: Michiel Cowboy, PA-C   Follow Up: 1 week for number 10 out of 12 PTNS.  I explained that his CT urogram report from radiology is not available yet at this time and we will contact him once the radiologist gives their interpretation.  This can take typically 2 to 3 weeks, unless there is something urgent.  He also has an appointment on Monday to go over the CT with Dr. Richardo Hanks and a repeat cystoscopy for worsening hematuria.

## 2023-09-18 ENCOUNTER — Ambulatory Visit (INDEPENDENT_AMBULATORY_CARE_PROVIDER_SITE_OTHER): Payer: Self-pay | Admitting: Urology

## 2023-09-18 DIAGNOSIS — N3281 Overactive bladder: Secondary | ICD-10-CM | POA: Diagnosis not present

## 2023-09-23 ENCOUNTER — Encounter: Payer: Self-pay | Admitting: Urology

## 2023-09-23 ENCOUNTER — Ambulatory Visit (INDEPENDENT_AMBULATORY_CARE_PROVIDER_SITE_OTHER): Admitting: Urology

## 2023-09-23 VITALS — BP 151/89 | HR 68 | Ht 69.0 in | Wt 240.0 lb

## 2023-09-23 DIAGNOSIS — R399 Unspecified symptoms and signs involving the genitourinary system: Secondary | ICD-10-CM

## 2023-09-23 DIAGNOSIS — R3121 Asymptomatic microscopic hematuria: Secondary | ICD-10-CM

## 2023-09-23 DIAGNOSIS — R3129 Other microscopic hematuria: Secondary | ICD-10-CM | POA: Diagnosis not present

## 2023-09-23 NOTE — Progress Notes (Signed)
 Cystoscopy Procedure Note:  Indication: Microscopic hematuria  Comorbid and frail 78 year old male with long history of urinary symptoms including frequency, urgency, urge incontinence, nocturia.  He has been on Flomax and finasteride long-term.  Previously also trialed on OAB medications with unclear improvement.  He is a challenging historian, also has increased frailty and wheelchair-bound making getting to the bathroom difficult, continues to consume high volumes of soda and tea which exacerbate his urinary symptoms.  He also has a history of nephrolithiasis requiring ureteroscopy in June 2022 prostate was small and wide open at that time with no evidence of obstruction  He has been undergoing PTNS over the last few months, on session #9/12  After informed consent and discussion of the procedure and its risks, FINNIGAN WARRINER was positioned and prepped in the standard fashion. Cystoscopy was performed with a flexible cystoscope. The urethra, bladder neck and entire bladder was visualized in a standard fashion. The prostate was small. The ureteral orifices were visualized in their normal location and orientation.  Bladder mucosa grossly normal throughout, no abnormalities on retroflexion  Imaging: I personally viewed the CT urogram showing nonobstructive left renal stones, no evidence of mass or filling defect, formal read pending  Findings: Normal cystoscopy   Assessment and Plan: Continue PTNS for OAB, he has noticed moderate improvement thus far  Continue Flomax and finasteride  Would only repeat cystoscopy/CT if gross hematuria  Legrand Rams, MD 09/23/2023

## 2023-09-23 NOTE — Progress Notes (Unsigned)
 PTNS  Session # 10/12  Health & Social Factors: No change Caffeine: 0 Alcohol: 0 Daytime voids #per day: 8 Night-time voids #per night: 4 Urgency: Mild Incontinence Episodes #per day: 0 Ankle used: Right  Treatment Setting: 18 Feeling/ Response: Sensory  Comments: Patient tolerated.   Performed By: Michiel Cowboy, PA-C   Follow Up: One week for #11/12

## 2023-09-25 ENCOUNTER — Ambulatory Visit (INDEPENDENT_AMBULATORY_CARE_PROVIDER_SITE_OTHER): Payer: Self-pay | Admitting: Urology

## 2023-09-25 DIAGNOSIS — R399 Unspecified symptoms and signs involving the genitourinary system: Secondary | ICD-10-CM | POA: Diagnosis not present

## 2023-10-01 ENCOUNTER — Ambulatory Visit: Payer: Medicare Other | Admitting: Urology

## 2023-10-01 NOTE — Progress Notes (Unsigned)
 PTNS   Session # 11/12   Health & Social Factors: no change Caffeine: 0 Alcohol: 0 Daytime voids #per day: 5-6 Night-time voids #per night: 3-4 Urgency: mild Incontinence Episodes #per day: 0 Ankle used: right Treatment Setting: 4 Feeling/ Response: sensory Comments: Patient tolerated   Performed By: Benay Pike CMA   Follow Up: one week

## 2023-10-02 ENCOUNTER — Ambulatory Visit (INDEPENDENT_AMBULATORY_CARE_PROVIDER_SITE_OTHER): Payer: Self-pay | Admitting: Urology

## 2023-10-02 DIAGNOSIS — N3281 Overactive bladder: Secondary | ICD-10-CM

## 2023-10-02 NOTE — Patient Instructions (Signed)

## 2023-10-02 NOTE — Progress Notes (Signed)
 PTNS   Session # 11/12   Health & Social Factors: no change Caffeine: 0 Alcohol: 0 Daytime voids #per day: 5-6 Night-time voids #per night: 3-4 Urgency: mild Incontinence Episodes #per day: 0 Ankle used: right Treatment Setting: 4 Feeling/ Response: sensory Comments: Patient tolerated   Performed By: Benay Pike CMA   Follow Up: one week

## 2023-10-09 ENCOUNTER — Ambulatory Visit (INDEPENDENT_AMBULATORY_CARE_PROVIDER_SITE_OTHER): Payer: Medicare Other | Admitting: Physician Assistant

## 2023-10-09 DIAGNOSIS — N3281 Overactive bladder: Secondary | ICD-10-CM

## 2023-10-09 NOTE — Patient Instructions (Signed)

## 2023-10-09 NOTE — Progress Notes (Addendum)
 PTNS  Session # 12 (INCOMPLETE)  Health & Social Factors: No change Caffeine: 0 Alcohol: 0 Daytime voids #per day: 7-8 Night-time voids #per night: 2-3 Urgency: Mild Incontinence Episodes #per day: 0 Ankle used: right Treatment Setting: 19 Feeling/ Response: sensory Comments: Treatment stopped early today due to an episode of urge incontinence. Will reschedule for makeup session next week. Will NOT charge for this week; will charge for makeup session next week.  Performed By: Dorsie Gaunt  Follow Up: 1 week

## 2023-10-15 NOTE — Progress Notes (Unsigned)
 PTNS  Session # 12/12  Health & Social Factors: no change Caffeine: 0 Alcohol: 0 Daytime voids #per day: 7-8 Night-time voids #per night: 4-5 Urgency: mild Incontinence Episodes #per day: 0 Ankle used: right Treatment Setting: 15 Feeling/ Response: sensory Comments: Patient tolerated  Performed By: Jenny Mohs, CMA  Follow Up: May 14,2025

## 2023-10-16 ENCOUNTER — Ambulatory Visit (INDEPENDENT_AMBULATORY_CARE_PROVIDER_SITE_OTHER): Admitting: Urology

## 2023-10-16 DIAGNOSIS — N3281 Overactive bladder: Secondary | ICD-10-CM | POA: Diagnosis not present

## 2023-10-30 ENCOUNTER — Ambulatory Visit: Payer: Self-pay | Admitting: Urology

## 2023-11-06 ENCOUNTER — Encounter: Payer: Self-pay | Admitting: Urology

## 2023-11-06 ENCOUNTER — Ambulatory Visit
Admission: RE | Admit: 2023-11-06 | Discharge: 2023-11-06 | Disposition: A | Discharge status: Home / Self Care | Source: Ambulatory Visit | Attending: Urology | Admitting: Urology

## 2023-11-06 ENCOUNTER — Ambulatory Visit (INDEPENDENT_AMBULATORY_CARE_PROVIDER_SITE_OTHER): Payer: Medicare Other | Admitting: Urology

## 2023-11-06 VITALS — BP 159/94 | HR 66 | Ht 69.0 in | Wt 240.0 lb

## 2023-11-06 DIAGNOSIS — R3129 Other microscopic hematuria: Secondary | ICD-10-CM

## 2023-11-06 DIAGNOSIS — N3941 Urge incontinence: Secondary | ICD-10-CM | POA: Diagnosis not present

## 2023-11-06 DIAGNOSIS — N200 Calculus of kidney: Secondary | ICD-10-CM | POA: Diagnosis present

## 2023-11-06 DIAGNOSIS — N3281 Overactive bladder: Secondary | ICD-10-CM

## 2023-11-06 LAB — URINALYSIS, COMPLETE
Bilirubin, UA: NEGATIVE
Glucose, UA: NEGATIVE
Ketones, UA: NEGATIVE
Leukocytes,UA: NEGATIVE
Nitrite, UA: NEGATIVE
Protein,UA: NEGATIVE
Specific Gravity, UA: <1.005 — ABNORMAL LOW (ref 1.005–1.030)
Urobilinogen, Ur: 0.2 mg/dL (ref 0.2–1.0)
pH, UA: 6 (ref 5.0–7.5)

## 2023-11-06 LAB — MICROSCOPIC EXAMINATION

## 2023-11-06 LAB — BLADDER SCAN AMB NON-IMAGING: Scan Result: 22

## 2023-11-06 MED ORDER — TROSPIUM CHLORIDE 20 MG PO TABS: 20.0000 mg | ORAL_TABLET | Freq: Two times a day (BID) | ORAL | 3 refills | Status: DC

## 2023-11-06 MED ORDER — MIRABEGRON ER 50 MG PO TB24: 50.0000 mg | ORAL_TABLET | Freq: Every day | ORAL | 11 refills | Status: DC

## 2023-11-06 NOTE — Progress Notes (Addendum)
 11/06/2023 11:32 AM   Thomas Mcdaniel 1946/04/13 [id number]  Referring provider: Baltazar Leventhal, MD [address] [place],  [place] [address]  Urological history: 1.  Nephrolithiasis - CTU (08/2023) - bilateral non obstructing renal calculi - right URS (2022)  2. BPH with LU TS - PSA (2017) 0.40, aged out of screening - Tamsulosin  0.4 mg daily - Finasteride  5 mg daily  3. Incontinence - Failed anticholinergics - Failed Myrbetriq   Chief Complaint  Patient presents with   Over Active Bladder   HPI: Thomas Mcdaniel is a 78 y.o. man who presents today for 1 month follow-up after the completion of 12 weekly PTNS with his wife, [name].   Previous records reviewed.   He states his symptoms have never been like this ever before.  He states he got up 10 times last night to void and he is going 8-10 times during the day.  He is also having incontinence without awareness.  His wife states that the symptoms have been present since before he started the PTNS.  Patient denies any modifying or aggravating factors.  Patient denies any recent UTI's, gross hematuria, dysuria or suprapubic/flank pain.  Patient denies any fevers, chills, nausea or vomiting.    His wife states he has not been taking his medications correctly.    Today's UA is yellow clear, specific gravity less than 1.005, pH 6.0, trace heme, 0-5 WBCs, 3-10 RBC's, 0-10 epithelial cells and few bacteria.    KUB demonstrates stable right and left renal stones.  No obstructing stones are noted.  PVR 22 mL   He denies any incontinence of stool or saddle paresthesia.     PMH: Past Medical History:  Diagnosis Date   Acquired spondylolisthesis    BPH (benign prostatic hyperplasia)    Bradycardia    Burn    Bursitis    right elbow    Cancer (HCC)    skin cancer   Complication of anesthesia    difficult to wake up after a lithotripsy   DJD (degenerative joint disease)    Foot drop    bil.   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    Nephrolithiasis    Obesity    Peripheral polyneuropathy    Personal history of PE (pulmonary embolism)    PONV (postoperative nausea and vomiting)    after cataract surgery   Pre-diabetes    Scoliosis    Sleep apnea    pt denies   SOB (shortness of breath)    Spinal stenosis, lumbar region without neurogenic claudication    Urine frequency    UTI (urinary tract infection) during pregnancy, unspecified trimester     Surgical History: Past Surgical History:  Procedure Laterality Date   CATARACT EXTRACTION     CATARACT EXTRACTION W/PHACO Right 08/25/2019   Procedure: CATARACT EXTRACTION PHACO AND INTRAOCULAR LENS PLACEMENT (IOC) MALYUGIN RIGHT 7.94  00:48.5;  Surgeon: Clair Crews, MD;  Location: [facility];  Service: Ophthalmology;  Laterality: Right;   COLONOSCOPY WITH PROPOFOL  N/A 08/01/2015  Procedure: COLONOSCOPY WITH PROPOFOL ;  Surgeon: Luella Sager, MD;  Location: [facility] ENDOSCOPY;  Service: Endoscopy;  Laterality: N/A;   COLONOSCOPY WITH PROPOFOL  N/A 09/25/2019   Procedure: COLONOSCOPY WITH PROPOFOL ;  Surgeon: Marshall Skeeter, MD;  Location: [facility] ENDOSCOPY;  Service: Endoscopy;  Laterality: N/A;   CORONARY PRESSURE/FFR WITH 3D MAPPING N/A 12/07/2021   Procedure: Coronary Pressure Wire/FFR w/3D Mapping;  Surgeon: Percival Brace, MD;  Location: [facility] IN[facility]ASIVE CV LAB;  Service: Cardiovascular;  Laterality: N/A;   CYSTOSCOPY/URETEROSCOPY/HOLMIUM LASER/STENT PLACEMENT Right 12/16/2020   Procedure: CYSTOSCOPY/URETEROSCOPY/HOLMIUM LASER/STENT PLACEMENT;  Surgeon:  Lawerence Pressman, MD;  Location: [facility] ORS;  Service: Urology;  Laterality: Right;   DIAGNOSTIC LAPAROSCOPY     FEMORAL ARTERY REPAIR     HERNIA REPAIR     JOINT REPLACEMENT Right    knee   KNEE ARTHROPLASTY Right 12/05/2015   Procedure: COMPUTER ASSISTED TOTAL KNEE ARTHROPLASTY;  Surgeon: Arlyne Lame, MD;  Location: [facility] ORS;  Service: Orthopedics;  Laterality: Right;   knee arthroscopy     LAMINECTOMY     LEFT HEART CATH AND CORONARY ANGIOGRAPHY N/A 12/07/2021   Procedure: LEFT HEART CATH AND CORONARY ANGIOGRAPHY;  Surgeon: Percival Brace, MD;  Location: [facility];  Service: Cardiovascular;  Laterality: N/A;   LITHOTRIPSY     LITHOTRIPSY     ORIF ANKLE FRACTURE Left 09/24/2022   Procedure: OPEN REDUCTION INTERNAL FIXATION (ORIF) ANKLE FRACTURE, SYNDESMOTIC FIXATION, TALUS RETROGRADE FILLING CYSTIC CHANGES WITHIN THE TALUS WITH BONE GRAFT;  Surgeon: Pink Bridges, DPM;  Location: [facility];  Service: Podiatry;  Laterality: Left;   SPINAL FUSION     TOE AMPUTATION     TONSILLECTOMY      Home Medications:  Allergies as of 11/06/2023       Reactions   Haloperidol  Other (See Comments)   Dilaudid  [hydromorphone ]         Medication List        Accurate as of Nov 06, 2023 11:32 AM. If you have any questions, ask your nurse or doctor.          allopurinol 300 MG tablet Commonly known as: ZYLOPRIM Take 300 mg by mouth daily as needed (gout flare).   diclofenac Sodium 1 % Gel Commonly known as: VOLTAREN Apply 2 g topically 2 (two) times daily.   finasteride  5 MG tablet Commonly known as: PROSCAR  Take 1 tablet (5 mg total) by mouth daily.   losartan  100 MG tablet Commonly known as: COZAAR  Take 100 mg by mouth at bedtime.   memantine  5 MG tablet Commonly known as: NAMENDA  Take 5 mg by mouth 2 (two) times daily.   mirabegron  ER 50 MG Tb24 tablet Commonly known as: MYRBETRIQ  Take 1 tablet (50 mg total) by mouth daily. Started by: Matilde Son    rosuvastatin  5 MG tablet Commonly known as: CRESTOR  Take 5 mg by mouth daily.   senna-docusate 8.6-50 MG tablet Commonly known as: Senokot-S Take 1 tablet by mouth 2 (two) times daily. Decrease to 1x daily or hold for diarrhea/loose stools   spironolactone  25 MG tablet Commonly known as: ALDACTONE  Take 25 mg by mouth at bedtime.   tamsulosin  0.4 MG Caps capsule Commonly known as: FLOMAX  Take 1 capsule (0.4 mg total) by mouth daily.   trospium  20 MG tablet Commonly known as: SANCTURA  Take 1 tablet (20 mg total) by mouth 2 (two) times daily. Started by: Matilde Son        Allergies:  Allergies  Allergen Reactions  Haloperidol  Other (See Comments)   Dilaudid  [Hydromorphone ]     Family History: Family History  Problem Relation Age of Onset   Prostate cancer Neg Hx    Kidney disease Neg Hx     Social History:  reports that he quit smoking about 31 years ago. His smoking use included cigarettes. He started smoking about 48 years ago. He has a 51 pack-year smoking history. He has been exposed to tobacco smoke. He has never used smokeless tobacco. He reports that he does not drink alcohol and does not use drugs.  ROS: Pertinent ROS in HPI  Physical Exam: BP (!) 159/94   Pulse 66   Ht 5\' 9"  (1.753 m)   Wt 240 lb (108.9 kg)   BMI 35.44 kg/m   Constitutional:  Well nourished. Alert and oriented, No acute distress. HEENT: [place] AT, moist mucus membranes.  Trachea midline Cardiovascular: No clubbing, cyanosis, or edema. Respiratory: Normal respiratory effort, no increased work of breathing. Neurologic: Grossly intact, no focal deficits, moving all 4 extremities. Psychiatric: Normal mood and affect.  Laboratory Data: Lab Results  Component Value Date   CREATININE 1.30 (H) 09/16/2023   Urinalysis See EPIC and HPI  I have reviewed the labs.   Pertinent Imaging: Results for orders placed or performed in visit on 11/06/23  Bladder Scan (Post Void Residual) in  office   Collection Time: 11/06/23 10:13 AM  Result Value Ref Range   Scan Result 22     KUB with stable bilateral stones I have independently reviewed the films.  Radiology interpretation still pending  Assessment & Plan:    1. Incontinence - PVR demonstrates adequate emptying - The patient is quite exasperated with his incontinence stating that it is worse than it had been before.   His wife states that this is his baseline.  She is also concerned that he does not take his medications as prescribed.  I have prescribed both Sanctura  IR 20 mg twice daily and Myrbetriq  50 mg daily.  I have also put a referral in to [facility] urology for second opinion.  I explained that his urine did not look infected, but I am going to send it for culture to rule out any indolent infection.  I also ordered a BMP to check electrolytes to ensure his renal function has not diminished or any other electrolyte abnormality to explain his urinary symptoms.  He is KUB also did not demonstrate any distal ureteral stones that would explain his urinary symptoms.  I also discussed an incontinence clamp and/or the use of condom catheters, he deferred both of these options, I still gave information on his AVS. - Follow-up pending urine culture results and BMP  2. Microscopic hematuria - Patient recently underwent a hematuria workup and it was positive for bilateral nephrolithiasis -We will continue to monitor   Return for pending lab results .  These notes generated with voice recognition software. I apologize for typographical errors.  Briant Camper  [facility] [address]  [address] [place], [place] [address] [phone]

## 2023-11-06 NOTE — Patient Instructions (Addendum)
 Thomas Mcdaniel

## 2023-11-07 ENCOUNTER — Ambulatory Visit: Payer: Self-pay | Admitting: Urology

## 2023-11-07 LAB — BASIC METABOLIC PANEL WITH GFR
BUN/Creatinine Ratio: 13 (ref 10–24)
BUN: 13 mg/dL (ref 8–27)
CO2: 23 mmol/L (ref 20–29)
Calcium: 9.9 mg/dL (ref 8.6–10.2)
Chloride: 105 mmol/L (ref 96–106)
Creatinine, Ser: 0.98 mg/dL (ref 0.76–1.27)
Glucose: 105 mg/dL — ABNORMAL HIGH (ref 70–99)
Potassium: 4.9 mmol/L (ref 3.5–5.2)
Sodium: 141 mmol/L (ref 134–144)
eGFR: 80 mL/min/{1.73_m2} (ref >59)

## 2023-11-13 LAB — CULTURE, URINE COMPREHENSIVE

## 2023-11-13 MED ORDER — AMOXICILLIN 875 MG PO TABS: 875.0000 mg | ORAL_TABLET | Freq: Two times a day (BID) | ORAL | 0 refills | Status: DC

## 2023-11-13 MED ORDER — CIPROFLOXACIN HCL 250 MG PO TABS: 250.0000 mg | ORAL_TABLET | Freq: Two times a day (BID) | ORAL | 0 refills | Status: DC

## 2023-12-03 NOTE — Progress Notes (Unsigned)
 12/04/2023 11:50 AM   Thomas Mcdaniel 15-Aug-1945 [id number]  Referring provider: Baltazar Leventhal, MD [address] [place],  [place] [address]  Urological history: 1.  Nephrolithiasis - CTU (08/2023) - Bilateral nonobstructing renal calculi - right URS (2022)   2. 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 Balanoposthitis  4. High risk hematuria - former smoker - CTU (08/2023) bilateral nephrolithiasis - cysto (08/2023) NED  5. Incontinence - Failed anticholinergics - Failed Myrbetriq  - Failed PTNS  No chief complaint on file.  HPI: Thomas Mcdaniel is a 78 y.o. man who presents today for one month follow up.   Previous records reviewed.   At his follow-up visit on Nov 06, 2023, he was exasperated with his urinary symptoms.  He had just completed his 12 weekly PTNS treatments that he felt that they gave him no benefit.  He was having urinary frequency, urgency both day and night.  His urine culture was positive for Enterococcus faecalis and Pseudomonas aeruginosa.  He was treated with culture appropriate antibiotics.  I also put in a referral to [facility] urology for second opinion per patient's request.  His basic metabolic panel was normal.  He also recently had a hemoglobin A1c which was 6.0.   I PSS ***    Score:  1-7 Mild 8-19 Moderate 20-35 Severe   PMH: Past Medical History:  Diagnosis Date   Acquired spondylolisthesis    BPH (benign prostatic hyperplasia)    Bradycardia    Burn    Bursitis    right elbow    Cancer (HCC)    skin cancer   Complication of anesthesia    difficult to wake up after a lithotripsy   DJD (degenerative joint disease)    Foot drop    bil.   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    Nephrolithiasis    Obesity    Peripheral polyneuropathy    Personal history of PE (pulmonary embolism)    PONV (postoperative nausea and vomiting)    after cataract surgery   Pre-diabetes    Scoliosis    Sleep apnea    pt denies   SOB (shortness of breath)    Spinal stenosis, lumbar region without neurogenic claudication    Urine frequency    UTI (urinary tract infection) during pregnancy, unspecified trimester     Surgical History: Past Surgical History:  Procedure Laterality Date   CATARACT EXTRACTION     CATARACT EXTRACTION W/PHACO Right 08/25/2019   Procedure: CATARACT EXTRACTION PHACO AND INTRAOCULAR LENS PLACEMENT (IOC) MALYUGIN RIGHT 7.94  00:48.5;  Surgeon: Clair Crews, MD;  Location: [facility];  Service: Ophthalmology;  Laterality: Right;   COLONOSCOPY WITH PROPOFOL  N/A 08/01/2015   Procedure: COLONOSCOPY WITH PROPOFOL ;  Surgeon: Luella Sager, MD;  Location: [facility] ENDOSCOPY;  Service: Endoscopy;  Laterality: N/A;   COLONOSCOPY WITH PROPOFOL  N/A 09/25/2019   Procedure: COLONOSCOPY WITH PROPOFOL ;  Surgeon: Marshall Skeeter, MD;  Location: [facility] ENDOSCOPY;  Service: Endoscopy;  Laterality: N/A;  CORONARY PRESSURE/FFR WITH 3D MAPPING N/A 12/07/2021   Procedure: Coronary Pressure Wire/FFR w/3D Mapping;  Surgeon: Percival Brace, MD;  Location: [facility];  Service: Cardiovascular;  Laterality: N/A;   CYSTOSCOPY/URETEROSCOPY/HOLMIUM LASER/STENT PLACEMENT Right 12/16/2020   Procedure: CYSTOSCOPY/URETEROSCOPY/HOLMIUM LASER/STENT PLACEMENT;  Surgeon: Lawerence Pressman, MD;  Location: [facility] ORS;  Service: Urology;  Laterality: Right;   DIAGNOSTIC LAPAROSCOPY     FEMORAL ARTERY REPAIR     HERNIA REPAIR     JOINT REPLACEMENT Right    knee   KNEE ARTHROPLASTY Right 12/05/2015   Procedure: COMPUTER ASSISTED TOTAL KNEE  ARTHROPLASTY;  Surgeon: Arlyne Lame, MD;  Location: [facility];  Service: Orthopedics;  Laterality: Right;   knee arthroscopy     LAMINECTOMY     LEFT HEART CATH AND CORONARY ANGIOGRAPHY N/A 12/07/2021   Procedure: LEFT HEART CATH AND CORONARY ANGIOGRAPHY;  Surgeon: Percival Brace, MD;  Location: [facility];  Service: Cardiovascular;  Laterality: N/A;   LITHOTRIPSY     LITHOTRIPSY     ORIF ANKLE FRACTURE Left 09/24/2022   Procedure: OPEN REDUCTION INTERNAL FIXATION (ORIF) ANKLE FRACTURE, SYNDESMOTIC FIXATION, TALUS RETROGRADE FILLING CYSTIC CHANGES WITHIN THE TALUS WITH BONE GRAFT;  Surgeon: Pink Bridges, DPM;  Location: [facility] OR[facility];  Service: Podiatry;  Laterality: Left;   SPINAL FUSION     TOE AMPUTATION     TONSILLECTOMY      Home Medications:  Allergies as of 12/04/2023       Reactions   Haloperidol  Other (See Comments)   Dilaudid  [hydromorphone ]         Medication List        Accurate as of December 03, 2023 11:50 AM. If you have any questions, ask your nurse or doctor.          allopurinol 300 MG tablet Commonly known as: ZYLOPRIM Take 300 mg by mouth daily as needed (gout flare).   amoxicillin  875 MG tablet Commonly known as: AMOXIL  Take 1 tablet (875 mg total) by mouth every 12 (twelve) hours.   ciprofloxacin  250 MG tablet Commonly known as: Cipro  Take 1 tablet (250 mg total) by mouth 2 (two) times daily.   diclofenac Sodium 1 % Gel Commonly known as: VOLTAREN Apply 2 g topically 2 (two) times daily.   finasteride  5 MG tablet Commonly known as: PROSCAR  Take 1 tablet (5 mg total) by mouth daily.   losartan  100 MG tablet Commonly known as: COZAAR  Take 100 mg by mouth at bedtime.   memantine  5 MG tablet Commonly known as: NAMENDA  Take 5 mg by mouth 2 (two) times daily.   mirabegron  ER 50 MG Tb24 tablet Commonly known as: MYRBETRIQ  Take 1 tablet (50 mg total) by mouth daily.   rosuvastatin  5 MG tablet Commonly known as: CRESTOR  Take 5 mg  by mouth daily.   senna-docusate 8.6-50 MG tablet Commonly known as: Senokot-S Take 1 tablet by mouth 2 (two) times daily. Decrease to 1x daily or hold for diarrhea/loose stools   spironolactone  25 MG tablet Commonly known as: ALDACTONE  Take 25 mg by mouth at bedtime.   tamsulosin  0.4 MG Caps capsule Commonly known as: FLOMAX  Take 1 capsule (0.4 mg total) by mouth daily.   trospium  20 MG tablet Commonly known as: SANCTURA  Take 1 tablet (20 mg total) by mouth 2 (two) times daily.        Allergies:  Allergies  Allergen Reactions   Haloperidol  Other (See Comments)   Dilaudid  [Hydromorphone ]     Family History:  Family History  Problem Relation Age of Onset   Prostate cancer Neg Hx    Kidney disease Neg Hx     Social History:  reports that he quit smoking about 31 years ago. His smoking use included cigarettes. He started smoking about 48 years ago. He has a 51 pack-year smoking history. He has been exposed to tobacco smoke. He has never used smokeless tobacco. He reports that he does not drink alcohol and does not use drugs.  ROS: Pertinent ROS in HPI  Physical Exam: There were no vitals taken for this visit.  Constitutional:  Well nourished. Alert and oriented, No acute distress. HEENT: [place] AT, moist mucus membranes.  Trachea midline, no masses. Cardiovascular: No clubbing, cyanosis, or edema. Respiratory: Normal respiratory effort, no increased work of breathing. GI: Abdomen is soft, non tender, non distended, no abdominal masses. Liver and spleen not palpable.  No hernias appreciated.  Stool sample for occult testing is not indicated.   GU: No CVA tenderness.  No bladder fullness or masses.  Patient with circumcised/uncircumcised phallus. ***Foreskin easily retracted***  Urethral meatus is patent.  No penile discharge. No penile lesions or rashes. Scrotum without lesions, cysts, rashes and/or edema.  Testicles are located scrotally bilaterally. No masses are appreciated  in the testicles. Left and right epididymis are normal. Rectal: Patient with  normal sphincter tone. Anus and perineum without scarring or rashes. No rectal masses are appreciated. Prostate is approximately *** grams, *** nodules are appreciated. Seminal vesicles are normal. Skin: No rashes, bruises or suspicious lesions. Lymph: No cervical or inguinal adenopathy. Neurologic: Grossly intact, no focal deficits, moving all 4 extremities. Psychiatric: Normal mood and affect.  Laboratory Data: Hemoglobin A1C Order: [id number] Component Ref Range & Units 8 d ago  Hemoglobin A1C <5.7 % 6.0 High   Average Blood Glucose (Calculated From HgBA1c Level) mg/dL 401  Resulting Agency [facility]  Narrative Performed by [facility] Between 5.7% and 6.4% is suggestive of Pre-Diabetes or controlled Diabetes. Greater than or equal to 6.5% is suggestive of Diabetes, and if more than one value, diagnostic.  Accuracy may be reduced by anemia, hemoglobinopathy, recent transfusion, sickle cell, artificial heart valve, dialysis, TIPS, severe hyperglycemia, etc.  Specimen Collected: 11/25/23 15:44   Performed by: [facility] Last Resulted: 11/26/23 05:19  Received From: [facility]  Result Received: 12/03/23 11:48   Urinalysis See EPIC and HPI  I have reviewed the labs.   Pertinent Imaging: N/A  Assessment & Plan:    1. Incontinence - referral to [facility] placed  2. Microscopic hematuria - UA *** - likely secondary to infection  3. UTI - ***  No follow-ups on file.  These notes generated with voice recognition software. I apologize for typographical errors.  Briant Camper  [facility] [address]  [address] [place], [place] [address] [phone]

## 2023-12-04 ENCOUNTER — Encounter: Payer: Self-pay | Admitting: Urology

## 2023-12-04 ENCOUNTER — Ambulatory Visit (INDEPENDENT_AMBULATORY_CARE_PROVIDER_SITE_OTHER): Admitting: Urology

## 2023-12-04 VITALS — BP 117/72 | HR 73

## 2023-12-04 DIAGNOSIS — N390 Urinary tract infection, site not specified: Secondary | ICD-10-CM

## 2023-12-04 DIAGNOSIS — N3281 Overactive bladder: Secondary | ICD-10-CM | POA: Diagnosis not present

## 2023-12-04 DIAGNOSIS — N3941 Urge incontinence: Secondary | ICD-10-CM

## 2023-12-04 DIAGNOSIS — R3129 Other microscopic hematuria: Secondary | ICD-10-CM | POA: Diagnosis not present

## 2023-12-04 LAB — BLADDER SCAN AMB NON-IMAGING: Scan Result: 2

## 2023-12-04 MED ORDER — TROSPIUM CHLORIDE 20 MG PO TABS
20.0000 mg | ORAL_TABLET | Freq: Two times a day (BID) | ORAL | 3 refills | Status: DC
Start: 2023-12-04 — End: 2024-02-07

## 2023-12-04 MED ORDER — MIRABEGRON ER 50 MG PO TB24: 50.0000 mg | ORAL_TABLET | Freq: Every day | ORAL | 11 refills | Status: AC

## 2024-02-07 ENCOUNTER — Other Ambulatory Visit: Payer: Self-pay | Admitting: Urology

## 2024-02-07 DIAGNOSIS — N3281 Overactive bladder: Secondary | ICD-10-CM

## 2024-03-02 NOTE — Progress Notes (Deleted)
 03/04/2024 9:54 PM   Thomas Mcdaniel December 28, 1945 [id number]  Referring provider: Eliverto Bette Hover, MD [address] [place],  [place] [address]  Urological history: 1.  Nephrolithiasis - CTU (08/2023) - Bilateral nonobstructing renal calculi - right URS (2022)   2. 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 Balanoposthitis  4. High risk hematuria - former smoker - CTU (08/2023) bilateral nephrolithiasis - cysto (08/2023) NED  5. Incontinence - Failed anticholinergics - Failed Myrbetriq  - Failed PTNS  No chief complaint on file.  HPI: Thomas Mcdaniel is a 78 y.o. man who presents today for three month follow up with his wife, Thomas Mcdaniel.    Previous records reviewed.   I PSS ***  He reports sensation of incomplete bladder emptying, urinary frequency, urinary intermittency, urinary urgency, a weak urinary stream, having to strain to void, nocturia x ***, leaking before being able to reach the restroom, leaking with coughing, leaking without awareness, and post void dribbling.     He is wearing *** pads//depends  daily.    Patient denies any modifying or aggravating factors.  Patient denies any recent UTI's, gross hematuria, dysuria or suprapubic/flank pain.  Patient denies any fevers, chills, nausea or vomiting.  ***  He has a family history of PCa, colon cancer, ovarian cancer and/or breast cancer with ***.   He does not have a family history of PCa, colon cancer, ovarian cancer, and/or breast cancer .***     UA***  PVR***  Serum creatinine (01/2024) 1.0, eGFR 78  Diuretics:  Spironolactone  25 mg daily   Fluid consumptiom: ***  He is taking the Myrbetriq  50 mg daily and Sanctura  IR 20 mg BID.   He is also taking finasteride  5 mg daily and tamsulosin  0.4 mg daily.       PMH: Past Medical History:  Diagnosis Date   Acquired spondylolisthesis    BPH (benign prostatic hyperplasia)    Bradycardia    Burn    Bursitis    right elbow    Cancer (HCC)    skin  cancer   Complication of anesthesia    difficult to wake up after a lithotripsy   DJD (degenerative joint disease)    Foot drop    bil.   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    Nephrolithiasis    Obesity    Peripheral polyneuropathy    Personal history of PE (pulmonary embolism)    PONV (postoperative nausea and vomiting)    after cataract surgery   Pre-diabetes    Scoliosis    Sleep apnea    pt denies   SOB (shortness of breath)    Spinal stenosis, lumbar region without neurogenic claudication    Urine frequency    UTI (urinary tract infection) during pregnancy, unspecified trimester     Surgical History: Past Surgical History:  Procedure Laterality Date   CATARACT EXTRACTION     CATARACT EXTRACTION W/PHACO Right 08/25/2019   Procedure: CATARACT EXTRACTION PHACO AND INTRAOCULAR LENS PLACEMENT (IOC) MALYUGIN RIGHT 7.94  00:48.5;  Surgeon: Jaye Fallow, MD;  Location: [facility];  Service: Ophthalmology;  Laterality: Right;   COLONOSCOPY WITH PROPOFOL  N/A 08/01/2015   Procedure: COLONOSCOPY WITH PROPOFOL ;  Surgeon: Donnice Vaughn Manes, MD;  Location: [facility] ENDOSCOPY;  Service: Endoscopy;  Laterality: N/A;   COLONOSCOPY WITH PROPOFOL  N/A 09/25/2019   Procedure: COLONOSCOPY WITH PROPOFOL ;  Surgeon: Dessa Reyes ORN, MD;  Location: [facility] ENDOSCOPY;  Service: Endoscopy;  Laterality: N/A;   CORONARY PRESSURE/FFR WITH 3D MAPPING N/A 12/07/2021   Procedure: Coronary Pressure Wire/FFR w/3D Mapping;  Surgeon: Ammon Blunt, MD;  Location: [facility] INVASIVE CV LAB;  Service: Cardiovascular;  Laterality: N/A;    CYSTOSCOPY/URETEROSCOPY/HOLMIUM LASER/STENT PLACEMENT Right 12/16/2020   Procedure: CYSTOSCOPY/URETEROSCOPY/HOLMIUM LASER/STENT PLACEMENT;  Surgeon: Francisca Redell BROCKS, MD;  Location: [facility] ORS;  Service: Urology;  Laterality: Right;   DIAGNOSTIC LAPAROSCOPY     FEMORAL ARTERY REPAIR     HERNIA REPAIR     JOINT REPLACEMENT Right    knee   KNEE ARTHROPLASTY Right 12/05/2015   Procedure: COMPUTER ASSISTED TOTAL KNEE ARTHROPLASTY;  Surgeon: Lynwood SHAUNNA Hue, MD;  Location: [facility] ORS;  Service: Orthopedics;  Laterality: Right;   knee arthroscopy     LAMINECTOMY     LEFT HEART CATH AND CORONARY ANGIOGRAPHY N/A 12/07/2021   Procedure: LEFT HEART CATH AND CORONARY ANGIOGRAPHY;  Surgeon: Ammon Blunt, MD;  Location: [facility] INVAS[facility] CV LAB;  Service: Cardiovascular;  Laterality: N/A;   LITHOTRIPSY     LITHOTRIPSY     ORIF ANKLE FRACTURE Left 09/24/2022   Procedure: OPEN REDUCTION INTERNAL FIXATION (ORIF) ANKLE FRACTURE, SYNDESMOTIC FIXATION, TALUS RETROGRADE FILLING CYSTIC CHANGES WITHIN THE TALUS WITH BONE GRAFT;  Surgeon: Lennie Barter, DPM;  Location: [facility] ORS;  Service: Podiatry;  Laterality: Left;   SPINAL FUSION     TOE AMPUTATION     TONSILLECTOMY      Home Medications:  Allergies as of 03/04/2024       Reactions   Haloperidol  Other (See Comments)   Dilaudid  [hydromorphone ]         Medication List        Accurate as of March 02, 2024  9:54 PM. If you have any questions, ask your nurse or doctor.          allopurinol 300 MG tablet Commonly known as: ZYLOPRIM Take 300 mg by mouth daily as needed (gout flare).   clotrimazole-betamethasone cream Commonly known as: LOTRISONE Apply topically 2 (two) times daily.   diclofenac Sodium 1 % Gel Commonly known as: VOLTAREN Apply 2 g topically 2 (two) times daily.   donepezil 5 MG tablet Commonly known as: ARICEPT Take 5 mg by mouth at bedtime.   finasteride  5 MG tablet Commonly known as: PROSCAR  Take 1 tablet (5 mg total)  by mouth daily.   losartan  100 MG tablet Commonly known as: COZAAR  Take 100 mg by mouth at bedtime.   memantine  5 MG tablet Commonly known as: NAMENDA  Take 5 mg by mouth 2 (two) times daily.   mirabegron  ER 50 MG Tb24 tablet Commonly known as: MYRBETRIQ  Take 1 tablet (50 mg total) by mouth daily.   ondansetron  4 MG disintegrating tablet Commonly known as: ZOFRAN -ODT Take 4 mg by mouth.   rosuvastatin  5 MG tablet Commonly known as: CRESTOR  Take 5 mg by mouth daily.   senna-docusate 8.6-50 MG tablet Commonly known as: Senokot-S Take 1 tablet by mouth 2 (two) times daily. Decrease to 1x daily or hold for diarrhea/loose stools   spironolactone  25 MG tablet Commonly  known as: ALDACTONE  Take 25 mg by mouth at bedtime.   tamsulosin  0.4 MG Caps capsule Commonly known as: FLOMAX  Take 1 capsule (0.4 mg total) by mouth daily.   trospium  20 MG tablet Commonly known as: SANCTURA  TAKE 1 TABLET BY MOUTH TWICE A DAY        Allergies:  Allergies  Allergen Reactions   Haloperidol  Other (See Comments)   Dilaudid  [Hydromorphone ]     Family History: Family History  Problem Relation Age of Onset   Prostate cancer Neg Hx    Kidney disease Neg Hx     Social History:  reports that he quit smoking about 31 years ago. His smoking use included cigarettes. He started smoking about 48 years ago. He has a 51 pack-year smoking history. He has been exposed to tobacco smoke. He has never used smokeless tobacco. He reports that he does not drink alcohol and does not use drugs.  ROS: Pertinent ROS in HPI  Physical Exam: There were no vitals taken for this visit.  Constitutional:  Well nourished. Alert and oriented, No acute distress. HEENT: [place] AT, moist mucus membranes.  Trachea midline, no masses. Cardiovascular: No clubbing, cyanosis, or edema. Respiratory: Normal respiratory effort, no increased work of breathing. GI: Abdomen is soft, non tender, non distended, no abdominal masses.  Liver and spleen not palpable.  No hernias appreciated.  Stool sample for occult testing is not indicated.   GU: No CVA tenderness.  No bladder fullness or masses.  Patient with circumcised/uncircumcised phallus. ***Foreskin easily retracted***  Urethral meatus is patent.  No penile discharge. No penile lesions or rashes. Scrotum without lesions, cysts, rashes and/or edema.  Testicles are located scrotally bilaterally. No masses are appreciated in the testicles. Left and right epididymis are normal. Rectal: Patient with  normal sphincter tone. Anus and perineum without scarring or rashes. No rectal masses are appreciated. Prostate is approximately *** grams, *** nodules are appreciated. Seminal vesicles are normal. Skin: No rashes, bruises or suspicious lesions. Lymph: No cervical or inguinal adenopathy. Neurologic: Grossly intact, no focal deficits, moving all 4 extremities. Psychiatric: Normal mood and affect.   Laboratory Data: Comprehensive Metabolic Panel (CMP) Order: [id number] Component Ref Range & Units 3 wk ago  Sodium 135 - 145 mmol/L 139  Potassium 3.5 - 5.0 mmol/L 4.0  Chloride 98 - 108 mmol/L 106  Carbon Dioxide (CO2) 21 - 30 mmol/L 25  Urea Nitrogen (BUN) 7 - 20 mg/dL 11  Creatinine 0.6 - 1.3 mg/dL 1.0  Glucose 70 - 859 mg/dL 893  Comment: Interpretive Data: Above is the NONFASTING reference range.  Below are the FASTING reference ranges: NORMAL:      70-99 mg/dL PREDIABETES: 899-874 mg/dL DIABETES:    > 874 mg/dL  Calcium  8.7 - 10.2 mg/dL 9.6  AST (Aspartate Aminotransferase) 15 - 41 U/L 15  ALT (Alanine Aminotransferase) 15 - 50 U/L 10 Low   Bilirubin, Total 0.4 - 1.5 mg/dL 0.9  Alk Phos (Alkaline Phosphatase) 24 - 110 U/L 61  Albumin 3.5 - 4.8 g/dL 3.7  Protein, Total 6.2 - 8.1 g/dL 6.6  Anion Gap 3 - 12 mmol/L 8  BUN/CREA Ratio 6 - 27 12  Glomerular Filtration Rate (eGFR) mL/min/1.73sq m 78  Comment: CKD-EPI (2021) does not include patient's race  in the calculation of eGFR. Monitoring changes of plasma creatinine and eGFR over time is useful for monitoring kidney function.  This change was made on 08/23/2020.  Interpretive Ranges for eGFR(CKD-EPI 2021):  eGFR:              >  60 mL/min/1.73 sq m - Normal eGFR:              30 - 59 mL/min/1.73 sq m - Moderately Decreased eGFR:              15 - 29 mL/min/1.73 sq m - Severely Decreased eGFR:              < 15 mL/min/1.73 sq m -  Kidney Failure   Note: These eGFR calculations do not apply in acute situations when eGFR is changing rapidly or in patients on dialysis.  Resulting Agency [facility]   Specimen Collected: 02/04/24 11:23   Performed by: [facility] Last Resulted: 02/04/24 18:20  Received From: [facility]  Result Received: 03/02/24 21:53   Urinalysis *** I have reviewed the labs.   Pertinent Imaging: *** Assessment & Plan:    1. Incontinence - referral to [facility] placed, waiting for appointment*** - He will continue with Sanctura  IR 20 mg twice daily and the Myrbetriq  50 mg daily*** - In regards to the constipation, I stated it was a side effect of the Sanctura  medication and to start taking MiraLAX, which he has on hand in the follow-up as scheduled with his PCP***  2. Microscopic hematuria - UA - he was not able to give a sample today, we will check it when he returns in 3 months - likely secondary to infection  3. UTI - resolved - Recheck UA in 3 months  No follow-ups on file.  These notes generated with voice recognition software. I apologize for typographical errors.  CLOTILDA HELON RIGGERS  [facility] [address]  [address] [place], [place] [address] [phone]

## 2024-03-04 ENCOUNTER — Ambulatory Visit: Admitting: Urology

## 2024-03-04 DIAGNOSIS — N3941 Urge incontinence: Secondary | ICD-10-CM

## 2024-03-25 NOTE — Progress Notes (Unsigned)
 03/26/2024 4:04 PM   Thomas Mcdaniel 1945-10-22 [id number]  Referring provider: Eliverto Bette Hover, MD [address] [place],  [place] [address]  Urological history: 1.  Nephrolithiasis - CTU (08/2023) - Bilateral nonobstructing renal calculi - right URS (2022)   2. 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 Balanoposthitis  4. High risk hematuria - former smoker - CTU (08/2023) bilateral nephrolithiasis - cysto (08/2023) NED  5. Incontinence - Failed anticholinergics - Failed Myrbetriq  - Failed PTNS  Chief Complaint  Patient presents with   Urinary Incontinence   HPI: Thomas Mcdaniel is a 78 y.o. man who presents today for three month follow up with his wife, Thomas Mcdaniel.    Previous records reviewed.   He is having 1 to 7 daytime voids, nocturia x 1-2, 3 or more episodes of leakage weekly, he does not wear anything for urinary leakage, he does not limit fluid intake.  He does not engage in toilet mapping.  Patient denies any modifying or aggravating factors.  Patient denies any recent UTI's, gross hematuria, dysuria or suprapubic/flank pain.  Patient denies any fevers, chills, nausea or vomiting.    UA -unfortunately he spilled his urine, so we could not get a sample today   PVR 13 mL   Serum creatinine (01/2024) 1.0, eGFR 78  Diuretics:  Spironolactone  25 mg daily   Fluid consumptiom: Drinks a lot of [employer]epsi's - 4 to 5 cans daily   He is taking the Myrbetriq  50 mg daily and Sanctura  IR 20 mg BID.   His wife will discontinue the Sanctura  when he is not drinking a lot of Pepsi's as the incontinence is less frequent.  He is also taking finasteride  5 mg daily and tamsulosin  0.4 mg daily.     PMH: Past Medical History:  Diagnosis Date   Acquired spondylolisthesis    BPH (benign prostatic hyperplasia)    Bradycardia    Burn    Bursitis    right elbow    Cancer (HCC)    skin cancer   Complication of anesthesia    difficult to wake up after a lithotripsy   DJD  (degenerative joint disease)    Foot drop    bil.   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    Nephrolithiasis    Obesity    Peripheral polyneuropathy    Personal history of PE (pulmonary embolism)    PONV (postoperative nausea and vomiting)    after cataract surgery   Pre-diabetes    Scoliosis    Sleep apnea    pt denies   SOB (shortness of breath)    Spinal stenosis, lumbar region without neurogenic claudication    Urine frequency    UTI (urinary tract infection) during pregnancy, unspecified trimester     Surgical History: Past Surgical History:  Procedure Laterality Date   CATARACT EXTRACTION     CATARACT EXTRACTION W/PHACO Right 08/25/2019   Procedure: CATARACT EXTRACTION PHACO AND INTRAOCULAR LENS PLACEMENT (IOC) MALYUGIN RIGHT 7.94  00:48.5;  Surgeon: Jaye Fallow,  MD;  Location: [facility];  Service: Ophthalmology;  Laterality: Right;   COLONOSCOPY WITH PROPOFOL  N/A 08/01/2015   Procedure: COLONOSCOPY WITH PROPOFOL ;  Surgeon: Donnice Vaughn Manes, MD;  Location: [facility] ENDOSCOPY;  Service: Endoscopy;  Laterality: N/A;   COLONOSCOPY WITH PROPOFOL  N/A 09/25/2019   Procedure: COLONOSCOPY WITH PROPOFOL ;  Surgeon: Dessa Reyes ORN, MD;  Location: [facility] ENDOSCOPY;  Service: Endoscopy;  Laterality: N/A;   CORONARY PRESSURE/FFR WITH 3D MAPPING N/A 12/07/2021   Procedure: Coronary Pressure Wire/FFR w/3D Mapping;  Surgeon: Ammon Blunt, MD;  Location: [facility] CV LAB;  Service: Cardiovascular;  Laterality: N/A;   CYSTOSCOPY/URETEROSCOPY/HOLMIUM LASER/STENT PLACEMENT Right 12/16/2020   Procedure:  CYSTOSCOPY/URETEROSCOPY/HOLMIUM LASER/STENT PLACEMENT;  Surgeon: Francisca Redell BROCKS, MD;  Location: [facility] OR[facility];  Service: Urology;  Laterality: Right;   DIAGNOSTIC LAPAROSCOPY     FEMORAL ARTERY REPAIR     HERNIA REPAIR     JOINT REPLACEMENT Right    knee   KNEE ARTHROPLASTY Right 12/05/2015   Procedure: COMPUTER ASSISTED TOTAL KNEE ARTHROPLASTY;  Surgeon: Lynwood SHAUNNA Hue, MD;  Location: [facility];  Service: Orthopedics;  Laterality: Right;   knee arthroscopy     LAMINECTOMY     LEFT HEART CATH AND CORONARY ANGIOGRAPHY N/A 12/07/2021   Procedure: LEFT HEART CATH AND CORONARY ANGIOGRAPHY;  Surgeon: Ammon Blunt, MD;  Location: [facility];  Service: Cardiovascular;  Laterality: N/A;   LITHOTRIPSY     LITHOTRIPSY     ORIF ANKLE FRACTURE Left 09/24/2022   Procedure: OPEN REDUCTION INTERNAL FIXATION (ORIF) ANKLE FRACTURE, SYNDESMOTIC FIXATION, TALUS RETROGRADE FILLING CYSTIC CHANGES WITHIN THE TALUS WITH BONE GRAFT;  Surgeon: Lennie Barter, DPM;  Location: [facility] OR[facility];  Service: Podiatry;  Laterality: Left;   SPINAL FUSION     TOE AMPUTATION     TONSILLECTOMY      Home Medications:  Allergies as of 03/26/2024       Reactions   Haloperidol  Other (See Comments)   Dilaudid  [hydromorphone ]         Medication List        Accurate as of March 26, 2024  4:04 PM. If you have any questions, ask your nurse or doctor.          allopurinol 300 MG tablet Commonly known as: ZYLOPRIM Take 300 mg by mouth daily as needed (gout flare).   clotrimazole-betamethasone cream Commonly known as: LOTRISONE Apply topically 2 (two) times daily.   diclofenac Sodium 1 % Gel Commonly known as: VOLTAREN Apply 2 g topically 2 (two) times daily.   donepezil 5 MG tablet Commonly known as: ARICEPT Take 5 mg by mouth at bedtime.   finasteride  5 MG tablet Commonly known as: PROSCAR  Take 1 tablet (5 mg total) by mouth daily.   losartan  100 MG tablet Commonly known as: COZAAR  Take 100 mg by  mouth at bedtime.   memantine  5 MG tablet Commonly known as: NAMENDA  Take 5 mg by mouth 2 (two) times daily.   mirabegron  ER 50 MG Tb24 tablet Commonly known as: MYRBETRIQ  Take 1 tablet (50 mg total) by mouth daily.   ondansetron  4 MG disintegrating tablet Commonly known as: ZOFRAN -ODT Take 4 mg by mouth.   rosuvastatin  5 MG tablet Commonly known as: CRESTOR  Take 5 mg by mouth daily.   senna-docusate 8.6-50 MG tablet Commonly known as: Senokot-S Take 1 tablet by mouth 2 (two) times daily. Decrease to 1x daily or hold for diarrhea/loose stools   spironolactone  25 MG tablet Commonly known as: ALDACTONE  Take 25  mg by mouth at bedtime.   tamsulosin  0.4 MG Caps capsule Commonly known as: FLOMAX  Take 1 capsule (0.4 mg total) by mouth daily.   trospium  20 MG tablet Commonly known as: SANCTURA  Take 1 tablet (20 mg total) by mouth 2 (two) times daily.        Allergies:  Allergies  Allergen Reactions   Haloperidol  Other (See Comments)   Dilaudid  [Hydromorphone ]     Family History: Family History  Problem Relation Age of Onset   Prostate cancer Neg Hx    Kidney disease Neg Hx     Social History:  reports that he quit smoking about 31 years ago. His smoking use included cigarettes. He started smoking about 48 years ago. He has a 51 pack-year smoking history. He has been exposed to tobacco smoke. He has never used smokeless tobacco. He reports that he does not drink alcohol and does not use drugs.  ROS: Pertinent ROS in HPI  Physical Exam: BP 123/70   Pulse 70   Ht 5' 9 (1.753 m)   Wt 245 lb (111.1 kg)   BMI 36.18 kg/m   Constitutional:  Well nourished. Alert and oriented, No acute distress. HEENT: [place] AT, moist mucus membranes.  Trachea midline Cardiovascular: No clubbing, cyanosis, or edema. Respiratory: Normal respiratory effort, no increased work of breathing. Neurologic: Grossly intact, no focal deficits, moving all 4 extremities. Psychiatric: Normal mood  and affect.   Laboratory Data: Comprehensive Metabolic Panel (CMP) Order: [id number] Component Ref Range & Units 3 wk ago  Sodium 135 - 145 mmol/L 139  Potassium 3.5 - 5.0 mmol/L 4.0  Chloride 98 - 108 mmol/L 106  Carbon Dioxide (CO2) 21 - 30 mmol/L 25  Urea Nitrogen (BUN) 7 - 20 mg/dL 11  Creatinine 0.6 - 1.3 mg/dL 1.0  Glucose 70 - 859 mg/dL 893  Comment: Interpretive Data: Above is the NONFASTING reference range.  Below are the FASTING reference ranges: NORMAL:      70-99 mg/dL PREDIABETES: 899-874 mg/dL DIABETES:    > 874 mg/dL  Calcium  8.7 - 10.2 mg/dL 9.6  AST (Aspartate Aminotransferase) 15 - 41 U/L 15  ALT (Alanine Aminotransferase) 15 - 50 U/L 10 Low   Bilirubin, Total 0.4 - 1.5 mg/dL 0.9  Alk Phos (Alkaline Phosphatase) 24 - 110 U/L 61  Albumin 3.5 - 4.8 g/dL 3.7  Protein, Total 6.2 - 8.1 g/dL 6.6  Anion Gap 3 - 12 mmol/L 8  BUN/CREA Ratio 6 - 27 12  Glomerular Filtration Rate (eGFR) mL/min/1.73sq m 78  Comment: CKD-EPI (2021) does not include patient's race in the calculation of eGFR. Monitoring changes of plasma creatinine and eGFR over time is useful for monitoring kidney function.  This change was made on 08/23/2020.  Interpretive Ranges for eGFR(CKD-EPI 2021):  eGFR:              > 60 mL/min/1.73 sq m - Normal eGFR:              30 - 59 mL/min/1.73 sq m - Moderately Decreased eGFR:              15 - 29 mL/min/1.73 sq m - Severely Decreased eGFR:              < 15 mL/min/1.73 sq m -  Kidney Failure   Note: These eGFR calculations do not apply in acute situations when eGFR is changing rapidly or in patients on dialysis.  Resulting Agency [facility]   Specimen Collected: 02/04/24  11:23   Performed by: [facility] Last Resulted: 02/04/24 18:20  Received From: [facility]  Result Received: 03/02/24 21:53   I have reviewed the labs.   Pertinent Imaging:  03/26/24 15:28  Scan Result  13ml   Assessment & Plan:    1. Incontinence - He will continue with Sanctura  IR 20 mg twice daily and the Myrbetriq  50 mg daily - He takes the trospium  on a as needed basis more or less, so there is less constipation  2. Microscopic hematuria - UA - he spilled his sample - no complaints of gross hematuria  3. UTI - resolved  Return if symptoms worsen or fail to improve, for Patient wants to follow up prn .  These notes generated with voice recognition software. I apologize for typographical errors.  Thomas Mcdaniel  [facility] [address]  [address] [place], [place] [address] [phone]

## 2024-03-26 ENCOUNTER — Ambulatory Visit (INDEPENDENT_AMBULATORY_CARE_PROVIDER_SITE_OTHER): Admitting: Urology

## 2024-03-26 ENCOUNTER — Encounter: Payer: Self-pay | Admitting: Urology

## 2024-03-26 VITALS — BP 123/70 | HR 70 | Ht 69.0 in | Wt 245.0 lb

## 2024-03-26 DIAGNOSIS — N3281 Overactive bladder: Secondary | ICD-10-CM | POA: Diagnosis not present

## 2024-03-26 LAB — BLADDER SCAN AMB NON-IMAGING

## 2024-03-26 MED ORDER — TROSPIUM CHLORIDE 20 MG PO TABS: 20.0000 mg | ORAL_TABLET | Freq: Two times a day (BID) | ORAL | 3 refills | Status: AC

## 2024-03-26 NOTE — Addendum Note (Signed)
 Addended by: DEBBY LAYMON HERO on: 03/26/2024 04:13 PM   Modules accepted: Orders

## 2024-06-25 ENCOUNTER — Emergency Department

## 2024-06-25 ENCOUNTER — Emergency Department: Admission: EM | Admit: 2024-06-25 | Discharge: 2024-06-26 | Disposition: A | Discharge status: Home / Self Care

## 2024-06-25 ENCOUNTER — Other Ambulatory Visit: Payer: Self-pay

## 2024-06-25 DIAGNOSIS — R4182 Altered mental status, unspecified: Secondary | ICD-10-CM | POA: Insufficient documentation

## 2024-06-25 DIAGNOSIS — I700 Atherosclerosis of aorta: Secondary | ICD-10-CM | POA: Diagnosis not present

## 2024-06-25 DIAGNOSIS — F0390 Unspecified dementia without behavioral disturbance: Secondary | ICD-10-CM | POA: Insufficient documentation

## 2024-06-25 DIAGNOSIS — Z993 Dependence on wheelchair: Secondary | ICD-10-CM | POA: Insufficient documentation

## 2024-06-25 DIAGNOSIS — R410 Disorientation, unspecified: Secondary | ICD-10-CM | POA: Insufficient documentation

## 2024-06-25 DIAGNOSIS — R9082 White matter disease, unspecified: Secondary | ICD-10-CM | POA: Diagnosis not present

## 2024-06-25 DIAGNOSIS — R0603 Acute respiratory distress: Secondary | ICD-10-CM | POA: Insufficient documentation

## 2024-06-25 DIAGNOSIS — F03918 Unspecified dementia, unspecified severity, with other behavioral disturbance: Secondary | ICD-10-CM | POA: Insufficient documentation

## 2024-06-25 DIAGNOSIS — R531 Weakness: Secondary | ICD-10-CM | POA: Insufficient documentation

## 2024-06-25 DIAGNOSIS — J101 Influenza due to other identified influenza virus with other respiratory manifestations: Secondary | ICD-10-CM | POA: Insufficient documentation

## 2024-06-25 LAB — COMPREHENSIVE METABOLIC PANEL WITH GFR
ALT: 20 U/L (ref 0–44)
AST: 39 U/L (ref 15–41)
Albumin: 4.2 g/dL (ref 3.5–5.0)
Alkaline Phosphatase: 71 U/L (ref 38–126)
Anion gap: 14 (ref 5–15)
BUN: 23 mg/dL (ref 8–23)
CO2: 24 mmol/L (ref 22–32)
Calcium: 10.1 mg/dL (ref 8.9–10.3)
Chloride: 97 mmol/L — ABNORMAL LOW (ref 98–111)
Creatinine, Ser: 1.07 mg/dL (ref 0.61–1.24)
GFR, Estimated: >60 mL/min
Glucose, Bld: 116 mg/dL — ABNORMAL HIGH (ref 70–99)
Potassium: 3.7 mmol/L (ref 3.5–5.1)
Sodium: 135 mmol/L (ref 135–145)
Total Bilirubin: 0.6 mg/dL (ref 0.0–1.2)
Total Protein: 7 g/dL (ref 6.5–8.1)

## 2024-06-25 LAB — URINALYSIS, COMPLETE (UACMP) WITH MICROSCOPIC
Bilirubin Urine: NEGATIVE
Glucose, UA: NEGATIVE mg/dL
Ketones, ur: NEGATIVE mg/dL
Nitrite: NEGATIVE
Protein, ur: NEGATIVE mg/dL
Specific Gravity, Urine: 1.021 (ref 1.005–1.030)
pH: 5 (ref 5.0–8.0)

## 2024-06-25 LAB — CBC WITH DIFFERENTIAL/PLATELET
Abs Immature Granulocytes: 0.04 K/uL (ref 0.00–0.07)
Basophils Absolute: 0 K/uL (ref 0.0–0.1)
Basophils Relative: 0 %
Eosinophils Absolute: 0 K/uL (ref 0.0–0.5)
Eosinophils Relative: 0 %
HCT: 44.6 % (ref 39.0–52.0)
Hemoglobin: 15.4 g/dL (ref 13.0–17.0)
Immature Granulocytes: 1 %
Lymphocytes Relative: 15 %
Lymphs Abs: 1.1 K/uL (ref 0.7–4.0)
MCH: 31.6 pg (ref 26.0–34.0)
MCHC: 34.5 g/dL (ref 30.0–36.0)
MCV: 91.6 fL (ref 80.0–100.0)
Monocytes Absolute: 0.7 K/uL (ref 0.1–1.0)
Monocytes Relative: 10 %
Neutro Abs: 5.1 K/uL (ref 1.7–7.7)
Neutrophils Relative %: 74 %
Platelets: 123 K/uL — ABNORMAL LOW (ref 150–400)
RBC: 4.87 MIL/uL (ref 4.22–5.81)
RDW: 14 % (ref 11.5–15.5)
WBC: 7 K/uL (ref 4.0–10.5)
nRBC: 0 % (ref 0.0–0.2)

## 2024-06-25 LAB — RESP PANEL BY RT-PCR (RSV, FLU A&B, COVID)  RVPGX2
Influenza A by PCR: POSITIVE — AB
Influenza B by PCR: NEGATIVE
Resp Syncytial Virus by PCR: NEGATIVE
SARS Coronavirus 2 by RT PCR: NEGATIVE

## 2024-06-25 LAB — LACTIC ACID, PLASMA: Lactic Acid, Venous: 1.2 mmol/L (ref 0.5–1.9)

## 2024-06-25 LAB — TROPONIN T, HIGH SENSITIVITY: Troponin T High Sensitivity: 21 ng/L — ABNORMAL HIGH (ref 0–19)

## 2024-06-25 LAB — PRO BRAIN NATRIURETIC PEPTIDE: Pro Brain Natriuretic Peptide: 124 pg/mL

## 2024-06-25 LAB — PROCALCITONIN: Procalcitonin: <0.1 ng/mL

## 2024-06-25 MED ORDER — OSELTAMIVIR PHOSPHATE 75 MG PO CAPS
75.0000 mg | ORAL_CAPSULE | Freq: Once | ORAL | Status: AC
  Administered 2024-06-25: 75 mg via ORAL
  Filled 2024-06-25: qty 1

## 2024-06-25 MED ORDER — OSELTAMIVIR PHOSPHATE 75 MG PO CAPS: 75.0000 mg | ORAL_CAPSULE | Freq: Two times a day (BID) | ORAL | 0 refills | Status: AC

## 2024-06-25 MED ORDER — SODIUM CHLORIDE 0.9 % IV BOLUS
500.0000 mL | Freq: Once | INTRAVENOUS | Status: AC
  Administered 2024-06-25: 500 mL via INTRAVENOUS

## 2024-06-25 MED ORDER — ONDANSETRON 4 MG PO TBDP: 4.0000 mg | ORAL_TABLET | Freq: Three times a day (TID) | ORAL | 0 refills | Status: AC | PRN

## 2024-06-25 NOTE — Discharge Instructions (Signed)
 You were seen in the emergency department for transient confusion.  Although I do think this may be secondary to missing some of your dementia medication you were also found to be influenza A positive.  Your next dose of Tamiflu will be due tomorrow morning.  Please ensure adequate hydration (fluids are more important than solids).  Please follow-up with your primary care physician and return with any acutely worsening symptoms or any other emergency. -- RETURN PRECAUTIONS & AFTERCARE: (ENGLISH) RETURN PRECAUTIONS: Return immediately to the emergency department or see/call your doctor if you feel worse, weak or have changes in speech or vision, are short of breath, have fever, vomiting, pain, bleeding or dark stool, trouble urinating or any new issues. Return here or see/call your doctor if not improving as expected for your suspected condition. FOLLOW-UP CARE: Call your doctor and/or any doctors we referred you to for more advice and to make an appointment. Do this today, tomorrow or after the weekend. Some doctors only take PPO insurance so if you have HMO insurance you may want to contact your HMO or your regular doctor for referral to a specialist within your plan. Either way tell the doctor's office that it was a referral from the emergency department so you get the soonest possible appointment.  YOUR TEST RESULTS: Take result reports of any blood or urine tests, imaging tests and EKG's to your doctor and any referral doctor. Have any abnormal tests repeated. Your doctor or a referral doctor can let you know when this should be done. Also make sure your doctor contacts this hospital to get any test results that are not currently available such as cultures or special tests for infection and final imaging reports, which are often not available at the time you leave the ER but which may list additional important findings that are not documented on the preliminary report. BLOOD PRESSURE: If your blood  pressure was greater than 120/80 have your blood pressure rechecked within 1 to 2 weeks. MEDICATION SIDE EFFECTS: Do not drive, walk, bike, take the bus, etc. if you have received or are being prescribed any sedating medications such as those for pain or anxiety or certain antihistamines like Benadryl . If you have been give one of these here get a taxi home or have a friend drive you home. Ask your pharmacist to counsel you on potential side effects of any new medication

## 2024-06-25 NOTE — ED Provider Notes (Signed)
 "  [facility] Provider Note    Event Date/Time   First MD Initiated Contact with Patient 06/25/24 2131     (approximate)   History   Weakness   HPI  Thomas Mcdaniel is a 79 y.o. male with dementia with behavioral issues, BPH, osteoarthritis, who presents to the emergency department with behavioral disturbance and increased confusion.  History was initially obtained from EMS and subsequently obtained by the patient's wife who arrived at bedside 30 minutes later.  Patient has missed several doses of his dementia medications recently for unclear reasons.  His wife has noticed that today he has been acting oddly like going outside (patient is wheelchair-bound) without a jacket and not eating or drinking his food.  She is worried about dehydration.  She has not witnessed any facial droop sensation changes or extremity weakness.  However given his acute change she called EMS because she was concerned about a stroke.  On stroke arrival patient's glucose was not low and he did not have any positive findings on the stroke screen.  They did notice that he had some increased work of breathing and rate but was satting well on room air.      Physical Exam   Triage Vital Signs: ED Triage Vitals  Encounter Vitals Group     BP      Girls Systolic BP Percentile      Girls Diastolic BP Percentile      Boys Systolic BP Percentile      Boys Diastolic BP Percentile      Pulse      Resp      Temp      Temp src      SpO2      Weight      Height      Head Circumference      Peak Flow      Pain Score      Pain Loc      Pain Education      Exclude from Growth Chart     Most recent vital signs: Vitals:   06/25/24 2233 06/25/24 2237  BP: 125/73   Pulse: 82 89  Resp: (!) 26 (!) 23  Temp:    SpO2: 90% 90%    Nursing Triage Note reviewed. Vital signs reviewed and patients oxygen saturation is normoxic  General: Patient is well nourished, well developed, awake and  alert, resting comfortably in no acute distress Head: Normocephalic and atraumatic Eyes: Normal inspection, extraocular muscles intact, no conjunctival pallor Ear, nose, throat: Normal external exam Neck: Normal range of motion Respiratory: Patient is in mild respiratory distress, lungs CTAB, but patient does have some accessory muscle use Cardiovascular: Patient is not tachycardic, RR GI: Abd SNT with no guarding or rebound  Back: Normal inspection of the back with good strength and range of motion throughout all ext Extremities: pulses intact with good cap refills, no LE pitting edema or calf tenderness Neuro: The patient is alert and oriented to person, place, not time, easily confused.  with 5/5 bilat UE/LE strength, no gross motor or sensory defects noted. Coordination appears to be adequate. Skin: Warm, dry, and intact   ED Results / Procedures / Treatments   Labs (all labs ordered are listed, but only abnormal results are displayed) Labs Reviewed  RESP PANEL BY RT-PCR (RSV, FLU A&B, COVID)  RVPGX2 - Abnormal; Notable for the following components:      Result Value   Influenza A by  PCR POSITIVE (*)    All other components within normal limits  CBC WITH DIFFERENTIAL/PLATELET - Abnormal; Notable for the following components:   Platelets 123 (*)    All other components within normal limits  URINALYSIS, COMPLETE (UACMP) WITH MICROSCOPIC - Abnormal; Notable for the following components:   Color, Urine YELLOW (*)    APPearance CLEAR (*)    Hgb urine dipstick MODERATE (*)    Leukocytes,Ua TRACE (*)    Bacteria, UA RARE (*)    All other components within normal limits  COMPREHENSIVE METABOLIC PANEL WITH GFR - Abnormal; Notable for the following components:   Chloride 97 (*)    Glucose, Bld 116 (*)    All other components within normal limits  TROPONIN T, HIGH SENSITIVITY - Abnormal; Notable for the following components:   Troponin T High Sensitivity 21 (*)    All other components  within normal limits  PROCALCITONIN  LACTIC ACID, PLASMA  PRO BRAIN NATRIURETIC PEPTIDE     EKG EKG and rhythm strip are interpreted by myself:   EKG: [Normal sinus rhythm] at heart rate of 90, normal QRS duration, QTc 424, nonspecific ST segments and T waves no ectopy EKG not consistent with Acute STEMI Rhythm strip: NSR in lead II   RADIOLOGY CT head: No intracranial hemorrhage or profound ventriculomegaly on my independent review interpretation and radiologist read pending No acute abnormality    PROCEDURES:  Critical Care performed: No  Procedures   MEDICATIONS ORDERED IN ED: Medications  oseltamivir (TAMIFLU) capsule 75 mg (has no administration in time range)  sodium chloride  0.9 % bolus 500 mL (500 mLs Intravenous New Bag/Given 06/25/24 2237)     IMPRESSION / MDM / ASSESSMENT AND PLAN / ED COURSE                                Differential diagnosis includes, but is not limited to, UTI, electrolyte derangement anemia, sepsis, normal pressure hydrocephalus intracranial hemorrhage, URI, pneumonia  ED course: Patient is well-appearing and satting well on room air.  Chest x-ray was obtained given the temporary accessory muscle use which demonstrated no pneumonia.  He had no profound electrolyte derangements no acute renal insufficiency, no leukocytosis and no elevation of his procalcitonin or lactic acid.  His influenza A test did return positive and given his comorbidities age and timeline of symptoms I will initiate Tamiflu.  He has a normal GFR and consequently was given the full dose.  Prescription sent to his pharmacy of choice.  His CT head is read as normal patient's wife feels comfortable taking the patient home   Clinical Course as of 06/25/24 2339  Thu Jun 25, 2024  2254 Urinalysis, Complete w Microscopic -Urine, Clean Catch(!) I do not think this is consistent with UTI [HD]  2254 Influenza A By PCR(!): POSITIVE He is influenza positive [HD]  2305  Procalcitonin: <0.10 Not elevated [HD]  2305 Lactic Acid, Venous: 1.2 Not elevated [HD]  2305 CBC with Differential(!) No acute anemia or leukocytosis [HD]  2305 Comprehensive metabolic panel(!) No acute renal insufficiency or electrolyte derangements [HD]  2336 CT Head Wo Contrast No acute abnormality will discharge to patient [HD]    Clinical Course User Index [HD] Nicholaus Rolland BRAVO, MD   At time of discharge there is no evidence of acute life, limb, vision, or fertility threat. Patient has stable vital signs, pain is well controlled and p.o. tolerant.  Discharge instructions  were completed using the EPIC system. I would refer you to those at this time. All warnings prescriptions follow-up etc. were discussed in detail with the patient. Patient indicates understanding and is agreeable with this plan. All questions answered.  Patient is made aware that they may return to the emergency department for any worsening or new condition or for any other emergency.  -- Risk: 5 This patient has a high risk of morbidity due to further diagnostic testing or treatment. Rationale: This patients evaluation and management involve a high risk of morbidity due to the potential severity of presenting symptoms, need for diagnostic testing, and/or initiation of treatment that may require close monitoring. The differential includes conditions with potential for significant deterioration or requiring escalation of care. Treatment decisions in the ED, including medication administration, procedural interventions, or disposition planning, reflect this level of risk. COPA: 5 The patient has the following acute or chronic illness/injury that poses a possible threat to life or bodily function: [X] : The patient has a potentially serious acute condition or an acute exacerbation of a chronic illness requiring urgent evaluation and management in the Emergency Department. The clinical presentation necessitates immediate  consideration of life-threatening or function-threatening diagnoses, even if they are ultimately ruled out.   FINAL CLINICAL IMPRESSION(S) / ED DIAGNOSES   Final diagnoses:  Weakness  Transient confusion  Influenza A     Rx / DC Orders   ED Discharge Orders          Ordered    oseltamivir (TAMIFLU) 75 MG capsule  2 times daily        06/25/24 2258    ondansetron  (ZOFRAN -ODT) 4 MG disintegrating tablet  Every 8 hours PRN        06/25/24 2330             Note:  This document was prepared using Dragon voice recognition software and may include unintentional dictation errors.   Nicholaus Rolland BRAVO, MD 06/25/24 [phone]  "

## 2024-06-25 NOTE — ED Triage Notes (Signed)
 Pt is coming in for reported AMS and general confusion that has been going on more so today, they mention in the afternoon he was walking around doing things he normally does not do. He has no unliateral weakness or drop. He does have dementia and has not taken his medication for that in 2 days. He is otherwise stable at this time with a reported cough and nasal congestion.

## 2024-07-28 ENCOUNTER — Encounter: Payer: Self-pay | Admitting: Urology
